# Patient Record
Sex: Female | Born: 1955 | Race: White | Hispanic: No | Marital: Single | Smoking: Former smoker
Health system: Southern US, Community
[De-identification: ages and names within clinical notes are randomized; demographics above are authoritative.]

## PROBLEM LIST (undated history)

## (undated) DIAGNOSIS — F419 Anxiety disorder, unspecified: Secondary | ICD-10-CM

## (undated) DIAGNOSIS — M1990 Unspecified osteoarthritis, unspecified site: Secondary | ICD-10-CM

## (undated) DIAGNOSIS — R519 Headache, unspecified: Secondary | ICD-10-CM

## (undated) DIAGNOSIS — E7800 Pure hypercholesterolemia, unspecified: Secondary | ICD-10-CM

## (undated) DIAGNOSIS — Z8739 Personal history of other diseases of the musculoskeletal system and connective tissue: Secondary | ICD-10-CM

## (undated) DIAGNOSIS — R232 Flushing: Secondary | ICD-10-CM

## (undated) DIAGNOSIS — G8929 Other chronic pain: Secondary | ICD-10-CM

## (undated) DIAGNOSIS — R51 Headache: Secondary | ICD-10-CM

## (undated) DIAGNOSIS — R079 Chest pain, unspecified: Secondary | ICD-10-CM

## (undated) DIAGNOSIS — C801 Malignant (primary) neoplasm, unspecified: Secondary | ICD-10-CM

## (undated) DIAGNOSIS — F32A Depression, unspecified: Secondary | ICD-10-CM

## (undated) DIAGNOSIS — I219 Acute myocardial infarction, unspecified: Secondary | ICD-10-CM

## (undated) DIAGNOSIS — K219 Gastro-esophageal reflux disease without esophagitis: Secondary | ICD-10-CM

## (undated) DIAGNOSIS — D649 Anemia, unspecified: Secondary | ICD-10-CM

## (undated) DIAGNOSIS — F329 Major depressive disorder, single episode, unspecified: Secondary | ICD-10-CM

## (undated) DIAGNOSIS — M545 Low back pain, unspecified: Secondary | ICD-10-CM

## (undated) DIAGNOSIS — Z720 Tobacco use: Secondary | ICD-10-CM

## (undated) HISTORY — DX: Depression, unspecified: F32.A

## (undated) HISTORY — DX: Flushing: R23.2

## (undated) HISTORY — DX: Major depressive disorder, single episode, unspecified: F32.9

## (undated) HISTORY — DX: Anxiety disorder, unspecified: F41.9

## (undated) HISTORY — DX: Gastro-esophageal reflux disease without esophagitis: K21.9

## (undated) HISTORY — PX: TONSILLECTOMY: SUR1361

## (undated) HISTORY — DX: Pure hypercholesterolemia, unspecified: E78.00

## (undated) HISTORY — PX: OVARIAN CYST REMOVAL: SHX89

---

## 1999-10-01 ENCOUNTER — Other Ambulatory Visit: Admission: RE | Admit: 1999-10-01 | Discharge: 1999-10-01 | Payer: Self-pay | Admitting: Gynecology

## 1999-10-29 ENCOUNTER — Encounter: Payer: Self-pay | Admitting: Surgery

## 1999-10-30 ENCOUNTER — Encounter: Admission: RE | Admit: 1999-10-30 | Discharge: 1999-10-30 | Payer: Self-pay | Admitting: Surgery

## 1999-10-30 ENCOUNTER — Ambulatory Visit (HOSPITAL_COMMUNITY): Admission: RE | Admit: 1999-10-30 | Discharge: 1999-10-30 | Payer: Self-pay | Admitting: Surgery

## 1999-10-30 ENCOUNTER — Encounter: Payer: Self-pay | Admitting: Surgery

## 1999-10-30 ENCOUNTER — Other Ambulatory Visit: Admission: RE | Admit: 1999-10-30 | Discharge: 1999-10-30 | Payer: Self-pay | Admitting: Surgery

## 1999-10-30 ENCOUNTER — Encounter (INDEPENDENT_AMBULATORY_CARE_PROVIDER_SITE_OTHER): Payer: Self-pay | Admitting: Specialist

## 1999-12-30 ENCOUNTER — Ambulatory Visit (HOSPITAL_COMMUNITY): Admission: RE | Admit: 1999-12-30 | Discharge: 1999-12-30 | Payer: Self-pay | Admitting: Gastroenterology

## 2000-01-19 ENCOUNTER — Ambulatory Visit (HOSPITAL_COMMUNITY): Admission: RE | Admit: 2000-01-19 | Discharge: 2000-01-19 | Payer: Self-pay | Admitting: Gynecology

## 2000-01-19 ENCOUNTER — Encounter (INDEPENDENT_AMBULATORY_CARE_PROVIDER_SITE_OTHER): Payer: Self-pay | Admitting: Specialist

## 2000-11-02 ENCOUNTER — Encounter: Payer: Self-pay | Admitting: Surgery

## 2000-11-02 ENCOUNTER — Encounter: Admission: RE | Admit: 2000-11-02 | Discharge: 2000-11-02 | Payer: Self-pay | Admitting: Surgery

## 2002-08-02 ENCOUNTER — Other Ambulatory Visit: Admission: RE | Admit: 2002-08-02 | Discharge: 2002-08-02 | Payer: Self-pay | Admitting: Gynecology

## 2003-08-02 ENCOUNTER — Encounter: Admission: RE | Admit: 2003-08-02 | Discharge: 2003-08-02 | Payer: Self-pay | Admitting: Gynecology

## 2003-08-06 ENCOUNTER — Other Ambulatory Visit: Admission: RE | Admit: 2003-08-06 | Discharge: 2003-08-06 | Payer: Self-pay | Admitting: Gynecology

## 2004-01-29 ENCOUNTER — Other Ambulatory Visit: Admission: RE | Admit: 2004-01-29 | Discharge: 2004-01-29 | Payer: Self-pay | Admitting: Gynecology

## 2004-08-07 ENCOUNTER — Other Ambulatory Visit: Admission: RE | Admit: 2004-08-07 | Discharge: 2004-08-07 | Payer: Self-pay | Admitting: Gynecology

## 2004-08-11 ENCOUNTER — Encounter: Admission: RE | Admit: 2004-08-11 | Discharge: 2004-08-11 | Payer: Self-pay | Admitting: Gynecology

## 2005-08-09 ENCOUNTER — Other Ambulatory Visit: Admission: RE | Admit: 2005-08-09 | Discharge: 2005-08-09 | Payer: Self-pay | Admitting: Gynecology

## 2006-08-12 ENCOUNTER — Other Ambulatory Visit: Admission: RE | Admit: 2006-08-12 | Discharge: 2006-08-12 | Payer: Self-pay | Admitting: Gynecology

## 2006-08-24 ENCOUNTER — Encounter: Admission: RE | Admit: 2006-08-24 | Discharge: 2006-08-24 | Payer: Self-pay | Admitting: Gynecology

## 2007-08-23 ENCOUNTER — Other Ambulatory Visit: Admission: RE | Admit: 2007-08-23 | Discharge: 2007-08-23 | Payer: Self-pay | Admitting: Gynecology

## 2007-08-25 ENCOUNTER — Encounter: Admission: RE | Admit: 2007-08-25 | Discharge: 2007-08-25 | Payer: Self-pay | Admitting: Gynecology

## 2008-05-31 DIAGNOSIS — I219 Acute myocardial infarction, unspecified: Secondary | ICD-10-CM

## 2008-05-31 HISTORY — DX: Acute myocardial infarction, unspecified: I21.9

## 2008-08-26 ENCOUNTER — Encounter: Admission: RE | Admit: 2008-08-26 | Discharge: 2008-08-26 | Payer: Self-pay | Admitting: Gynecology

## 2008-09-04 ENCOUNTER — Other Ambulatory Visit: Admission: RE | Admit: 2008-09-04 | Discharge: 2008-09-04 | Payer: Self-pay | Admitting: Gynecology

## 2008-09-04 ENCOUNTER — Encounter: Payer: Self-pay | Admitting: Gynecology

## 2008-09-04 ENCOUNTER — Ambulatory Visit: Payer: Self-pay | Admitting: Gynecology

## 2008-09-10 ENCOUNTER — Ambulatory Visit: Payer: Self-pay | Admitting: Gynecology

## 2008-10-14 ENCOUNTER — Observation Stay (HOSPITAL_COMMUNITY): Admission: EM | Admit: 2008-10-14 | Discharge: 2008-10-15 | Payer: Self-pay | Admitting: Emergency Medicine

## 2008-10-15 HISTORY — PX: CARDIAC CATHETERIZATION: SHX172

## 2008-12-27 ENCOUNTER — Emergency Department (HOSPITAL_COMMUNITY): Admission: EM | Admit: 2008-12-27 | Discharge: 2008-12-27 | Payer: Self-pay | Admitting: Emergency Medicine

## 2009-09-05 ENCOUNTER — Other Ambulatory Visit: Admission: RE | Admit: 2009-09-05 | Discharge: 2009-09-05 | Payer: Self-pay | Admitting: Gynecology

## 2009-09-05 ENCOUNTER — Ambulatory Visit: Payer: Self-pay | Admitting: Gynecology

## 2009-09-05 ENCOUNTER — Encounter: Admission: RE | Admit: 2009-09-05 | Discharge: 2009-09-05 | Payer: Self-pay | Admitting: Emergency Medicine

## 2009-12-16 ENCOUNTER — Encounter: Admission: RE | Admit: 2009-12-16 | Discharge: 2009-12-16 | Payer: Self-pay | Admitting: Emergency Medicine

## 2010-05-09 ENCOUNTER — Encounter
Admission: RE | Admit: 2010-05-09 | Discharge: 2010-05-09 | Payer: Self-pay | Source: Home / Self Care | Attending: Emergency Medicine | Admitting: Emergency Medicine

## 2010-06-21 ENCOUNTER — Encounter: Payer: Self-pay | Admitting: Emergency Medicine

## 2010-08-13 ENCOUNTER — Other Ambulatory Visit: Payer: Self-pay | Admitting: Emergency Medicine

## 2010-08-13 DIAGNOSIS — Z1231 Encounter for screening mammogram for malignant neoplasm of breast: Secondary | ICD-10-CM

## 2010-09-06 LAB — URINALYSIS, ROUTINE W REFLEX MICROSCOPIC
Leukocytes, UA: NEGATIVE
Protein, ur: NEGATIVE mg/dL
Urobilinogen, UA: 0.2 mg/dL (ref 0.0–1.0)

## 2010-09-06 LAB — CBC
HCT: 39.3 % (ref 36.0–46.0)
MCHC: 35.1 g/dL (ref 30.0–36.0)
MCV: 99.5 fL (ref 78.0–100.0)
Platelets: 235 10*3/uL (ref 150–400)
RBC: 3.95 MIL/uL (ref 3.87–5.11)
RDW: 12.4 % (ref 11.5–15.5)

## 2010-09-06 LAB — URINE MICROSCOPIC-ADD ON

## 2010-09-06 LAB — DIFFERENTIAL
Basophils Relative: 1 % (ref 0–1)
Eosinophils Absolute: 0.1 10*3/uL (ref 0.0–0.7)
Monocytes Relative: 5 % (ref 3–12)
Neutro Abs: 5.9 10*3/uL (ref 1.7–7.7)
Neutrophils Relative %: 74 % (ref 43–77)

## 2010-09-06 LAB — LIPASE, BLOOD: Lipase: 40 U/L (ref 11–59)

## 2010-09-06 LAB — PROTIME-INR: INR: 1 (ref 0.00–1.49)

## 2010-09-06 LAB — TROPONIN I: Troponin I: 0.01 ng/mL (ref 0.00–0.06)

## 2010-09-08 ENCOUNTER — Ambulatory Visit: Payer: Self-pay

## 2010-09-08 ENCOUNTER — Encounter: Payer: Self-pay | Admitting: Gynecology

## 2010-09-08 LAB — POCT CARDIAC MARKERS
Myoglobin, poc: 33.4 ng/mL (ref 12–200)
Troponin i, poc: <0.05 ng/mL (ref 0.00–0.09)

## 2010-09-08 LAB — BASIC METABOLIC PANEL
Calcium: 9 mg/dL (ref 8.4–10.5)
Chloride: 104 mEq/L (ref 96–112)
Creatinine, Ser: 0.66 mg/dL (ref 0.4–1.2)
GFR calc Af Amer: >60 mL/min (ref >60)
GFR calc non Af Amer: >60 mL/min (ref >60)
Potassium: 3.5 mEq/L (ref 3.5–5.1)

## 2010-09-08 LAB — COMPREHENSIVE METABOLIC PANEL
ALT: 20 U/L (ref 0–35)
Albumin: 3.2 g/dL — ABNORMAL LOW (ref 3.5–5.2)
BUN: 5 mg/dL — ABNORMAL LOW (ref 6–23)
Chloride: 107 mEq/L (ref 96–112)
Creatinine, Ser: 0.66 mg/dL (ref 0.4–1.2)
GFR calc non Af Amer: >60 mL/min (ref >60)
Potassium: 3.8 mEq/L (ref 3.5–5.1)
Total Bilirubin: 0.8 mg/dL (ref 0.3–1.2)
Total Protein: 5.9 g/dL — ABNORMAL LOW (ref 6.0–8.3)

## 2010-09-08 LAB — LIPID PANEL
Cholesterol: 224 mg/dL — ABNORMAL HIGH (ref 0–200)
HDL: 45 mg/dL (ref >39)
LDL Cholesterol: 142 mg/dL — ABNORMAL HIGH (ref 0–99)
Total CHOL/HDL Ratio: 5 RATIO

## 2010-09-08 LAB — DIFFERENTIAL
Basophils Absolute: 0 10*3/uL (ref 0.0–0.1)
Basophils Relative: 1 % (ref 0–1)
Eosinophils Absolute: 0.1 10*3/uL (ref 0.0–0.7)
Eosinophils Relative: 2 % (ref 0–5)
Lymphocytes Relative: 23 % (ref 12–46)
Monocytes Relative: 8 % (ref 3–12)
Neutrophils Relative %: 67 % (ref 43–77)

## 2010-09-08 LAB — CBC
HCT: 36.4 % (ref 36.0–46.0)
Hemoglobin: 12.8 g/dL (ref 12.0–15.0)
MCV: 100.3 fL — ABNORMAL HIGH (ref 78.0–100.0)

## 2010-09-08 LAB — MAGNESIUM: Magnesium: 2.1 mg/dL (ref 1.5–2.5)

## 2010-09-08 LAB — CK TOTAL AND CKMB (NOT AT ARMC): Total CK: 60 U/L (ref 7–177)

## 2010-09-08 LAB — CARDIAC PANEL(CRET KIN+CKTOT+MB+TROPI)
CK, MB: 0.8 ng/mL (ref 0.3–4.0)
Relative Index: INVALID (ref 0.0–2.5)
Troponin I: 0.01 ng/mL (ref 0.00–0.06)

## 2010-09-08 LAB — APTT: aPTT: 30 seconds (ref 24–37)

## 2010-09-08 LAB — TSH: TSH: 2.025 u[IU]/mL (ref 0.350–4.500)

## 2010-09-15 ENCOUNTER — Ambulatory Visit
Admission: RE | Admit: 2010-09-15 | Discharge: 2010-09-15 | Disposition: A | Discharge status: Home / Self Care | Payer: BLUE CROSS/BLUE SHIELD | Source: Ambulatory Visit | Attending: Emergency Medicine | Admitting: Emergency Medicine

## 2010-09-15 DIAGNOSIS — Z1231 Encounter for screening mammogram for malignant neoplasm of breast: Secondary | ICD-10-CM

## 2010-09-21 ENCOUNTER — Other Ambulatory Visit: Payer: Self-pay | Admitting: Gynecology

## 2010-09-21 ENCOUNTER — Encounter (INDEPENDENT_AMBULATORY_CARE_PROVIDER_SITE_OTHER): Payer: BLUE CROSS/BLUE SHIELD | Admitting: Gynecology

## 2010-09-21 ENCOUNTER — Other Ambulatory Visit (HOSPITAL_COMMUNITY)
Admission: RE | Admit: 2010-09-21 | Discharge: 2010-09-21 | Disposition: A | Discharge status: Home / Self Care | Payer: BLUE CROSS/BLUE SHIELD | Source: Ambulatory Visit | Attending: Gynecology | Admitting: Gynecology

## 2010-09-21 DIAGNOSIS — Z01419 Encounter for gynecological examination (general) (routine) without abnormal findings: Secondary | ICD-10-CM

## 2010-09-21 DIAGNOSIS — Z124 Encounter for screening for malignant neoplasm of cervix: Secondary | ICD-10-CM | POA: Insufficient documentation

## 2010-09-21 DIAGNOSIS — R823 Hemoglobinuria: Secondary | ICD-10-CM

## 2010-10-13 NOTE — Cardiovascular Report (Signed)
NAME:  Beth Rice, Beth Rice              ACCOUNT NO.:  [id number]   MEDICAL RECORD NO.:  [id number]          PATIENT TYPE:  INP   LOCATION:  2506                         FACILITY:  [facility]   PHYSICIAN:  Richard A. Alanda Amass, M.D.DATE OF BIRTH:  Jul 16, 1955   DATE OF PROCEDURE:  10/15/2008  DATE OF DISCHARGE:  10/15/2008                            CARDIAC CATHETERIZATION   PROCEDURE:  Retrograde central aortic catheterization, selective  coronary angiography by Judkins technique, pre and post IC nitroglycerin  administration, LV angiogram in the RAO and LAO projection, abdominal  aortic angiogram, hand injection.   PROCEDURE IN DETAIL:  The patient was brought to the second floor of [facility] in a postabsorptive state after premedication with 5 mg Valium p.o.  She was given 2 mg of Versed for sedation in the laboratory.  She is  allergic to XYLOCAINE with a past history of rash, so chloroprocaine  Nesacaine was used for local anesthesia and no untoward reactions.  The CRFA was entered with anterior puncture using 18 thin-wall needle  with modified Seldinger technique and is angled J-tip Teflon-coated  guidewire, a 5-French short side-arm sheaths were inserted without  difficulty.  Diagnostic coronary angiography was done with 5-French, 4-  cm taper SciMed preformed coronary catheters.  IC nitroglycerin 200 mcg  was given into the left coronary artery with repeat injections obtained.  LV angiogram was done in the RAO and LAO projection at 25 mL and 14  mL/sec and 20 mL and 12 mL respectively.  Pullback pressure to the CA  was performed and showed no gradient across the aortic valve.  The  catheter was brought down above the level of the renal arteries and hand  injection was done demonstrating widely patent single normal renal  arteries with no FMD or stenosis present and immediately normal  infrarenal abdominal aorta.  Catheters were removed.  Side-arm sheath  was flushed and the patient was  brought to the holding area for sheath  removal and pressure hemostasis.   Informed consent was obtained prior to the procedure and also informed  consent was obtained from the patient verbally for audio and videotaping  of this procedure which she was fully agreeable to.  The patient  tolerated the procedure well.   PRESSURES:  LV:  150/0; LVEDP 14 mmHg.  CA: 115/60 mmHg.   There was no gradient across the aortic valve on catheter pullback.   Fluoroscopy did not reveal any coronary, aortic, or valvular  calcification.   LV angiogram demonstrated normally contracting left ventricle with no  wall motion abnormality.  EF greater or equal to 60%.  No mitral  regurgitation or MVP was seen.   The main left coronary artery was normal and large.   Left anterior descending artery course to the apex of the heart where it  bifurcated.  It was widely patent and smooth throughout the proximal  half up to a large second diagonal branch.  The second diagonal branch  trifurcated and was a moderate size vessel and normal.  After the second  diagonal, there was an eccentric, smooth approximately 50% or less  narrowing, mildly segmental beyond DX2 with several septal perforators  coming from this area.  Multiple views and post nitroglycerin did not  show any high-grade disease and there was normal flow throughout this  area.   There was a second lesion at the junction of the mid and distal third of  approximately 40% eccentric narrowing that was also smooth with good  flow to the distal LAD.   The circumflex was nondominant.  There was a moderate size OM1 that was  normal.  A small OM2 normal and the distal circumflex was small,  and  the LAD branch was normal.   Right coronary was a large dominant vessel, was widely patent and  smooth.  There was approximately 20% smooth concentric narrowing in the  mid portion beyond the RV branch.  The distal PDA was large and normal.  The PLA bifurcated  and was normal.   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  She has no  children, has 4 dogs and has been a chronic cigarette smoker.  She has  had recent stress at work along with stress with her mother dying  recently.  She has a history of hyperlipidemia along with cigarette  abuse and had an exercise Myoview in our office March 22, 2007, that  was negative for ischemia.  Holter monitor done because of PVC showed  sinus node variability, some wandering atrial pacemaker, and some runs  of possible inappropriate sinus tachycardia with no other arrhythmia.  She is admitted to the hospital on Oct 14, 2008, with sharp chest  discomfort, intermittent mid chest, radiating to the left shoulder.  There were no acute EKG changes, myocardial infarction was ruled out by  serial enzymes and EKGs and Dr. Tresa Endo felt that diagnostic angiography  was indicated in the setting.  The patient was agreeable to this.  Fortunately, catheterization shows noncritical coronary disease with  smooth 50% narrowing of the LAD beyond the DX-2 and 40% narrowing of the  distal third of the LAD 20% mid right coronary narrowing.  Normal LV  function.  No significant coronary calcification.   We recommended that the patient be continued on medical therapies,  smoking cessation attempts, instituted addition of statins to her  therapy and low-dose beta-blocker and aspirin as an antiplatelet agent.  She will be followed up with Dr. Tresa Endo as an outpatient.  She was also  reassured about the status of her coronary anatomy.   CATHETERIZATION DIAGNOSES:  1. Chest pain, etiology not determined.  2. Noncritical mid and distal third left anterior descending disease,      smooth as outlined above.  3. Normal left ventricular function.  4. Cigarette abuse.  5. Anxiety and recent depression.  6. Allergy to NOVOCAIN, PENICILLIN, and  SULFA.  7. Hyperlipidemia.  8. Possible gastroesophageal reflux disease.      Richard A. Alanda Amass, M.D.  Electronically Signed     RAW/MEDQ  D:  10/15/2008  T:  10/16/2008  Job:  [id number]   cc:   Nicki Guadalajara, M.D.  [facility]

## 2010-10-13 NOTE — H&P (Signed)
NAME:  Beth Rice, Beth Rice NO.:  [id number]   MEDICAL RECORD NO.:  [id number]          PATIENT TYPE:  EMS   LOCATION:  MAJO                         FACILITY:  [facility]   PHYSICIAN:  Nicki Guadalajara, M.D.     DATE OF BIRTH:  [birth year]   DATE OF ADMISSION:  12/27/2008  DATE OF DISCHARGE:                              HISTORY & PHYSICAL   Beth Rice is a pleasant 55 year old female who is referred to [place] by  Dr. [facility] for chest pain.  We had seen her back in  May of this year for chest pain.  At that time, her symptoms were sharp  epigastric pain.  She did have risk factors for coronary disease and  underwent diagnostic catheterization by Dr. Alanda Amass which revealed a  20% RCA and a 50% mid LAD.  She improved and was discharged and 3 weeks  ago started having chest pain again.  She says this chest pain somewhat  different.  It usually comes on at work and is described as chest  tightness.  She says occasionally it goes to her jaw.  She says  sometimes she feels short of breath with it.  She says it may last for  hours.  She has not noticed it outside of work.  She said last weekend  she was able to cut the grass without problems.  She has some  generalized fatigue.  She says occasionally antacids help her symptoms.   PAST MEDICAL HISTORY:  Remarkable for treated dyslipidemia.  She has had  no major surgeries.   CURRENT MEDICATIONS:  1. Zoloft 50 mg a day.  2. Xanax p.r.n.  3. Aspirin daily.  4. Crestor 10 mg a day.  5. Protonix 40 mg a day.   ALLERGIES:  1. ABILIFY.  2. PENICILLIN.  3. SULFA.   SOCIAL HISTORY:  She is single, she smokes less than half pack a day  back in May but says she does not smoke now.  She works at [employer] and says she has a stressful job.  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   FAMILY HISTORY:  Unremarkable for coronary disease.  Her father died of  an aneurysm in his 30s.  Mother died at 82 of Alzheimer's.   REVIEW OF SYSTEMS:  She says she has had generalized fatigue as noted  above.  She has had some belching which she says is new for her.  She  says her job is very stressful recently because of some office politics.   PHYSICAL EXAMINATION:  VITAL SIGNS:  Blood pressure is 132/72, pulse 84,  temperature 98.  GENERAL:  She is a well-developed, well-nourished female in no acute  distress.  HEENT:  Normocephalic.  Extraocular movements are intact.  Sclerae  nonicteric.  She wears glasses.  NECK:  Without JVD or bruit.  Thyroid is not enlarged.  CHEST:  Clear to auscultation and percussion.  CARDIAC:  Reveals  regular rate and rhythm without murmur, rub or gallop.  Normal S1 and S2.  ABDOMEN:  Nontender.  No hepatosplenomegaly.  EXTREMITIES:  Without edema.  Posterior tibial pulses are 2+/4  bilaterally.  NEURO:  Exam is grossly intact.  She is awake, alert and oriented,  cooperative.  Moves all extremities without obvious deficit.  SKIN:  Cool and dry.   EKG shows sinus rhythm without acute changes.  Labs are pending.   IMPRESSION:  1. Chest pain, atypical for angina.  2. Coronary disease with 50% LAD in May 2010.  3. Treated dyslipidemia.  4. Past history of smoking.  5. Stress.   PLAN:  The patient was seen by Dr. Clarene Duke and myself in the emergency  room.  Dr. Clarene Duke feels that her symptoms are most likely related to  work stress.  We will check her enzymes and, if negative, we will  discharge her home.  He did speak with Dr. Cleta Alberts over the phone and we  will go ahead and add Klonopin 1 mg b.i.d. to her current medications.  Dr. Cleta Alberts will see her in followup next week.      Beth Rice, P.A.    ______________________________  Nicki Guadalajara, M.D.    Lenard Lance  D:  12/27/2008  T:  12/27/2008  Job:  [id number]   cc:   Brett Canales A. Cleta Alberts, M.D.

## 2010-10-16 NOTE — Procedures (Signed)
[place]  Patient:    Beth Rice, Beth Rice                     MRN: [id number] Proc. Date: 12/30/99 Adm. Date:  [id number] Attending:  Charna Elizabeth CC:         Timothy P. Fontaine, M.D.   Procedure Report  DATE OF BIRTH:  20-May-1956  REFERRING PHYSICIAN:  Nadyne Coombes. Fontaine, M.D.  PROCEDURE PERFORMED:  Colonoscopy.  ENDOSCOPIST:  Anselmo Rod, M.D.  INSTRUMENT USED:  Olympus video colonoscope.  INDICATIONS:  A 55 year old white female with a history of rectal bleeding. Rule out colonic polyps, masses, hemorrhoids, etc.  Patient has also had some abdominal pain.  PREPROCEDURE PREPARATION:  Informed consent was procured from the patient. The patient was fasted for 8 hours prior to the procedure and prepped with a bottle of magnesium citrate and a gallon of NuLytely the night prior to the procedure.  PREPROCEDURE PHYSICAL:  Patient has stable vital signs.  NECK: Supple.  CHEST:  Clear to auscultation. S1, S2 regular.  ABDOMEN:  Soft with normal abdominal bowel sounds.  DESCRIPTION OF PROCEDURE:  The patient was placed in left lateral decubitus position and sedated with 80 mg of Demerol and 8 mg of Versed intravenously. Once the patient was adequately sedated and maintained on low-flow oxygen and continuous cardiac monitoring, the Olympus video colonoscope was advanced from the rectum to the cecum without difficulty.  There was some solid stool in the right colon.  No masses, polyps, erosions, ulcerations or diverticula were seen. There were small internal hemorrhoids seen on retroflexion and a small external hemorrhoid seen on anal inspection.  The patient tolerated the procedure well without complication.  IMPRESSION: 1. Normal colonoscopy up to the cecum, except for a small amount of residual    stool in the right colon. 2. Small, nonbleeding, internal hemorrhoids and a small external hemorrhoid.  RECOMMENDATIONS: 1. The  patient has been advised to increase the fluid and fiber in her diet. 2. Outpatient follow-up was advised in the next two weeks. DD:  12/30/99 TD:  12/31/99 Job: [id number]

## 2010-10-16 NOTE — Op Note (Signed)
[facility]  Patient:    Beth Rice, Beth Rice                     MRN: [id number] Proc. Date: 01/19/00 Adm. Date:  [id number] Attending:  Merrily Pew                           Operative Report  PREOPERATIVE DIAGNOSES:       1. Abdominal pain.                               2. Endometrial polyps.  POSTOPERATIVE DIAGNOSES:      1. Endometrial polyps.                               2. Endometriosis.  OPERATION:                    1. Hysteroscopy resection endometrial polyps.                               2. Laparoscopic biopsy excision and ablation                                  endometriosis.  SURGEON:                      Timothy P. Fontaine, M.D.  ASSISTANT:  ANESTHESIA:                   General anesthesia.  ESTIMATED BLOOD LOSS:         Minimal.  SORBITOL DISCREPANCY:         Minimal.  COMPLICATIONS:                None.  SPECIMEN:                     1. Ovarian biopsy.                               2. Peritoneal biopsy.                               3. Endometrial polyp.                               4. Uterine curettage.  FINDINGS:                     Hysteroscopy with polypoid sessile-type growth anterior uterine surface excised separately with multiple undulating endometrial protrusions consistent with hyperplastic changes versus small polyps.  At the end of the procedure after sharp curettage, no abnormalities left with an empty cavity.  Right and left tubal ostia visualized. The fundus, anterior and posterior uterine surfaces, lower uterine segment and the cervical all normal.  Laparoscopy with anterior cul-de-sac normal, posterior cul-de-sac normal.  Uterus normal size.  Right and left fallopian tubes normal length, caliber, fimbriated ends.  Right ovary grossly normal.  Left ovary with pinpoint dark lesion consistent with endometriosis.  This  was biopsied and subsequently fulgurated.  Left pelvic sidewall with fibrotic  area below ureter consistent with endometriosis, white lesion which was excised.  No other evidence of adhesions or endometriosis.  Liver smooth.  Gallbladder grossly normal.  Appendix grossly normal.  No evidence of intestinal adhesions.  DESCRIPTION OF PROCEDURE:     The patient was taken to the operating room and underwent general endotracheal anesthesia.  She was placed in the low dorsal lithotomy position and received an abdominal perineal vaginal preparation with Betadine solution and as draped in the usual fashion.  The bladder was emptied with in and out Foley catheterization.  Examination under anesthesia performed and subsequently the cervix was grasped with a single-tooth tenaculum, gently dilated to admit the operative hysteroscope and hysteroscopy was performed with findings noted above.  Using the right angle resectoscopic loop, the most prominent polypoid growth from the anterior uterine surface was sharply excised at the level of the surrounding endometrium and this was sent as a separate specimen. There were no other definitive polyps noted, although significant undulating hyperplastic type pattern and a sharp curettage was subsequently performed without difficulty and with abundant return. Rehysteroscopy showed an empty cavity, good distention, no evidence of perforation and subsequently a Hulka tenaculum was placed and the surgeon and assistant regloved, and attention was turned to the laparoscopy.  A vertical infraumbilical incision was made.  The Verres needle was placed.  The position was verified with water and two liters of carbon dioxide gas infused without difficulty.  A 10 mm laparoscopic trocar was then placed without difficulty. Its position was verified visually.  A 5 mm suprapubic port was then placed under direct visualization after transillumination of the vessels without difficulty.  Examination of the pelvic organs and upper abdominal examination with  findings noted above.  The left pelvic sidewall with fibrotic peritoneal implant was then identified.  It was grasped with a self-retaining grasper and was elevated from the surrounding pelvic sidewall.  The ureter was identified and found to be away from this area and this abnormality was then sharply excised with the surrounding peritoneum without difficulty.  Several small peritoneal bleeders were bipolar cauterized without difficulty and again the attention to the ureter was given to avoid any injury.  The left ovary was then grasped and stabilized and a sharp biopsy over the pigmented area was achieved retaining some of the pigmented capsule and this was sent to pathology.  Any remaining pigmented area was then bipolar fulgurated in a brushing technique to ablate any remaining abnormality.  The ovary and pelvic sidewall were then irrigated.  Adequate hemostasis was visualized.  The left ureter again reinspected showing a normal peristaltic motion with normal caliber, again away from the peritoneal window that was created by the biopsy and subsequently the 5 mm port was removed, the gas allowed to escape.  All sites reinspected under low pressure situation showing adequate hemostasis and the infraumbilical port backed out under direct visualization showing adequate hemostasis and no evidence of hernia formation.  A 0 Vicryl interrupted subcutaneous fascial stitch was placed infraumbilically.  Both skin incisions were infiltrated using 0.25% Marcaine and subsequently closed using 3-0 Vicryl interrupted subcuticular stitch. The Hulka tenaculum was removed. Sterile dressings were placed on the incisions.  The patient was placed in the supine position, awakened without difficulty and taken to the recovery room in good condition having tolerated the procedure well. DD:  01/19/00 TD:  01/20/00 Job: [id number]

## 2010-10-16 NOTE — H&P (Signed)
[facility]  Patient:    Beth Rice, Beth Rice                     MRN: [id number] Adm. Date:  [id number] Disc. Date: [id number] Attending:  Charna Elizabeth                         History and Physical  CHIEF COMPLAINT:              Lower abdominal pain, abnormal sonohysterogram.  HISTORY OF PRESENT ILLNESS:   This 55 year old G 0 with a seven-month history of lower abdominal discomfort, sharp stabbing to aching midpelvis, almost on a daily constant basis.  The patient underwent a gastroenterology evaluation by Dr. Anselmo Rod, without significant findings.  Laboratory studies are unremarkable with a normal urinalysis and CBC.  No urinary symptoms or other constitutional symptoms.  She has regular menses.  No intermenstrual bleeding. She underwent an ultrasound, rule out disease, and was found to have a thickened endometrium with otherwise normal right and left ovaries.  She subsequently underwent a sonohysterogram which shows several defects within the endometrial cavity, consistent with endometrial polyps.  The patient is admitted at this time for a laser video laparoscopy, due to her pain and a hysteroscopy due to the polyps.  PAST MEDICAL HISTORY:         Unremarkable.  PAST SURGICAL HISTORY:        Includes a laparoscopy x 2 in the past, the last in 1991, with findings of benign functional ovarian cyst.  A tonsillectomy in 1956.  ALLERGIES:                    PENICILLIN.  CURRENT MEDICATIONS:          Over-the-counter ibuprofen.  REVIEW OF SYSTEMS:            Noncontributory.  FAMILY HISTORY:               Noncontributory.  SOCIAL HISTORY:               Significant for cigarettes and occasional alcohol use.  PHYSICAL EXAMINATION:  HEENT:                        Normal.  LUNGS:                        Clear.  CARDIAC:                      A regular rate without rubs, murmurs, or gallops.  ABDOMEN:                      Benign.  PELVIC:                        External, BUS, vagina normal.  Cervix normal. Uterus grossly normal size, nontender.  Adnexa without masses or tenderness.  RECTOVAGINAL:                 Examination is normal.  Stool is guaiac negative.  ASSESSMENT:                   A 55 year old gravida 0, with a seven-month  history of almost daily pelvic pain, sharp                               and stabbing to aching.  No other associated                               symptoms, status post gastrointestinal                               evaluation to include colonoscopy which was                               negative, for a laser video laparoscopy                               and hysteroscopy, due to endometrial defects                               consistent with polyps.  The risks, benefits, indications, and alternatives for the procedures were discussed.  I reviewed the expected intraoperative and postoperative courses, instrumentation to include the use of the resectoscope, laparoscope, sharp and blunt dissection, electrocautery, and use of laser.  The risks were discussed, to include uterine perforation and damage to internal organs, either through the laparoscope or from the hysteroscope, damaging bowel, bladder, ureters, vessels, and nerves, necessitating major exploratory reparative surgeries, and future reparative surgeries including ostomy formation.  The risks of vascular injury leading to hemorrhage, necessitating transfusion, and the risks of transfusion were reviewed, to include transfusion reaction, hepatitis, and AIDS.  The risks of infection both uterine and peritoneal, as well as incision, requiring prolonged antibiotics, as well as opening and draining of incisions was discussed, understood, and accepted.  No guarantees as far as pain relief were made.  The patient understands that her pain may persist, worsen, or change following the procedure, and she  understands and accepts this.  The patients questions were answered to her satisfaction.  She is ready to proceed with surgery. DD:  01/13/00 TD:  01/13/00 Job: [id number]

## 2011-08-23 ENCOUNTER — Other Ambulatory Visit: Payer: Self-pay | Admitting: *Deleted

## 2011-08-28 ENCOUNTER — Telehealth: Payer: Self-pay

## 2011-08-28 NOTE — Telephone Encounter (Signed)
Pt claims Beth Rice in [place] sent [place] a request to refill her Klonopin on Tuesday but we have not responded. She is starting to have the shakes as she has been without her meds since Tuesday.

## 2011-08-28 NOTE — Telephone Encounter (Signed)
Was advised with last refill that she needed an OV for more. She has not followed up. No refills without OV.  Beth Rice

## 2011-08-29 ENCOUNTER — Other Ambulatory Visit: Payer: Self-pay

## 2011-08-29 MED ORDER — CLONAZEPAM 1 MG PO TABS: 1.0000 mg | ORAL_TABLET | Freq: Every evening | ORAL | Status: DC | PRN

## 2011-08-29 NOTE — Telephone Encounter (Signed)
Spoke with pt she will follow up on wed or thurs of this week with Dr. Cleta Alberts. Please call in a weeks worth to her pharamacy

## 2011-08-29 NOTE — Telephone Encounter (Signed)
PATIENT CALLED BACK TODAY REGARDING THE SAME MESSAGE. SHE IS REQUESTING A REFILL ON KLONOPIN 1MG . SHE IS HAVING WITHDRAWAL SYMPTOMS SUCH AS SHAKY & SWEATING. SHE CANNOT SLEEP. SHE WOULD LIKE THIS REFILL CALLED INTO HER PHARMACY AS SOON AS POSSIBLE. BEST PHONE [phone]     PHARMACY IS [facility] IN [place] [phone]   [facility]

## 2011-08-29 NOTE — Telephone Encounter (Signed)
ok to call in 1 week of medication.  Beth Rice

## 2011-08-29 NOTE — Telephone Encounter (Signed)
What is her follow up plan? SHe has been advised she needs an OV, and told with her last refill that we will not refill it again without an OV.  Luigi Stuckey

## 2011-08-30 NOTE — Telephone Encounter (Signed)
Done

## 2011-09-01 ENCOUNTER — Ambulatory Visit (INDEPENDENT_AMBULATORY_CARE_PROVIDER_SITE_OTHER): Payer: BLUE CROSS/BLUE SHIELD | Admitting: Emergency Medicine

## 2011-09-01 VITALS — BP 104/71 | HR 90 | Temp 99.0°F | Resp 16 | Ht 62.0 in | Wt 117.2 lb

## 2011-09-01 DIAGNOSIS — E789 Disorder of lipoprotein metabolism, unspecified: Secondary | ICD-10-CM

## 2011-09-01 DIAGNOSIS — E785 Hyperlipidemia, unspecified: Secondary | ICD-10-CM

## 2011-09-01 DIAGNOSIS — F329 Major depressive disorder, single episode, unspecified: Secondary | ICD-10-CM

## 2011-09-01 DIAGNOSIS — F341 Dysthymic disorder: Secondary | ICD-10-CM

## 2011-09-01 DIAGNOSIS — G4700 Insomnia, unspecified: Secondary | ICD-10-CM

## 2011-09-01 LAB — COMPREHENSIVE METABOLIC PANEL
ALT: 16 U/L (ref 0–35)
Albumin: 4.5 g/dL (ref 3.5–5.2)
Alkaline Phosphatase: 71 U/L (ref 39–117)
Glucose, Bld: 100 mg/dL — ABNORMAL HIGH (ref 70–99)
Potassium: 4 mEq/L (ref 3.5–5.3)
Sodium: 141 mEq/L (ref 135–145)
Total Bilirubin: 0.4 mg/dL (ref 0.3–1.2)
Total Protein: 7.2 g/dL (ref 6.0–8.3)

## 2011-09-01 LAB — POCT CBC
Granulocyte percent: 55.5 %G (ref 37–80)
HCT, POC: 40.2 % (ref 37.7–47.9)
Hemoglobin: 13 g/dL (ref 12.2–16.2)
Lymph, poc: 2.1 (ref 0.6–3.4)
MCHC: 32.3 g/dL (ref 31.8–35.4)
MPV: 7.1 fL (ref 0–99.8)
POC Granulocyte: 3.1 (ref 2–6.9)
POC MID %: 6.6 %M (ref 0–12)
RDW, POC: 12.9 %

## 2011-09-01 LAB — LIPID PANEL
LDL Cholesterol: 126 mg/dL — ABNORMAL HIGH (ref 0–99)
VLDL: 33 mg/dL (ref 0–40)

## 2011-09-01 MED ORDER — CLONAZEPAM 1 MG PO TABS: 1.0000 mg | ORAL_TABLET | Freq: Every evening | ORAL | Status: DC | PRN

## 2011-09-01 MED ORDER — FLUTICASONE PROPIONATE 50 MCG/ACT NA SUSP: 2.0000 | Freq: Every day | NASAL | Status: DC

## 2011-09-01 MED ORDER — SERTRALINE HCL 50 MG PO TABS: 50.0000 mg | ORAL_TABLET | Freq: Every day | ORAL | Status: DC

## 2011-09-01 MED ORDER — ROSUVASTATIN CALCIUM 5 MG PO TABS: 5.0000 mg | ORAL_TABLET | Freq: Every day | ORAL | Status: DC

## 2011-09-01 NOTE — Progress Notes (Signed)
  Subjective:    Patient ID: Beth Rice, female    DOB: 09-Apr-1956, 56 y.o.   MRN: [id number]  HPI patient states she had moved out of range for her. She ran out of her Klonopin this weekend and following that she did have withdrawal symptoms of shakiness palpitations myalgias. These are much improved now. She is not sure what she wants to do about her living situation. She is a home here she is living in her mother's home. And [place]    Review of Systems noncontributory except as relates to this illness     Objective:   Physical Exam HEENT exam is unremarkable neck supple chest clear heart regular rate no murmurs abdomen soft        Assessment & Plan:  Patient under treatment for allergies and high cholesterol as well as anxiety disorder with depression. She did have withdrawal symptoms when she stopped her Klonopin have refilled all her medications for 6 months.

## 2012-02-08 ENCOUNTER — Telehealth: Payer: Self-pay

## 2012-02-08 NOTE — Telephone Encounter (Signed)
Please mail a copy of her labs. Call patient cholesterol remains high. Continue Crestor at 5 mg a day. Repeat in 3 months. If Cholesterol is still elevated at that time increased to 10 mg a day. I have spoken to patient about her labs again, given her the numbers. She states she has been taking the crestor. She is due for repeat labs, is it okay to place lab only visit for her to repeat the lab values, or does she need eval also?

## 2012-02-08 NOTE — Telephone Encounter (Signed)
PT HAD A PE DONE IN April AND WOULD LIKE TO GO OVER HER RESULTS WITH SOMEONE. HASN'T HEARD FROM ANYONE PLEASE CALL [phone]

## 2012-02-08 NOTE — Telephone Encounter (Signed)
She needs an ov

## 2012-02-08 NOTE — Telephone Encounter (Signed)
Thanks, I have advised patient. She is going to come in October for this.

## 2012-02-23 ENCOUNTER — Ambulatory Visit (INDEPENDENT_AMBULATORY_CARE_PROVIDER_SITE_OTHER): Payer: BLUE CROSS/BLUE SHIELD | Admitting: Emergency Medicine

## 2012-02-23 ENCOUNTER — Ambulatory Visit: Payer: BLUE CROSS/BLUE SHIELD

## 2012-02-23 VITALS — BP 108/72 | HR 70 | Temp 98.4°F | Resp 18 | Ht 63.25 in | Wt 122.0 lb

## 2012-02-23 DIAGNOSIS — R51 Headache: Secondary | ICD-10-CM

## 2012-02-23 DIAGNOSIS — J019 Acute sinusitis, unspecified: Secondary | ICD-10-CM

## 2012-02-23 DIAGNOSIS — J329 Chronic sinusitis, unspecified: Secondary | ICD-10-CM

## 2012-02-23 MED ORDER — AZITHROMYCIN 250 MG PO TABS: ORAL_TABLET | ORAL | Status: DC

## 2012-02-23 MED ORDER — PREDNISONE 10 MG PO TABS: ORAL_TABLET | ORAL | Status: DC

## 2012-02-23 NOTE — Progress Notes (Signed)
  Subjective:    Patient ID: Beth Rice, female    DOB: 04-10-1956, 56 y.o.   MRN: [id number]  HPI pressure in left ear left sided facial pressure started Saturday patient states she has problems every year in the fall. She starts with nasal congestion followed by cough. She has had pain on the left side of her face but no purulent nasal drainage.    Review of Systems     Objective:   Physical Exam HEENT exam. Neck is supple. TMs are clear. Nose is congested throat is normal is clear to both auscultation and percussion. Cardiac exam is unremarkable to  [facility] reading (PRIMARY) by  Dr Cleta Alberts sinus films did not reveal any air fluid.        Assessment & Plan:  Check [name]' view to be sure she doesn't have an air-fluid level in left maxillary sinus. If it is clear we'll treat with combination of prednisone and antibiotics. We'll go ahead and treat with Z-Pak and prednisone taper

## 2012-03-19 ENCOUNTER — Other Ambulatory Visit: Payer: Self-pay | Admitting: *Deleted

## 2012-03-19 ENCOUNTER — Other Ambulatory Visit: Payer: Self-pay | Admitting: Emergency Medicine

## 2012-05-12 ENCOUNTER — Other Ambulatory Visit: Payer: Self-pay | Admitting: Physician Assistant

## 2012-06-19 ENCOUNTER — Other Ambulatory Visit: Payer: Self-pay | Admitting: Emergency Medicine

## 2012-06-20 ENCOUNTER — Ambulatory Visit (INDEPENDENT_AMBULATORY_CARE_PROVIDER_SITE_OTHER): Payer: BLUE CROSS/BLUE SHIELD | Admitting: Family Medicine

## 2012-06-20 VITALS — BP 117/77 | HR 78 | Temp 97.1°F | Resp 16 | Ht 62.0 in | Wt 123.0 lb

## 2012-06-20 DIAGNOSIS — G4700 Insomnia, unspecified: Secondary | ICD-10-CM

## 2012-06-20 DIAGNOSIS — R220 Localized swelling, mass and lump, head: Secondary | ICD-10-CM

## 2012-06-20 DIAGNOSIS — K047 Periapical abscess without sinus: Secondary | ICD-10-CM

## 2012-06-20 DIAGNOSIS — R221 Localized swelling, mass and lump, neck: Secondary | ICD-10-CM

## 2012-06-20 DIAGNOSIS — J0100 Acute maxillary sinusitis, unspecified: Secondary | ICD-10-CM

## 2012-06-20 MED ORDER — CLONAZEPAM 1 MG PO TABS: 1.0000 mg | ORAL_TABLET | Freq: Every evening | ORAL | Status: DC | PRN

## 2012-06-20 MED ORDER — CEFDINIR 300 MG PO CAPS: 300.0000 mg | ORAL_CAPSULE | Freq: Two times a day (BID) | ORAL | Status: DC

## 2012-06-20 NOTE — Progress Notes (Signed)
Patient ID: Beth Rice MRN: [id number], DOB: 04/02/1956, 57 y.o. Date of Encounter: 06/20/2012, 9:51 AM  Primary Physician: Lucilla Edin, MD  Chief Complaint:  Chief Complaint  Patient presents with  . Sinusitis  . Facial Swelling    left side of face    HPI: 57 y.o. year old female presents with 14 day history of nasal congestion, post nasal drip, sore throat, sinus pressure, and cough. Afebrile. No chills. Nasal congestion thick and green/yellow. Sinus pressure is the worst symptom. Cough is productive secondary to post nasal drip and not associated with time of day. Ears feel full, leading to sensation of muffled hearing. Has tried OTC cold preps without success. No GI complaints.   Left cheek is swelling overnight  No recent antibiotics, recent travels, or sick contacts   No leg trauma, sedentary periods, h/o cancer, or tobacco use.  Past Medical History  Diagnosis Date  . Allergy   . Anxiety      Home Meds: Prior to Admission medications   Medication Sig Start Date End Date Taking? Authorizing Provider  aspirin 81 MG tablet Take 81 mg by mouth daily.   Yes Historical Provider, MD  b complex vitamins capsule Take 1 capsule by mouth daily.   Yes Historical Provider, MD  clonazePAM (KLONOPIN) 1 MG tablet TAKE 1 TABLET BY MOUTH EVERY NIGHT AT BEDTIME AS NEEDED ANXIETY 06/19/12  Yes Collene Gobble, MD  fish oil-omega-3 fatty acids 1000 MG capsule Take 2 g by mouth daily.   Yes Historical Provider, MD  fluticasone (FLONASE) 50 MCG/ACT nasal spray Place 2 sprays into the nose daily. 09/01/11  Yes Collene Gobble, MD  loratadine (CLARITIN) 10 MG tablet Take 10 mg by mouth daily.   Yes Historical Provider, MD  Lysine 500 MG CAPS Take by mouth.   Yes Historical Provider, MD  Multiple Vitamins-Minerals (MULTIVITAMIN WITH MINERALS) tablet Take 1 tablet by mouth daily.   Yes Historical Provider, MD  rosuvastatin (CRESTOR) 5 MG tablet Take 1 tablet (5 mg total) by mouth daily.  09/01/11  Yes Collene Gobble, MD  sertraline (ZOLOFT) 50 MG tablet Take 1 tablet (50 mg total) by mouth daily. 09/01/11  Yes Collene Gobble, MD  azithromycin (ZITHROMAX) 250 MG tablet Take 2 tabs PO x 1 dose, then 1 tab PO QD x 4 days 02/23/12   Collene Gobble, MD  predniSONE (DELTASONE) 10 MG tablet Take 6 a day for one day 5 a day for one day for a day for one day 3 a day for one day and the day for one day one a day for one day 02/23/12   Collene Gobble, MD    Allergies:  Allergies  Allergen Reactions  . Penicillins Hives  . Sulfa Antibiotics Hives    History   Social History  . Marital Status: Single    Spouse Name: N/A    Number of Children: N/A  . Years of Education: N/A   Occupational History  . Not on file.   Social History Main Topics  . Smoking status: Current Every Day Smoker -- 1.0 packs/day    Types: Cigarettes  . Smokeless tobacco: Not on file  . Alcohol Use: Not on file  . Drug Use: Not on file  . Sexually Active: Not on file   Other Topics Concern  . Not on file   Social History Narrative  . No narrative on file     Review of Systems: Constitutional:  negative for chills, fever, night sweats or weight changes Cardiovascular: negative for chest pain or palpitations Respiratory: negative for hemoptysis, wheezing, or shortness of breath Abdominal: negative for abdominal pain, nausea, vomiting or diarrhea Dermatological: negative for rash Neurologic: negative for headache   Physical Exam: Blood pressure 117/77, pulse 78, temperature 97.1 F (36.2 C), temperature source Oral, resp. rate 16, height 5\' 2"  (1.575 m), weight 123 lb (55.792 kg), SpO2 97.00%., Body mass index is 22.50 kg/(m^2). General: Well developed, well nourished, in no acute distress. Head: Normocephalic, atraumatic, eyes without discharge, sclera non-icteric, nares are congested. Bilateral auditory canals clear, TM's are without perforation, pearly grey with reflective cone of light bilaterally.  Serous effusion bilaterally behind TM's. Maxillary sinus TTP. Oral cavity moist.   Posterior pharynx with post nasal drip and mild erythema. No peritonsillar abscess or tonsillar exudate. Neck: Supple. No thyromegaly. Full ROM. No lymphadenopathy. Lungs: Clear bilaterally to auscultation without wheezes, rales, or rhonchi. Breathing is unlabored.  Heart: RRR with S1 S2. No murmurs, rubs, or gallops appreciated. Msk:  Strength and tone normal for age. Extremities: No clubbing or cyanosis. No edema. Neuro: Alert and oriented X 3. Moves all extremities spontaneously. CNII-XII grossly in tact. Psych:  Responds to questions appropriately with a normal affect.     ASSESSMENT AND PLAN:  57 y.o. year old female with sinusitis with dental abscess - 1. Facial swelling  cefdinir (OMNICEF) 300 MG capsule  2. Sinusitis, acute maxillary  cefdinir (OMNICEF) 300 MG capsule  3. Insomnia  clonazePAM (KLONOPIN) 1 MG tablet  4. Dental abscess  cefdinir (OMNICEF) 300 MG capsule   Insomnia reviewed  -Tylenol/Motrin prn -Rest/fluids -RTC precautions -RTC 3-5 days if no improvement  Signed, Elvina Sidle, MD 06/20/2012 9:51 AM

## 2012-06-20 NOTE — Patient Instructions (Addendum)

## 2012-06-23 ENCOUNTER — Telehealth: Payer: Self-pay

## 2012-06-23 NOTE — Telephone Encounter (Signed)
Patient states that since taking cefdinir (OMNICEF) 300 MG capsule She has bloody stools.   CBN:  [phone]

## 2012-06-23 NOTE — Telephone Encounter (Signed)
This is not normal she needs to return to clinic. I called her to advise. She will come in tomorrow am. She will not take any more antibiotic.

## 2012-06-24 ENCOUNTER — Ambulatory Visit (INDEPENDENT_AMBULATORY_CARE_PROVIDER_SITE_OTHER): Payer: BLUE CROSS/BLUE SHIELD | Admitting: Emergency Medicine

## 2012-06-24 VITALS — BP 111/72 | HR 89 | Temp 98.7°F | Resp 18 | Ht 62.0 in | Wt 123.2 lb

## 2012-06-24 DIAGNOSIS — J018 Other acute sinusitis: Secondary | ICD-10-CM

## 2012-06-24 DIAGNOSIS — K921 Melena: Secondary | ICD-10-CM

## 2012-06-24 LAB — POCT CBC
HCT, POC: 44.9 % (ref 37.7–47.9)
Lymph, poc: 1.5 (ref 0.6–3.4)
MCH, POC: 32.3 pg — AB (ref 27–31.2)
MCHC: 31.6 g/dL — AB (ref 31.8–35.4)
MCV: 102.2 fL — AB (ref 80–97)
MID (cbc): 0.3 (ref 0–0.9)
POC LYMPH PERCENT: 29.8 %L (ref 10–50)
Platelet Count, POC: 344 10*3/uL (ref 142–424)
RDW, POC: 12.6 %
WBC: 5.1 10*3/uL (ref 4.6–10.2)

## 2012-06-24 MED ORDER — LEVOFLOXACIN 500 MG PO TABS: 500.0000 mg | ORAL_TABLET | Freq: Every day | ORAL | Status: AC

## 2012-06-24 MED ORDER — METRONIDAZOLE 500 MG PO TABS: 500.0000 mg | ORAL_TABLET | Freq: Three times a day (TID) | ORAL | Status: DC

## 2012-06-24 NOTE — Progress Notes (Signed)
[facility] [address], [place] [address] [phone]  Date:  06/24/2012   Name:  Beth Rice   DOB:  03-30-1956   MRN:  [id number]  PCP:  Lucilla Edin, MD    Chief Complaint: Follow-up   History of Present Illness:  Beth Rice is a 57 y.o. very pleasant female patient who presents with the following:  Patient seen 1/21 with sinusitis and put on Omnicef.  Says that on Thursday she developed bloody stool 3 times daily.  Stopped the antibiotic yesterday.  No abdominal pain, fever, chills, nausea or vomiting.  Normal appetite.  Still has sinus complaints of pressure, fullness in ears, purulent nasal discharge. No history of inflammatory bowel disease or other complaint.  There is no problem list on file for this patient.   Past Medical History  Diagnosis Date  . Allergy   . Anxiety     Past Surgical History  Procedure Date  . Appendectomy     History  Substance Use Topics  . Smoking status: Former Smoker -- 1.0 packs/day    Types: Cigarettes  . Smokeless tobacco: Not on file  . Alcohol Use: Yes     Comment: social    No family history on file.  Allergies  Allergen Reactions  . Penicillins Hives  . Sulfa Antibiotics Hives    Medication list has been reviewed and updated.  Current Outpatient Prescriptions on File Prior to Visit  Medication Sig Dispense Refill  . aspirin 81 MG tablet Take 81 mg by mouth daily.      Marland Kitchen azithromycin (ZITHROMAX) 250 MG tablet Take 2 tabs PO x 1 dose, then 1 tab PO QD x 4 days  6 tablet  0  . b complex vitamins capsule Take 1 capsule by mouth daily.      . clonazePAM (KLONOPIN) 1 MG tablet Take 1 tablet (1 mg total) by mouth at bedtime as needed for anxiety.  90 tablet  1  . fish oil-omega-3 fatty acids 1000 MG capsule Take 2 g by mouth daily.      . fluticasone (FLONASE) 50 MCG/ACT nasal spray Place 2 sprays into the nose daily.  16 g  11  . loratadine (CLARITIN) 10 MG tablet Take 10 mg by mouth  daily.      Marland Kitchen Lysine 500 MG CAPS Take by mouth.      . Multiple Vitamins-Minerals (MULTIVITAMIN WITH MINERALS) tablet Take 1 tablet by mouth daily.      . rosuvastatin (CRESTOR) 5 MG tablet Take 1 tablet (5 mg total) by mouth daily.  30 tablet  11  . sertraline (ZOLOFT) 50 MG tablet Take 1 tablet (50 mg total) by mouth daily.  30 tablet  11  . cefdinir (OMNICEF) 300 MG capsule Take 1 capsule (300 mg total) by mouth 2 (two) times daily.  20 capsule  0  . predniSONE (DELTASONE) 10 MG tablet Take 6 a day for one day 5 a day for one day for a day for one day 3 a day for one day and the day for one day one a day for one day  21 tablet  0    Review of Systems:  As per HPI, otherwise negative.    Physical Examination: Filed Vitals:   06/24/12 1048  BP: 111/72  Pulse: 89  Temp: 98.7 F (37.1 C)  Resp: 18   Filed Vitals:   06/24/12 1048  Height: 5\' 2"  (1.575 m)  Weight:  123 lb 3.2 oz (55.883 kg)   Body mass index is 22.53 kg/(m^2). Ideal Body Weight: Weight in (lb) to have BMI = 25: 136.4   GEN: WDWN, NAD, Non-toxic, A & O x 3 HEENT: Atraumatic, Normocephalic. Neck supple. No masses, No LAD. Ears and Nose: No external deformity. CV: RRR, No M/G/R. No JVD. No thrill. No extra heart sounds. PULM: CTA B, no wheezes, crackles, rhonchi. No retractions. No resp. distress. No accessory muscle use. ABD: S, NT, ND, +BS. No rebound. No HSM. EXTR: No c/c/e NEURO Normal gait.  PSYCH: Normally interactive. Conversant. Not depressed or anxious appearing.  Calm demeanor.    Assessment and Plan: Sinusitis Possible, but doubtful, C.dif.  Will start flagyl pending lab result Stop omnicef and start levaquin. Follow up with Dr Hulan Saas, Tessa Lerner, MD  Patient very resistant to any testing as she doesn't think it is necessary.  I reinforced the concern that C.dif is a potentially lethal infection and we must exclude it as the first step.  She does have a normal CBC and is not ill  appearing.  Will bring stool in when available.    Results for orders placed in visit on 06/24/12  POCT CBC      Component Value Range   WBC 5.1  4.6 - 10.2 K/uL   Lymph, poc 1.5  0.6 - 3.4   POC LYMPH PERCENT 29.8  10 - 50 %L   MID (cbc) 0.3  0 - 0.9   POC MID % 6.1  0 - 12 %M   POC Granulocyte 3.3  2 - 6.9   Granulocyte percent 64.1  37 - 80 %G   RBC 4.39  4.04 - 5.48 M/uL   Hemoglobin 14.2  12.2 - 16.2 g/dL   HCT, POC 16.1  09.6 - 47.9 %   MCV 102.2 (*) 80 - 97 fL   MCH, POC 32.3 (*) 27 - 31.2 pg   MCHC 31.6 (*) 31.8 - 35.4 g/dL   RDW, POC 04.5     Platelet Count, POC 344  142 - 424 K/uL   MPV 7.0  0 - 99.8 fL

## 2012-06-24 NOTE — Patient Instructions (Addendum)

## 2012-07-03 ENCOUNTER — Telehealth: Payer: Self-pay

## 2012-07-05 NOTE — Telephone Encounter (Signed)
Opened in error

## 2012-07-28 ENCOUNTER — Encounter: Payer: Self-pay | Admitting: Gynecology

## 2012-07-31 ENCOUNTER — Ambulatory Visit (INDEPENDENT_AMBULATORY_CARE_PROVIDER_SITE_OTHER): Payer: BC Managed Care – PPO | Admitting: Gynecology

## 2012-07-31 ENCOUNTER — Encounter: Payer: Self-pay | Admitting: Gynecology

## 2012-07-31 VITALS — BP 120/74 | Ht 63.0 in | Wt 126.0 lb

## 2012-07-31 DIAGNOSIS — Z01419 Encounter for gynecological examination (general) (routine) without abnormal findings: Secondary | ICD-10-CM

## 2012-07-31 NOTE — Patient Instructions (Addendum)
Call to Schedule your mammogram  Facilities in [place]: 1)  [facility], [address] Phone: [phone] 2)  [facility], [address] Phone: [phone] 3)  Dr. Bertrand at [facility]  [address] Phone: [phone]     Mammogram A mammogram is an X-ray test to find changes in a woman's breast. You should get a mammogram if:  You are 58 years of age or older  You have risk factors.   Your doctor recommends that you have one.  BEFORE THE TEST  Do not schedule the test the week before your period, especially if your breasts are sore during this time.  On the day of your mammogram:  Wash your breasts and armpits well. After washing, do not put on any deodorant or talcum powder on until after your test.   Eat and drink as you usually do.   Take your medicines as usual.   If you are diabetic and take insulin, make sure you:   Eat before coming for your test.   Take your insulin as usual.   If you cannot keep your appointment, call before the appointment to cancel. Schedule another appointment.  TEST  You will need to undress from the waist up. You will put on a hospital gown.   Your breast will be put on the mammogram machine, and it will press firmly on your breast with a piece of plastic called a compression paddle. This will make your breast flatter so that the machine can X-ray all parts of your breast.   Both breasts will be X-rayed. Each breast will be X-rayed from above and from the side. An X-ray might need to be taken again if the picture is not good enough.   The mammogram will last about 15 to 30 minutes.  AFTER THE TEST Finding out the results of your test Ask when your test results will be ready. Make sure you get your test results.  Document Released: 08/13/2008 Document Revised: 05/06/2011 Document Reviewed: 08/13/2008 [employer] Patient  Information 2012 [employer].   

## 2012-07-31 NOTE — Progress Notes (Signed)
VERNESHA TALBOT 1955/06/06 [id number]        57 y.o.  G0P0 for annual exam.  Doing well no complaints.  Past medical history,surgical history, medications, allergies, family history and social history were all reviewed and documented in the EPIC chart. ROS:  Was performed and pertinent positives and negatives are included in the history.  Exam: Kim assistant Filed Vitals:   07/31/12 0926  BP: 120/74  Height: 5\' 3"  (1.6 m)  Weight: 126 lb (57.153 kg)   General appearance  Normal Skin grossly normal Head/Neck normal with no cervical or supraclavicular adenopathy thyroid normal Lungs  clear Cardiac RR, without RMG Abdominal  soft, nontender, without masses, organomegaly or hernia Breasts  examined lying and sitting without masses, retractions, discharge or axillary adenopathy. Pelvic  Ext/BUS/vagina  normal with mild atrophic changes  Cervix  normal   Uterus  anteverted, normal size, shape and contour, midline and mobile nontender   Adnexa  Without masses or tenderness    Anus and perineum  normal   Rectovaginal  normal sphincter tone without palpated masses or tenderness.    Assessment/Plan:  57 y.o. G0P0 female for annual exam.   1. Postmenopausal doing well with no bleeding or significant symptoms. We'll continue to monitor. 2. Pap smear 2012. No Pap smear done today. No history of significant abnormalities. Plan repeat next year at 3 year interval. 3. Mammography 2012. Patient know she's overdue and agrees to schedule. SBE monthly reviewed. 4. DEXA 2010 normal. Plan repeat at 5 year interval. Increase calcium vitamin D reviewed. 5. Colonoscopy 2010. Followup with their recommended terrible. 6. Health maintenance. No blood work done as it is all done through her primary physician's office who she sees on a regular basis. Followup one year, sooner as needed.    Dara Lords MD, 10:52 AM 07/31/2012

## 2012-08-01 LAB — URINALYSIS W MICROSCOPIC + REFLEX CULTURE
Glucose, UA: NEGATIVE mg/dL
Leukocytes, UA: NEGATIVE
Protein, ur: NEGATIVE mg/dL
Urobilinogen, UA: 0.2 mg/dL (ref 0.0–1.0)

## 2012-08-02 LAB — URINE CULTURE
Colony Count: NO GROWTH
Organism ID, Bacteria: NO GROWTH

## 2012-08-16 ENCOUNTER — Encounter: Payer: Self-pay | Admitting: Gynecology

## 2012-08-28 ENCOUNTER — Other Ambulatory Visit: Payer: Self-pay

## 2012-08-28 DIAGNOSIS — Z1231 Encounter for screening mammogram for malignant neoplasm of breast: Secondary | ICD-10-CM

## 2012-09-03 ENCOUNTER — Other Ambulatory Visit: Payer: Self-pay | Admitting: Emergency Medicine

## 2012-09-06 ENCOUNTER — Telehealth: Payer: Self-pay

## 2012-09-06 MED ORDER — SERTRALINE HCL 50 MG PO TABS: ORAL_TABLET | ORAL | Status: DC

## 2012-09-06 NOTE — Telephone Encounter (Signed)
Was not denied. Was sent in, she states pharmacy advised was denied, resent for her.

## 2012-09-06 NOTE — Telephone Encounter (Addendum)
PT STATES SHE TAKES SERTRALINE AND IT WAS DENIED. WAS JUST HERE IN 2/14 AND DOESN'T [facility] PLEASE CALL [phone]

## 2012-09-07 ENCOUNTER — Telehealth: Payer: Self-pay

## 2012-09-07 NOTE — Telephone Encounter (Signed)
Pt called wanting to know status of prescription Zoloft. Pharmacy told pt Dr Cleta Alberts would not refill however Amy told pt it was okay. Please let pt know [phone]

## 2012-09-08 MED ORDER — SERTRALINE HCL 50 MG PO TABS: ORAL_TABLET | ORAL | Status: DC

## 2012-09-08 NOTE — Telephone Encounter (Signed)
Was sent in on 4/9/ 14, what is the problem? I called her advised it was sent in x2 asked her if she wants me to send to another pharmacy she advised me to send to [facility] [address], this is done.

## 2012-10-02 ENCOUNTER — Encounter: Payer: Self-pay | Admitting: Emergency Medicine

## 2012-10-02 ENCOUNTER — Telehealth: Payer: Self-pay

## 2012-10-02 ENCOUNTER — Ambulatory Visit (INDEPENDENT_AMBULATORY_CARE_PROVIDER_SITE_OTHER): Payer: BLUE CROSS/BLUE SHIELD | Admitting: Emergency Medicine

## 2012-10-02 VITALS — BP 118/74 | HR 102 | Temp 98.9°F | Resp 18 | Ht 64.5 in | Wt 124.0 lb

## 2012-10-02 DIAGNOSIS — Z0000 Encounter for general adult medical examination without abnormal findings: Secondary | ICD-10-CM

## 2012-10-02 DIAGNOSIS — F329 Major depressive disorder, single episode, unspecified: Secondary | ICD-10-CM

## 2012-10-02 DIAGNOSIS — G4700 Insomnia, unspecified: Secondary | ICD-10-CM

## 2012-10-02 DIAGNOSIS — E785 Hyperlipidemia, unspecified: Secondary | ICD-10-CM

## 2012-10-02 DIAGNOSIS — I10 Essential (primary) hypertension: Secondary | ICD-10-CM | POA: Insufficient documentation

## 2012-10-02 LAB — CBC
HCT: 39.7 % (ref 36.0–46.0)
MCH: 33.3 pg (ref 26.0–34.0)
MCV: 94.3 fL (ref 78.0–100.0)
Platelets: 296 10*3/uL (ref 150–400)
RBC: 4.21 MIL/uL (ref 3.87–5.11)

## 2012-10-02 LAB — LIPID PANEL
Cholesterol: 205 mg/dL — ABNORMAL HIGH (ref 0–200)
HDL: 49 mg/dL (ref >39)
LDL Cholesterol: 108 mg/dL — ABNORMAL HIGH (ref 0–99)
Triglycerides: 241 mg/dL — ABNORMAL HIGH (ref <150)

## 2012-10-02 LAB — COMPREHENSIVE METABOLIC PANEL
ALT: 16 U/L (ref 0–35)
BUN: 8 mg/dL (ref 6–23)
CO2: 24 mEq/L (ref 19–32)
Calcium: 9.3 mg/dL (ref 8.4–10.5)
Creat: 0.77 mg/dL (ref 0.50–1.10)
Total Bilirubin: 0.5 mg/dL (ref 0.3–1.2)

## 2012-10-02 MED ORDER — CLONAZEPAM 1 MG PO TABS: 1.0000 mg | ORAL_TABLET | Freq: Every evening | ORAL | Status: DC | PRN

## 2012-10-02 MED ORDER — SERTRALINE HCL 50 MG PO TABS: ORAL_TABLET | ORAL | Status: DC

## 2012-10-02 MED ORDER — FLUTICASONE PROPIONATE 50 MCG/ACT NA SUSP: NASAL | Status: DC

## 2012-10-02 MED ORDER — ROSUVASTATIN CALCIUM 5 MG PO TABS: 5.0000 mg | ORAL_TABLET | Freq: Every day | ORAL | Status: DC

## 2012-10-02 NOTE — Progress Notes (Signed)
@[email or web address] @  Patient ID: Beth Rice MRN: [id number], DOB: [birth year], 57 y.o. Date of Encounter: 10/02/2012, 1:22 PM  Primary Physician: Lucilla Edin, MD  Chief Complaint: Physical (CPE)  HPI: 57 y.o. y/o female with history of noted below here for CPE.  Doing well. No issues/complaints.  LMP:  Pap: MMG: Review of Systems:  Consitutional: No fever, chills, fatigue, night sweats, lymphadenopathy, or weight changes. Eyes: No visual changes, eye redness, or discharge. ENT/Mouth: Ears: No otalgia, tinnitus, hearing loss, discharge. Nose: No congestion, rhinorrhea, sinus pain, or epistaxis. Throat: No sore throat, post nasal drip, or teeth pain. Cardiovascular: No CP, palpitations, diaphoresis, DOE, edema, orthopnea, PND. Respiratory:    Gastrointestinal: No anorexia, dysphagia, reflux, pain, nausea, vomiting, hematemesis, diarrhea, constipation, BRBPR, or melena. Breast: No discharge, pain, swelling, or mass. Genitourinary: No dysuria, frequency, urgency, hematuria, incontinence, nocturia, amenorrhea, vaginal discharge, pruritis, burning, abnormal bleeding, or pain. Musculoskeletal: No decreased ROM, myalgias, stiffness, joint swelling, or weakness. Skin: No rash, erythema, lesion changes, pain, warmth, jaundice, or pruritis. Neurological: No headache, dizziness, syncope, seizures, tremors, memory loss, coordination problems, or paresthesias. Psychological: No anxiety, depression, hallucinations, SI/HI. Endocrine: No fatigue, polydipsia, polyphagia, polyuria, or known diabetes. All other systems were reviewed and are otherwise negative.  Past Medical History  Diagnosis Date  . Allergy   . Anxiety   . Elevated cholesterol      Past Surgical History  Procedure Laterality Date  . Ovarian cyst removal    . Tonsillectomy    . Cardiac catheterization      Home Meds:  Prior to Admission medications   Medication Sig Start Date End Date Taking? Authorizing Provider  aspirin  81 MG tablet Take 81 mg by mouth daily.   Yes Historical Provider, MD  b complex vitamins capsule Take 1 capsule by mouth daily.   Yes Historical Provider, MD  Calcium Carbonate-Vitamin D (CALCIUM + D PO) Take by mouth.   Yes Historical Provider, MD  clonazePAM (KLONOPIN) 1 MG tablet Take 1 tablet (1 mg total) by mouth at bedtime as needed for anxiety. 10/02/12  Yes Collene Gobble, MD  fish oil-omega-3 fatty acids 1000 MG capsule Take 2 g by mouth daily.   Yes Historical Provider, MD  fluticasone (FLONASE) 50 MCG/ACT nasal spray USE TWO SPRAYS IN EACH NOSTRIL EVERY DAY 09/03/12  Yes Marzella Schlein McClung, PA-C  Lysine 500 MG CAPS Take by mouth.   Yes Historical Provider, MD  Multiple Vitamins-Minerals (MULTIVITAMIN WITH MINERALS) tablet Take 1 tablet by mouth daily.   Yes Historical Provider, MD  rosuvastatin (CRESTOR) 5 MG tablet Take 1 tablet (5 mg total) by mouth daily. 09/01/11  Yes Collene Gobble, MD  sertraline (ZOLOFT) 50 MG tablet TAKE ONE TABLET BY MOUTH DAILY 09/08/12  Yes Marzella Schlein McClung, PA-C  loratadine (CLARITIN) 10 MG tablet Take 10 mg by mouth daily.    Historical Provider, MD    Allergies:  Allergies  Allergen Reactions  . Penicillins Hives  . Sulfa Antibiotics Hives    History   Social History  . Marital Status: Single    Spouse Name: N/A    Number of Children: N/A  . Years of Education: N/A   Occupational History  . Not on file.   Social History Main Topics  . Smoking status: Former Smoker -- 1.00 packs/day    Types: Cigarettes  . Smokeless tobacco: Not on file  . Alcohol Use: Yes     Comment: social  . Drug Use: No  .  Sexually Active: No   Other Topics Concern  . Not on file   Social History Narrative  . No narrative on file    Family History  Problem Relation Age of Onset  . Hypertension Sister   . Heart disease Mother   . Alzheimer's disease Mother   . Cerebral aneurysm Father     Physical Exam  Blood pressure 118/74, pulse 102, temperature 98.9 F  (37.2 C), resp. rate 18, height 5' 4.5" (1.638 m), weight 124 lb (56.246 kg)., Body mass index is 20.96 kg/(m^2). General: Well developed, well nourished, in no acute distress. HEENT: Normocephalic, atraumatic. Conjunctiva pink, sclera non-icteric. Pupils 2 mm constricting to 1 mm, round, regular, and equally reactive to light and accomodation. EOMI. Internal auditory canal clear. TMs with good cone of light and without pathology. Nasal mucosa pink. Nares are without discharge. No sinus tenderness. Oral mucosa pink. Dentition . Pharynx without exudate.   Neck: Supple. Trachea midline. No thyromegaly. Full ROM. No lymphadenopathy. Lungs: Clear to auscultation bilaterally without wheezes, rales, or rhonchi. Breathing is of normal effort and unlabored. Cardiovascular: RRR with S1 S2. No murmurs, rubs, or gallops appreciated. Distal pulses 2+ symmetrically. No carotid or abdominal bruits.  Breast: No masses felt breast exam is normal  Abdomen: Soft, non-tender, non-distended with normoactive bowel sounds. No hepatosplenomegaly or masses. No rebound/guarding. No CVA tenderness. Without hernias.  Genitourinary: Not performed    Musculoskeletal: Full range of motion and 5/5 strength throughout. Without swelling, atrophy, tenderness, crepitus, or warmth. Extremities without clubbing, cyanosis, or edema. Calves supple. Skin: Warm and moist without erythema, ecchymosis, wounds, or rash. Neuro: A+Ox3. CN II-XII grossly intact. Moves all extremities spontaneously. Full sensation throughout. Normal gait. DTR 2+ throughout upper and lower extremities. Finger to nose intact. Psych:  Responds to questions appropriately with a normal affect.        Assessment/Plan:  57 y.o. y/o female here for CPE. Meds are refilled. Prescription given for shingles vaccine.  -  Signed, Earl Lites, MD 10/02/2012 1:22 PM

## 2012-10-02 NOTE — Telephone Encounter (Signed)
Telephone call to patient for pharmacy choice.  Crestor 5mg  [facility] request.

## 2012-10-03 ENCOUNTER — Ambulatory Visit
Admission: RE | Admit: 2012-10-03 | Discharge: 2012-10-03 | Disposition: A | Discharge status: Home / Self Care | Payer: BC Managed Care – PPO | Source: Ambulatory Visit

## 2012-10-03 DIAGNOSIS — Z1231 Encounter for screening mammogram for malignant neoplasm of breast: Secondary | ICD-10-CM

## 2012-12-15 ENCOUNTER — Telehealth: Payer: Self-pay | Admitting: Radiology

## 2012-12-15 DIAGNOSIS — G4700 Insomnia, unspecified: Secondary | ICD-10-CM

## 2012-12-15 MED ORDER — CLONAZEPAM 1 MG PO TABS: 1.0000 mg | ORAL_TABLET | Freq: Every evening | ORAL | Status: DC | PRN

## 2012-12-15 NOTE — Telephone Encounter (Signed)
[facility]. It was not filled there on 10/02/12 but patient indicates she brought in the Rx and wants the renewal/ I wanted to see if she has gotten this elsewhere before I authorize the refill that is associated with the Rx, can you check database and let me know if okay to send in the refill.

## 2012-12-15 NOTE — Telephone Encounter (Signed)
error 

## 2012-12-15 NOTE — Telephone Encounter (Signed)
Database report does not indicate that the medication was filled on 10/02/12

## 2012-12-15 NOTE — Telephone Encounter (Signed)
Please ask the physician assistant to pull a patient in the data base to be sure that patient is not receiving prescriptions from other pharmacies. If she is clear on the database okay to refill the medication.

## 2012-12-15 NOTE — Telephone Encounter (Signed)
Printed

## 2013-01-17 ENCOUNTER — Other Ambulatory Visit: Payer: Self-pay | Admitting: Emergency Medicine

## 2013-01-18 NOTE — Telephone Encounter (Signed)
Dr. Merla Riches,  This is one of Dr. Ellis Parents patient's and she gets a 90-day supply of this.  Please review and authorize if you are in agreement.  Thanks, Viva Gallaher

## 2013-05-02 ENCOUNTER — Ambulatory Visit: Payer: BLUE CROSS/BLUE SHIELD

## 2013-05-02 ENCOUNTER — Ambulatory Visit (INDEPENDENT_AMBULATORY_CARE_PROVIDER_SITE_OTHER): Payer: BLUE CROSS/BLUE SHIELD | Admitting: Emergency Medicine

## 2013-05-02 VITALS — BP 110/76 | HR 81 | Temp 98.6°F | Resp 18 | Wt 126.0 lb

## 2013-05-02 DIAGNOSIS — M549 Dorsalgia, unspecified: Secondary | ICD-10-CM

## 2013-05-02 MED ORDER — PREDNISONE 10 MG PO TABS: ORAL_TABLET | ORAL | Status: DC

## 2013-05-02 MED ORDER — HYDROCODONE-ACETAMINOPHEN 5-325 MG PO TABS: ORAL_TABLET | ORAL | Status: DC

## 2013-05-02 NOTE — Patient Instructions (Signed)
Sciatica °Sciatica is pain, weakness, numbness, or tingling along the path of the sciatic nerve. The nerve starts in the lower back and runs down the back of each leg. The nerve controls the muscles in the lower leg and in the back of the knee, while also providing sensation to the back of the thigh, lower leg, and the sole of your foot. Sciatica is a symptom of another medical condition. For instance, nerve damage or certain conditions, such as a herniated disk or bone spur on the spine, pinch or put pressure on the sciatic nerve. This causes the pain, weakness, or other sensations normally associated with sciatica. Generally, sciatica only affects one side of the body. °CAUSES  °· Herniated or slipped disc. °· Degenerative disk disease. °· A pain disorder involving the narrow muscle in the buttocks (piriformis syndrome). °· Pelvic injury or fracture. °· Pregnancy. °· Tumor (rare). °SYMPTOMS  °Symptoms can vary from mild to very severe. The symptoms usually travel from the low back to the buttocks and down the back of the leg. Symptoms can include: °· Mild tingling or dull aches in the lower back, leg, or hip. °· Numbness in the back of the calf or sole of the foot. °· Burning sensations in the lower back, leg, or hip. °· Sharp pains in the lower back, leg, or hip. °· Leg weakness. °· Severe back pain inhibiting movement. °These symptoms may get worse with coughing, sneezing, laughing, or prolonged sitting or standing. Also, being overweight may worsen symptoms. °DIAGNOSIS  °Your caregiver will perform a physical exam to look for common symptoms of sciatica. He or she may ask you to do certain movements or activities that would trigger sciatic nerve pain. Other tests may be performed to find the cause of the sciatica. These may include: °· Blood tests. °· X-rays. °· Imaging tests, such as an MRI or CT scan. °TREATMENT  °Treatment is directed at the cause of the sciatic pain. Sometimes, treatment is not necessary  and the pain and discomfort goes away on its own. If treatment is needed, your caregiver may suggest: °· Over-the-counter medicines to relieve pain. °· Prescription medicines, such as anti-inflammatory medicine, muscle relaxants, or narcotics. °· Applying heat or ice to the painful area. °· Steroid injections to lessen pain, irritation, and inflammation around the nerve. °· Reducing activity during periods of pain. °· Exercising and stretching to strengthen your abdomen and improve flexibility of your spine. Your caregiver may suggest losing weight if the extra weight makes the back pain worse. °· Physical therapy. °· Surgery to eliminate what is pressing or pinching the nerve, such as a bone spur or part of a herniated disk. °HOME CARE INSTRUCTIONS  °· Only take over-the-counter or prescription medicines for pain or discomfort as directed by your caregiver. °· Apply ice to the affected area for 20 minutes, 3 4 times a day for the first 48 72 hours. Then try heat in the same way. °· Exercise, stretch, or perform your usual activities if these do not aggravate your pain. °· Attend physical therapy sessions as directed by your caregiver. °· Keep all follow-up appointments as directed by your caregiver. °· Do not wear high heels or shoes that do not provide proper support. °· Check your mattress to see if it is too soft. A firm mattress may lessen your pain and discomfort. °SEEK IMMEDIATE MEDICAL CARE IF:  °· You lose control of your bowel or bladder (incontinence). °· You have increasing weakness in the lower back,   pelvis, buttocks, or legs. °· You have redness or swelling of your back. °· You have a burning sensation when you urinate. °· You have pain that gets worse when you lie down or awakens you at night. °· Your pain is worse than you have experienced in the past. °· Your pain is lasting longer than 4 weeks. °· You are suddenly losing weight without reason. °MAKE SURE YOU: °· Understand these  instructions. °· Will watch your condition. °· Will get help right away if you are not doing well or get worse. °Document Released: 05/11/2001 Document Revised: 11/16/2011 Document Reviewed: 09/26/2011 °[employer]® Patient Information ©2014 [employer]. ° °

## 2013-05-02 NOTE — Progress Notes (Signed)
Subjective:    Patient ID: Beth Rice, female    DOB: 06-11-1955, 57 y.o.   MRN: [id number] This chart was scribed for Collene Gobble, MD by Valera Castle, ED Scribe. This patient was seen in room 12 and the patient's care was started at 9:16 AM.  Chief Complaint  Patient presents with   Back Pain    ? sciatica    HPI Beth Rice is a 57 y.o. female who presents to the [facility] complaining of sudden, moderate, constant back pain, with associated spasms, onset 3 days ago when she bent down and put on her socks. She states that both her buttocks started having spasms 2 days ago, and that yesterday the spasms starting radiating down both her legs. She reports that today her back pain spasm is radiating down her right leg, but no longer in her left. She reports that when she felt the pain radiate down her legs she knew it was due to her sciatic nerve. She reports having a hard time ambulating. She reports taking Ibuprofen, with no relief.She denies a h/o similar symptoms. She denies h/o back injuries and back surgeries. She denies any urinary symptoms, and any other associated symptoms. She reports that at work she will occassionally have to lift a few boxes.  PCP - Lucilla Edin, MD  Patient Active Problem List   Diagnosis Date Noted   Unspecified essential hypertension 10/02/2012   Other and unspecified hyperlipidemia 10/02/2012   Depression 10/02/2012   Past Medical History  Diagnosis Date   Allergy    Anxiety    Elevated cholesterol    Past Surgical History  Procedure Laterality Date   Ovarian cyst removal     Tonsillectomy     Cardiac catheterization     Allergies  Allergen Reactions   Penicillins Hives   Sulfa Antibiotics Hives   Prior to Admission medications   Medication Sig Start Date End Date Taking? Authorizing Provider  aspirin 81 MG tablet Take 81 mg by mouth daily.   Yes Historical Provider, MD  b complex vitamins capsule Take 1 capsule by mouth  daily.   Yes Historical Provider, MD  Calcium Carbonate-Vitamin D (CALCIUM + D PO) Take by mouth.   Yes Historical Provider, MD  clonazePAM (KLONOPIN) 1 MG tablet TAKE 1 TABLET BY MOUTH AT BEDTIME AS NEEDED FOR ANXIETY 01/17/13  Yes Tonye Pearson, MD  fish oil-omega-3 fatty acids 1000 MG capsule Take 2 g by mouth daily.   Yes Historical Provider, MD  fluticasone (FLONASE) 50 MCG/ACT nasal spray USE TWO SPRAYS IN EACH NOSTRIL EVERY DAY 10/02/12  Yes Collene Gobble, MD  loratadine (CLARITIN) 10 MG tablet Take 10 mg by mouth daily.   Yes Historical Provider, MD  Lysine 500 MG CAPS Take by mouth.   Yes Historical Provider, MD  Multiple Vitamins-Minerals (MULTIVITAMIN WITH MINERALS) tablet Take 1 tablet by mouth daily.   Yes Historical Provider, MD  rosuvastatin (CRESTOR) 5 MG tablet Take 1 tablet (5 mg total) by mouth daily. 10/02/12  Yes Collene Gobble, MD  sertraline (ZOLOFT) 50 MG tablet TAKE ONE TABLET BY MOUTH DAILY 10/02/12  Yes Collene Gobble, MD    Review of Systems  Constitutional: Negative for fever.  Musculoskeletal: Positive for back pain (sciatic nerve) and gait problem. Negative for joint swelling, neck pain and neck stiffness.  Psychiatric/Behavioral: Negative for dysphoric mood.    Triage Vitals: BP 110/76   Pulse 81   Temp(Src) 98.6 F (  37 C) (Oral)   Resp 18   Wt 126 lb (57.153 kg)   SpO2 99%     Objective:   Physical Exam Nursing note and vitals reviewed. Constitutional: Pt is oriented to person, place, and time. Pt appears well-developed and well-nourished. No distress.  HENT:  Head: Normocephalic and atraumatic.  Eyes: EOM are normal. Pupils are equal, round, and reactive to light.  Neck: Neck supple. No tracheal deviation present.  Cardiovascular: Normal rate, regular rhythm and normal heart sounds.  Exam reveals no gallop and no friction rub. No murmur heard. Pulmonary/Chest: Effort normal and breath sounds normal. No respiratory distress. Pt has no wheezes. Pt has no  rales.  Abdominal: Soft. Bowel sounds are normal. There is no tenderness. There is no rebound and no guarding.  Musculoskeletal: Normal range of motion.  Neurological: Pt is alert and oriented to person, place, and time.  Skin: Skin is warm and dry.  Psychiatric: Pt has a normal mood and affect. Pt's behavior is normal.  [facility] reading (PRIMARY) by  Dr. Cleta Alberts there is degenerative disc disease with spurring at L2 and L3       Assessment & Plan:   We'll give hydrocodone for pain at night. She will be on a tapered dose of prednisone. Recheck Sunday if continued problem    I personally performed the services described in this documentation, which was scribed in my presence. The recorded information has been reviewed and is accurate.

## 2013-05-05 ENCOUNTER — Telehealth: Payer: Self-pay

## 2013-05-05 NOTE — Telephone Encounter (Signed)
Patient calling to see if Dr. Cleta Alberts could possibly give her muscle relaxer instead of the pain medication hydrocodone (she says she did not fill or pick up that medication)   [facility] [address]   [phone]

## 2013-05-06 NOTE — Telephone Encounter (Signed)
Okay to call in Robaxin 750 to take every 6-8 hours as a muscle relaxant. She can have #40 capsules. She can have one refill.

## 2013-05-07 MED ORDER — METHOCARBAMOL 750 MG PO TABS: 750.0000 mg | ORAL_TABLET | Freq: Four times a day (QID) | ORAL | Status: DC

## 2013-05-30 ENCOUNTER — Other Ambulatory Visit: Payer: Self-pay | Admitting: Internal Medicine

## 2013-06-22 ENCOUNTER — Ambulatory Visit (INDEPENDENT_AMBULATORY_CARE_PROVIDER_SITE_OTHER): Payer: BLUE CROSS/BLUE SHIELD | Admitting: Emergency Medicine

## 2013-06-22 ENCOUNTER — Telehealth: Payer: Self-pay

## 2013-06-22 ENCOUNTER — Ambulatory Visit
Admission: RE | Admit: 2013-06-22 | Discharge: 2013-06-22 | Disposition: A | Discharge status: Home / Self Care | Payer: BC Managed Care – PPO | Source: Ambulatory Visit | Attending: Emergency Medicine | Admitting: Emergency Medicine

## 2013-06-22 VITALS — BP 116/66 | HR 87 | Temp 99.6°F | Resp 18 | Ht 64.5 in | Wt 127.0 lb

## 2013-06-22 DIAGNOSIS — M549 Dorsalgia, unspecified: Secondary | ICD-10-CM

## 2013-06-22 DIAGNOSIS — IMO0002 Reserved for concepts with insufficient information to code with codable children: Secondary | ICD-10-CM

## 2013-06-22 DIAGNOSIS — M545 Low back pain, unspecified: Secondary | ICD-10-CM

## 2013-06-22 DIAGNOSIS — M5417 Radiculopathy, lumbosacral region: Secondary | ICD-10-CM

## 2013-06-22 MED ORDER — PREDNISONE 10 MG PO TABS: ORAL_TABLET | ORAL | Status: DC

## 2013-06-22 MED ORDER — HYDROCODONE-ACETAMINOPHEN 5-325 MG PO TABS: ORAL_TABLET | ORAL | Status: DC

## 2013-06-22 MED ORDER — ALPRAZOLAM 0.5 MG PO TABS: ORAL_TABLET | ORAL | Status: DC

## 2013-06-22 NOTE — Telephone Encounter (Signed)
Called pt pharmacy- they have stock in xanax. Advised pt she needs to bring that prescription to the pharmacy to have it filled since she is now in possession of it.

## 2013-06-22 NOTE — Telephone Encounter (Signed)
Patient states that she was seen today and prescribed Xanax however she states that every pharmacy seems to be out of it. Can we [facility] to [facility] [address]?  [phone]

## 2013-06-22 NOTE — Progress Notes (Addendum)
  Subjective:   Patient ID: Beth Rice, female    DOB: 1956/03/02, 59 y.o.   MRN: [id number]  This chart was scribed for Nena Jordan, MD by Elby Beck, Scribe. This patient was seen in room 3 and the patient's care was started at 8:33 AM.  Chief Complaint  Patient presents with  . Back Pain    10 plus pain ongoing issue     HPI  HPI Comments: Beth Rice is a 58 y.o. Female, who works as a Patent attorney, who presents to [facility] complaining of "10/10" back pain that began last Monday, 11 days ago, which has progressively worsened since onset. She describes her pain as sciatic nerve pain that originates in her lower back and radiates to her bilateral legs. She states that the radiation of pain is more severe in her right leg. She reports associated paresthesias in her back and bilateral legs. She reports that she can't sleep well at night due this pain. She denies any history of back injury. She states that to her knowledge she has not recently overexerted herself or slept wrong to have onset this back pain. She states that she has been seen here one previous time for similar pain. She had an X-ray showing mild degenerative disc disease at L2-L3. She was given Prednisone which she reports offered some relief of her back pain for about 2-3 weeks. She denies any bowel or bladder incontinence. She states that she has been given Robaxin, which made her pass blood in stool, and Hydrocodone which she states "made her crazy in the head" and gave her a burning sensation in her legs. She reports that she has not had a MRI of her back.   Review of Systems  Gastrointestinal:       Denies bowel incontinence.  Genitourinary:       Denies bladder incontinence.  Musculoskeletal: Positive for back pain.      BP 116/66  Pulse 87  Temp(Src) 99.6 F (37.6 C) (Oral)  Resp 18  Ht 5' 4.5" (1.638 m)  Wt 127 lb (57.607 kg)  BMI 21.47 kg/m2  SpO2  97% Objective:   Physical Exam CONSTITUTIONAL: Well developed/well nourished HEAD: Normocephalic/atraumatic EYES: EOMI/PERRL ENMT: Mucous membranes moist NECK: supple no meningeal signs SPINE:entire spine nontender CV: S1/S2 noted, no murmurs/rubs/gallops noted LUNGS: Lungs are clear to auscultation bilaterally, no apparent distress ABDOMEN: soft, nontender, no rebound or guarding GU:no cva tenderness NEURO: Pt is awake/alert, moves all extremitiesx4 EXTREMITIES: pulses normal, full ROM SKIN: warm, color normal PSYCH: no abnormalities of mood noted BACK there is tenderness over the right SI joint. Straight leg raising on the right is positive at 75. Her reflexes are 2+ and symmetrical ankles and knees. Sensation on the lateral proximal left calf     Assessment & Plan:  We'll treat with prednisone  andXanax. There is a questionable allergic type symptoms to hydrocodone however she recently took a friend's Vicodin with good results . MRI ordered   I personally performed the services described in this documentation, which was scribed in my presence. The recorded information has been reviewed and is accurate.

## 2013-06-22 NOTE — Patient Instructions (Addendum)
Go to [facility] today at 1145 am [address] [phone].

## 2013-06-25 ENCOUNTER — Ambulatory Visit (INDEPENDENT_AMBULATORY_CARE_PROVIDER_SITE_OTHER): Payer: BLUE CROSS/BLUE SHIELD | Admitting: Emergency Medicine

## 2013-06-25 VITALS — BP 128/78 | HR 100 | Temp 98.0°F | Resp 16 | Ht 64.5 in | Wt 127.0 lb

## 2013-06-25 DIAGNOSIS — M549 Dorsalgia, unspecified: Secondary | ICD-10-CM

## 2013-06-25 NOTE — Progress Notes (Signed)
   Subjective:    Patient ID: Beth Rice, female    DOB: November 27, 1955, 58 y.o.   MRN: [id number]  HPI here to follow up severe low back pain. Overall she is much improved . She has an 8/10 level of pain if she does not take her medication but with medication she is a 4/10. She is able to ambulate without difficulty. The paresthesias in her lower legs are improved . MRI demonstrated degenerative disc disease at L2-L3 with arthritic change at L4-L5 but no levels showed nerve impingement    Review of Systems     Objective:   Physical Exam patient looks significantly better. Her range of motion is improved. Straight leg raising is negative to 90 in both legs deep tendon reflexes in the knees and ankles are 2+        Assessment & Plan:  Significantly better today. She was given a back manual today and referral to be seen at [place] by Dr. Nelva Bush.

## 2013-06-25 NOTE — Patient Instructions (Signed)
Please stay out of work this week. Please do the stretches we  suggested. An appointment is being made for you to followup with Dr. Nelva Bush

## 2013-07-13 ENCOUNTER — Other Ambulatory Visit: Payer: Self-pay | Admitting: Emergency Medicine

## 2013-07-13 DIAGNOSIS — M549 Dorsalgia, unspecified: Secondary | ICD-10-CM

## 2013-07-13 NOTE — Telephone Encounter (Signed)
Patient called stating she needs a refill on Vicodin

## 2013-07-16 MED ORDER — HYDROCODONE-ACETAMINOPHEN 5-325 MG PO TABS: ORAL_TABLET | ORAL | Status: DC

## 2013-07-16 NOTE — Addendum Note (Signed)
Addended by: Elwyn Reach A on: 07/16/2013 09:05 AM   Modules accepted: Orders

## 2013-07-16 NOTE — Telephone Encounter (Signed)
Called pt to report alprazolam has been sent in. Pt also requests RF of vicodan. I have pended it also for review.

## 2013-07-17 NOTE — Telephone Encounter (Signed)
Notified pt hydrocodone Rx ready.

## 2013-08-02 ENCOUNTER — Encounter: Payer: BLUE CROSS/BLUE SHIELD | Admitting: Gynecology

## 2013-08-30 ENCOUNTER — Encounter: Payer: BLUE CROSS/BLUE SHIELD | Admitting: Gynecology

## 2013-09-17 ENCOUNTER — Other Ambulatory Visit: Payer: Self-pay

## 2013-09-17 DIAGNOSIS — Z1231 Encounter for screening mammogram for malignant neoplasm of breast: Secondary | ICD-10-CM

## 2013-09-19 ENCOUNTER — Other Ambulatory Visit: Payer: Self-pay | Admitting: Emergency Medicine

## 2013-09-19 NOTE — Telephone Encounter (Signed)
Pt has an appt with Dr. Everlene Farrier 10-23-13

## 2013-10-04 ENCOUNTER — Ambulatory Visit
Admission: RE | Admit: 2013-10-04 | Discharge: 2013-10-04 | Disposition: A | Discharge status: Home / Self Care | Payer: BC Managed Care – PPO | Source: Ambulatory Visit

## 2013-10-04 DIAGNOSIS — Z1231 Encounter for screening mammogram for malignant neoplasm of breast: Secondary | ICD-10-CM

## 2013-10-09 ENCOUNTER — Encounter: Payer: BLUE CROSS/BLUE SHIELD | Admitting: Emergency Medicine

## 2013-10-10 ENCOUNTER — Other Ambulatory Visit: Payer: Self-pay | Admitting: Emergency Medicine

## 2013-10-23 ENCOUNTER — Other Ambulatory Visit: Payer: Self-pay | Admitting: Emergency Medicine

## 2013-10-23 ENCOUNTER — Ambulatory Visit (INDEPENDENT_AMBULATORY_CARE_PROVIDER_SITE_OTHER): Payer: BLUE CROSS/BLUE SHIELD | Admitting: Emergency Medicine

## 2013-10-23 ENCOUNTER — Encounter: Payer: Self-pay | Admitting: Emergency Medicine

## 2013-10-23 VITALS — BP 124/80 | HR 100 | Temp 99.1°F | Resp 16 | Ht 62.5 in | Wt 127.0 lb

## 2013-10-23 DIAGNOSIS — Z0000 Encounter for general adult medical examination without abnormal findings: Secondary | ICD-10-CM

## 2013-10-23 DIAGNOSIS — E785 Hyperlipidemia, unspecified: Secondary | ICD-10-CM

## 2013-10-23 DIAGNOSIS — I10 Essential (primary) hypertension: Secondary | ICD-10-CM

## 2013-10-23 LAB — CBC WITH DIFFERENTIAL/PLATELET
BASOS PCT: 1 % (ref 0–1)
Basophils Absolute: 0.1 10*3/uL (ref 0.0–0.1)
EOS ABS: 0 10*3/uL (ref 0.0–0.7)
EOS PCT: 0 % (ref 0–5)
HEMATOCRIT: 37.3 % (ref 36.0–46.0)
HEMOGLOBIN: 13.1 g/dL (ref 12.0–15.0)
LYMPHS ABS: 1.8 10*3/uL (ref 0.7–4.0)
Lymphocytes Relative: 25 % (ref 12–46)
MCH: 33.2 pg (ref 26.0–34.0)
MCHC: 35.1 g/dL (ref 30.0–36.0)
MCV: 94.4 fL (ref 78.0–100.0)
MONO ABS: 0.6 10*3/uL (ref 0.1–1.0)
MONOS PCT: 8 % (ref 3–12)
Neutro Abs: 4.6 10*3/uL (ref 1.7–7.7)
Neutrophils Relative %: 66 % (ref 43–77)
Platelets: 240 10*3/uL (ref 150–400)
RBC: 3.95 MIL/uL (ref 3.87–5.11)
RDW: 12.3 % (ref 11.5–15.5)
WBC: 7 10*3/uL (ref 4.0–10.5)

## 2013-10-23 LAB — LIPID PANEL
CHOL/HDL RATIO: 4.2 ratio
CHOLESTEROL: 229 mg/dL — AB (ref 0–200)
HDL: 55 mg/dL (ref >39)
LDL Cholesterol: 147 mg/dL — ABNORMAL HIGH (ref 0–99)
Triglycerides: 133 mg/dL (ref <150)
VLDL: 27 mg/dL (ref 0–40)

## 2013-10-23 LAB — COMPLETE METABOLIC PANEL WITH GFR
ALT: 16 U/L (ref 0–35)
AST: 25 U/L (ref 0–37)
Albumin: 4.5 g/dL (ref 3.5–5.2)
Alkaline Phosphatase: 68 U/L (ref 39–117)
BILIRUBIN TOTAL: 0.7 mg/dL (ref 0.2–1.2)
BUN: 8 mg/dL (ref 6–23)
CO2: 27 meq/L (ref 19–32)
Calcium: 9.3 mg/dL (ref 8.4–10.5)
Chloride: 102 mEq/L (ref 96–112)
Creat: 0.73 mg/dL (ref 0.50–1.10)
GLUCOSE: 108 mg/dL — AB (ref 70–99)
Potassium: 3.8 mEq/L (ref 3.5–5.3)
Sodium: 138 mEq/L (ref 135–145)
Total Protein: 6.8 g/dL (ref 6.0–8.3)

## 2013-10-23 LAB — POCT URINALYSIS DIPSTICK
BILIRUBIN UA: NEGATIVE
GLUCOSE UA: NEGATIVE
KETONES UA: NEGATIVE
Leukocytes, UA: NEGATIVE
NITRITE UA: NEGATIVE
PH UA: 6
Protein, UA: NEGATIVE
Spec Grav, UA: <=1.005
Urobilinogen, UA: 0.2

## 2013-10-23 LAB — TSH: TSH: 1.616 u[IU]/mL (ref 0.350–4.500)

## 2013-10-23 MED ORDER — CLINDAMYCIN HCL 150 MG PO CAPS: 150.0000 mg | ORAL_CAPSULE | Freq: Four times a day (QID) | ORAL | Status: DC

## 2013-10-23 MED ORDER — EZETIMIBE 10 MG PO TABS: 10.0000 mg | ORAL_TABLET | Freq: Every day | ORAL | Status: DC

## 2013-10-23 NOTE — Patient Instructions (Signed)

## 2013-10-23 NOTE — Progress Notes (Signed)
This chart was scribed for Darlyne Russian, MD by Eston Mould, ED Scribe. This patient was seen in room Room/bed 21 and the patient's care was started at 10:05 AM. Subjective:    Patient ID: Beth Rice, female    DOB: [birth year], 58 y.o.   MRN: [id number] Chief Complaint  Patient presents with  . Annual Exam   HPI Beth Rice is a 58 y.o. female who presents to the [facility] for annual exam. Pt states she had 2 shots to her back given by Dr. Nelva Bush and states the pain is tolerable; rates her pain 5/10. States her R leg while driving brings pain. Is scheduled for 3rd shot September 2015. Pt states she is not taking Crestor. States she has not been able to tolerate statins since her back pain began. Pt is taking 50-ml of Zoloft and takes Xanax PRN. States her anxiety has improved and reports going out and staying busy; has been involved in the neighborhood, went to the beach over the weekend and has spent time with friends lately. States she has added Clindamycin 150-mg x 4/day for a possible dental infection to L lower jaw; states she had a dental procedure and was not pleased with the dentist whom preformed the service. Pt states she has not been able to eat as her normal. Denies any new sx.  Colonoscopy:due 2016 by Dr. Collene Mares.. Tdap: last given in 2013 .   Eye exam:  Has received new glasses, progressive lenses. Hx of surgeries: ovarian cyst removed years ago; OB/GYN:Dr. Fontain. Paps are done every 3 years.  Breast exam:  Was seen at breast center; results will be sent to Dr. Everlene Farrier.  Patient Active Problem List   Diagnosis Date Noted  . Unspecified essential hypertension 10/02/2012  . Other and unspecified hyperlipidemia 10/02/2012  . Depression 10/02/2012   Current outpatient prescriptions:ALPRAZolam (XANAX) 0.5 MG tablet, TAKE 1 TABLET EVERY 6 HOURS AS NEEDED FOR MUSCLE SPASMS, Disp: 30 tablet, Rfl: 0;  aspirin 81 MG tablet, Take 81 mg by mouth daily., Disp: , Rfl: ;  b  complex vitamins capsule, Take 1 capsule by mouth daily., Disp: , Rfl: ;  Calcium Carbonate-Vitamin D (CALCIUM + D PO), Take by mouth., Disp: , Rfl:  clonazePAM (KLONOPIN) 1 MG tablet, TAKE 1 TABLET BY MOUTH AT BEDTIME AS NEEDED FOR ANXIETY, Disp: 90 tablet, Rfl: 0;  fish oil-omega-3 fatty acids 1000 MG capsule, Take 2 g by mouth daily., Disp: , Rfl: ;  fluticasone (FLONASE) 50 MCG/ACT nasal spray, Place 2 sprays into both nostrils daily. PATIENT NEEDS OFFICE VISIT FOR ADDITIONAL REFILLS, Disp: 16 g, Rfl: 0;  loratadine (CLARITIN) 10 MG tablet, Take 10 mg by mouth daily., Disp: , Rfl:  Lysine 500 MG CAPS, Take by mouth., Disp: , Rfl: ;  methocarbamol (ROBAXIN-750) 750 MG tablet, Take 1 tablet (750 mg total) by mouth 4 (four) times daily., Disp: 40 tablet, Rfl: 1;  Multiple Vitamins-Minerals (MULTIVITAMIN WITH MINERALS) tablet, Take 1 tablet by mouth daily., Disp: , Rfl: ;  sertraline (ZOLOFT) 50 MG tablet, Take 1 tablet (50 mg total) by mouth daily. PATIENT NEEDS OFFICE VISIT FOR ADDITIONAL REFILLS, Disp: 30 tablet, Rfl: 0 rosuvastatin (CRESTOR) 5 MG tablet, Take 1 tablet (5 mg total) by mouth daily., Disp: 30 tablet, Rfl: 11  Review of Systems  HENT: Positive for dental problem. Negative for drooling.   Musculoskeletal: Positive for back pain.  All other systems reviewed and are negative.  Objective:   Physical Exam CONSTITUTIONAL: Well developed/well  nourished HEAD: Normocephalic/atraumatic EYES: EOMI/PERRL.   ENMT: Mucous membranes moist. Mild irritation around gum to L molars. NECK: supple no meningeal signs SPINE:entire spine nontender CV: S1/S2 noted, no murmurs/rubs/gallops noted LUNGS: Lungs are clear to auscultation bilaterally, no apparent distress ABDOMEN: soft, nontender, no rebound or guarding GU:no cva tenderness NEURO: Pt is awake/alert, moves all extremitiesx4 EXTREMITIES: pulses normal, full ROM SKIN: 1 x 1.5 cm cyst to midback over T12. PSYCH: no abnormalities of mood  noted  Triage Vitals:BP 124/80  Pulse 100  Temp(Src) 99.1 F (37.3 C)  Resp 16  Ht 5' 2.5" (1.588 m)  Wt 127 lb (57.607 kg)  BMI 22.84 kg/m2  SpO2 96% Assessment & Plan:  Place the patient on clindamycin because of some recent dental issues. She is going to followup with her new dentist. Burnis Medin try Zetia 10 mg. Daily to  see if it helps with her hyperlipidemia. She is seeing Dr. Nevada Crane in the past as a dermatologist he will evaluate her for her cyst on the mid back. I personally performed the services described in this documentation, which was scribed in my presence. The recorded information has been reviewed and is accurate.

## 2013-10-23 NOTE — Progress Notes (Deleted)
   Subjective:    Patient ID: Beth Rice, female    DOB: April 21, 1956, 58 y.o.   MRN: [id number]  HPI    Review of Systems  Constitutional: Positive for appetite change.  HENT: Positive for dental problem, sinus pressure and sneezing.   Musculoskeletal: Positive for back pain.  Allergic/Immunologic: Positive for environmental allergies.       Objective:   Physical Exam        Assessment & Plan:

## 2013-10-24 ENCOUNTER — Encounter: Payer: Self-pay | Admitting: *Deleted

## 2013-10-25 DIAGNOSIS — M545 Low back pain, unspecified: Secondary | ICD-10-CM | POA: Insufficient documentation

## 2013-10-25 LAB — HEMOGLOBIN A1C
Hgb A1c MFr Bld: 5.8 % — ABNORMAL HIGH (ref <5.7)
MEAN PLASMA GLUCOSE: 120 mg/dL — AB (ref <117)

## 2013-11-07 ENCOUNTER — Other Ambulatory Visit: Payer: Self-pay | Admitting: Emergency Medicine

## 2013-11-07 NOTE — Telephone Encounter (Signed)
Dr Everlene Farrier, you just saw this pt for check up, but not for these meds. OK to give RFs?

## 2013-11-09 ENCOUNTER — Other Ambulatory Visit: Payer: Self-pay | Admitting: Emergency Medicine

## 2013-11-20 ENCOUNTER — Ambulatory Visit (INDEPENDENT_AMBULATORY_CARE_PROVIDER_SITE_OTHER): Payer: BLUE CROSS/BLUE SHIELD | Admitting: Family Medicine

## 2013-11-20 VITALS — BP 116/68 | HR 88 | Temp 98.4°F | Resp 18 | Ht 62.0 in | Wt 125.2 lb

## 2013-11-20 DIAGNOSIS — J0100 Acute maxillary sinusitis, unspecified: Secondary | ICD-10-CM

## 2013-11-20 MED ORDER — AZITHROMYCIN 250 MG PO TABS: ORAL_TABLET | ORAL | Status: DC

## 2013-11-20 NOTE — Patient Instructions (Signed)
Sinusitis Sinusitis is redness, soreness, and swelling (inflammation) of the paranasal sinuses. Paranasal sinuses are air pockets within the bones of your face (beneath the eyes, the middle of the forehead, or above the eyes). In healthy paranasal sinuses, mucus is able to drain out, and air is able to circulate through them by way of your nose. However, when your paranasal sinuses are inflamed, mucus and air can become trapped. This can allow bacteria and other germs to grow and cause infection. Sinusitis can develop quickly and last only a short time (acute) or continue over a long period (chronic). Sinusitis that lasts for more than 12 weeks is considered chronic.  CAUSES  Causes of sinusitis include:  Allergies.  Structural abnormalities, such as displacement of the cartilage that separates your nostrils (deviated septum), which can decrease the air flow through your nose and sinuses and affect sinus drainage.  Functional abnormalities, such as when the small hairs (cilia) that line your sinuses and help remove mucus do not work properly or are not present. SYMPTOMS  Symptoms of acute and chronic sinusitis are the same. The primary symptoms are pain and pressure around the affected sinuses. Other symptoms include:  Upper toothache.  Earache.  Headache.  Bad breath.  Decreased sense of smell and taste.  A cough, which worsens when you are lying flat.  Fatigue.  Fever.  Thick drainage from your nose, which often is green and may contain pus (purulent).  Swelling and warmth over the affected sinuses. DIAGNOSIS  Your caregiver will perform a physical exam. During the exam, your caregiver may:  Look in your nose for signs of abnormal growths in your nostrils (nasal polyps).  Tap over the affected sinus to check for signs of infection.  View the inside of your sinuses (endoscopy) with a special imaging device with a light attached (endoscope), which is inserted into your  sinuses. If your caregiver suspects that you have chronic sinusitis, one or more of the following tests may be recommended:  Allergy tests.  Nasal culture--A sample of mucus is taken from your nose and sent to a lab and screened for bacteria.  Nasal cytology--A sample of mucus is taken from your nose and examined by your caregiver to determine if your sinusitis is related to an allergy. TREATMENT  Most cases of acute sinusitis are related to a viral infection and will resolve on their own within 10 days. Sometimes medicines are prescribed to help relieve symptoms (pain medicine, decongestants, nasal steroid sprays, or saline sprays).  However, for sinusitis related to a bacterial infection, your caregiver will prescribe antibiotic medicines. These are medicines that will help kill the bacteria causing the infection.  Rarely, sinusitis is caused by a fungal infection. In theses cases, your caregiver will prescribe antifungal medicine. For some cases of chronic sinusitis, surgery is needed. Generally, these are cases in which sinusitis recurs more than 3 times per year, despite other treatments. HOME CARE INSTRUCTIONS   Drink plenty of water. Water helps thin the mucus so your sinuses can drain more easily.  Use a humidifier.  Inhale steam 3 to 4 times a day (for example, sit in the bathroom with the shower running).  Apply a warm, moist washcloth to your face 3 to 4 times a day, or as directed by your caregiver.  Use saline nasal sprays to help moisten and clean your sinuses.  Take over-the-counter or prescription medicines for pain, discomfort, or fever only as directed by your caregiver. SEEK IMMEDIATE MEDICAL CARE IF:    You have increasing pain or severe headaches.  You have nausea, vomiting, or drowsiness.  You have swelling around your face.  You have vision problems.  You have a stiff neck.  You have difficulty breathing. MAKE SURE YOU:   Understand these  instructions.  Will watch your condition.  Will get help right away if you are not doing well or get worse. Document Released: 05/17/2005 Document Revised: 08/09/2011 Document Reviewed: 06/01/2011 Exit[employer] Patient Information 2015 [employer]. This information is not intended to replace advice given to you by your health care provider. Make sure you discuss any questions you have with your health care provider.  

## 2013-11-20 NOTE — Progress Notes (Signed)
Patient ID: Beth Rice MRN: [id number], DOB: 17-Nov-1955, 58 y.o. Date of Encounter: 11/20/2013, 9:21 AM  Primary Physician: [name], MD  Chief Complaint:  Chief Complaint  Patient presents with  . Sinus Problem    2x weeks  . Otalgia    l ear x 2 weeks    HPI: 58 y.o. year old female presents with 14 day history of nasal congestion, post nasal drip, sore throat, sinus pressure, and cough. Afebrile. No chills. Nasal congestion thick and green/yellow. Sinus pressure is the worst symptom.  Ears feel full, leading to sensation of muffled hearing. Has tried OTC cold preps without success. No GI complaints.  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 Patient has been running fevers at night.  Not taking flonase regularly  No recent antibiotics, recent travels, vomiting, or sick contacts   No leg trauma, sedentary periods, h/o cancer, or tobacco use.  Past Medical History  Diagnosis Date  . Allergy   . Anxiety   . Elevated cholesterol   . Depression      Home Meds: Prior to Admission medications   Medication Sig Start Date End Date Taking? Authorizing Provider  ALPRAZolam (XANAX) 0.5 MG tablet TAKE 1 TABLET EVERY 6 HOURS AS NEEDED FOR MUSCLE SPASMS   Yes Darlyne Russian, MD  aspirin 81 MG tablet Take 81 mg by mouth daily.   Yes Historical Provider, MD  b complex vitamins capsule Take 1 capsule by mouth daily.   Yes Historical Provider, MD  Calcium Carbonate-Vitamin D (CALCIUM + D PO) Take by mouth.   Yes Historical Provider, MD  clonazePAM (KLONOPIN) 1 MG tablet TAKE 1 TABLET BY MOUTH AT BEDTIME AS NEEDED FOR ANXIETY   Yes Darlyne Russian, MD  ezetimibe (ZETIA) 10 MG tablet Take 1 tablet (10 mg total) by mouth daily. 10/23/13  Yes Darlyne Russian, MD  fish oil-omega-3 fatty acids 1000 MG capsule Take 2 g by mouth daily.   Yes Historical Provider, MD  fluticasone (FLONASE) 50 MCG/ACT nasal spray Place 2 sprays into both  nostrils daily. 11/07/13  Yes Darlyne Russian, MD  loratadine (CLARITIN) 10 MG tablet Take 10 mg by mouth daily.   Yes Historical Provider, MD  Lysine 500 MG CAPS Take by mouth.   Yes Historical Provider, MD  Multiple Vitamins-Minerals (MULTIVITAMIN WITH MINERALS) tablet Take 1 tablet by mouth daily.   Yes Historical Provider, MD  sertraline (ZOLOFT) 50 MG tablet Take 1 tablet (50 mg total) by mouth daily. 11/07/13  Yes Darlyne Russian, MD  azithromycin (ZITHROMAX Z-PAK) 250 MG tablet Take as directed on pack 11/20/13   Robyn Haber, MD    Allergies:  Allergies  Allergen Reactions  . Penicillins Hives  . Statins     Patient has significant muscle cramps with statins. She cannot tolerate them.  . Sulfa Antibiotics Hives    History   Social History  . Marital Status: Single    Spouse Name: N/A    Number of Children: N/A  . Years of Education: N/A   Occupational History  . Not on file.   Social History Main Topics  . Smoking status: Former Smoker -- 1.00 packs/day    Types: Cigarettes  . Smokeless tobacco: Not on file  . Alcohol Use: Yes     Comment: social  . Drug Use: No  . Sexual Activity: No   Other Topics Concern  . Not on file  Social History Narrative  . No narrative on file     Review of Systems: Constitutional: negative for chills, fever, night sweats or weight changes Cardiovascular: negative for chest pain or palpitations Respiratory: negative for hemoptysis, wheezing, or shortness of breath Abdominal: negative for abdominal pain, nausea, vomiting or diarrhea Dermatological: negative for rash Neurologic: negative for headache   Physical Exam: Blood pressure 116/68, pulse 88, temperature 98.4 F (36.9 C), temperature source Oral, resp. rate 18, height 5\' 2"  (1.575 m), weight 125 lb 3.2 oz (56.79 kg), SpO2 98.00%., Body mass index is 22.89 kg/(m^2). General: Well developed, well nourished, in no acute distress. Head: Normocephalic, atraumatic, eyes without  discharge, sclera non-icteric, nares are congested. Bilateral auditory canals clear, TM's are without perforation, pearly grey with reflective cone of light bilaterally. Serous effusion bilaterally behind TM's. Maxillary sinus TTP. Oral cavity moist, dentition normal. Posterior pharynx with post nasal drip and mild erythema. No peritonsillar abscess or tonsillar exudate. Neck: Supple. No thyromegaly. Full ROM. No lymphadenopathy. Lungs: Clear bilaterally to auscultation without wheezes, rales, or rhonchi. Breathing is unlabored.  Heart: RRR with S1 S2. No murmurs, rubs, or gallops appreciated. Msk:  Strength and tone normal for age. Extremities: No clubbing or cyanosis. No edema. Neuro: Alert and oriented X 3. Moves all extremities spontaneously. CNII-XII grossly in tact. Psych:  Responds to questions appropriately with a normal affect.     ASSESSMENT AND PLAN:  58 y.o. year old female with sinusitis -Acute maxillary sinusitis, recurrence not specified - Plan: azithromycin (ZITHROMAX Z-PAK) 250 MG tablet    -Tylenol/Motrin prn -Rest/fluids -RTC precautions -RTC 3-5 days if no improvement  Signed, Robyn Haber, MD 11/20/2013 9:21 AM

## 2013-12-14 ENCOUNTER — Other Ambulatory Visit: Payer: Self-pay | Admitting: Emergency Medicine

## 2013-12-16 NOTE — Telephone Encounter (Signed)
Faxed

## 2014-03-13 ENCOUNTER — Other Ambulatory Visit: Payer: Self-pay | Admitting: Emergency Medicine

## 2014-03-13 NOTE — Telephone Encounter (Signed)
Rx faxed

## 2014-03-17 ENCOUNTER — Telehealth: Payer: Self-pay

## 2014-03-17 ENCOUNTER — Other Ambulatory Visit: Payer: Self-pay | Admitting: Emergency Medicine

## 2014-03-18 NOTE — Telephone Encounter (Signed)
Script has been faxed to pharmacy

## 2014-03-25 ENCOUNTER — Telehealth: Payer: Self-pay

## 2014-03-25 NOTE — Telephone Encounter (Signed)
Rx was faxed on 10/14- [place].

## 2014-03-25 NOTE — Telephone Encounter (Signed)
Pt states she put in a request for her medicine, received the XANAX but not the [facility] . Please call [phone]

## 2014-03-25 NOTE — Telephone Encounter (Signed)
This was called into pharmacy- pt advised.

## 2014-05-06 ENCOUNTER — Other Ambulatory Visit: Payer: Self-pay | Admitting: Emergency Medicine

## 2014-05-07 ENCOUNTER — Other Ambulatory Visit: Payer: Self-pay | Admitting: Emergency Medicine

## 2014-05-08 ENCOUNTER — Other Ambulatory Visit: Payer: Self-pay | Admitting: Emergency Medicine

## 2014-05-20 ENCOUNTER — Other Ambulatory Visit: Payer: Self-pay | Admitting: Emergency Medicine

## 2014-06-03 ENCOUNTER — Emergency Department (HOSPITAL_COMMUNITY): Payer: BC Managed Care – PPO

## 2014-06-03 ENCOUNTER — Encounter (HOSPITAL_COMMUNITY): Payer: Self-pay | Admitting: Emergency Medicine

## 2014-06-03 ENCOUNTER — Observation Stay (HOSPITAL_COMMUNITY)
Admission: EM | Admit: 2014-06-03 | Discharge: 2014-06-05 | Disposition: A | Discharge status: Home / Self Care | Payer: BC Managed Care – PPO | Attending: Oncology | Admitting: Oncology

## 2014-06-03 DIAGNOSIS — R042 Hemoptysis: Secondary | ICD-10-CM

## 2014-06-03 DIAGNOSIS — Z7951 Long term (current) use of inhaled steroids: Secondary | ICD-10-CM | POA: Diagnosis not present

## 2014-06-03 DIAGNOSIS — E785 Hyperlipidemia, unspecified: Secondary | ICD-10-CM

## 2014-06-03 DIAGNOSIS — K219 Gastro-esophageal reflux disease without esophagitis: Secondary | ICD-10-CM | POA: Diagnosis not present

## 2014-06-03 DIAGNOSIS — E780 Pure hypercholesterolemia: Secondary | ICD-10-CM | POA: Diagnosis not present

## 2014-06-03 DIAGNOSIS — R197 Diarrhea, unspecified: Secondary | ICD-10-CM | POA: Insufficient documentation

## 2014-06-03 DIAGNOSIS — Z9889 Other specified postprocedural states: Secondary | ICD-10-CM | POA: Diagnosis not present

## 2014-06-03 DIAGNOSIS — F329 Major depressive disorder, single episode, unspecified: Secondary | ICD-10-CM | POA: Diagnosis not present

## 2014-06-03 DIAGNOSIS — R110 Nausea: Secondary | ICD-10-CM | POA: Insufficient documentation

## 2014-06-03 DIAGNOSIS — R0602 Shortness of breath: Secondary | ICD-10-CM | POA: Diagnosis not present

## 2014-06-03 DIAGNOSIS — Z7982 Long term (current) use of aspirin: Secondary | ICD-10-CM | POA: Insufficient documentation

## 2014-06-03 DIAGNOSIS — Z880 Allergy status to penicillin: Secondary | ICD-10-CM | POA: Diagnosis not present

## 2014-06-03 DIAGNOSIS — F419 Anxiety disorder, unspecified: Secondary | ICD-10-CM

## 2014-06-03 DIAGNOSIS — R739 Hyperglycemia, unspecified: Secondary | ICD-10-CM

## 2014-06-03 DIAGNOSIS — R079 Chest pain, unspecified: Secondary | ICD-10-CM | POA: Diagnosis not present

## 2014-06-03 DIAGNOSIS — I252 Old myocardial infarction: Secondary | ICD-10-CM | POA: Diagnosis not present

## 2014-06-03 DIAGNOSIS — R0789 Other chest pain: Secondary | ICD-10-CM | POA: Diagnosis not present

## 2014-06-03 DIAGNOSIS — R112 Nausea with vomiting, unspecified: Secondary | ICD-10-CM

## 2014-06-03 DIAGNOSIS — Z87891 Personal history of nicotine dependence: Secondary | ICD-10-CM | POA: Diagnosis not present

## 2014-06-03 DIAGNOSIS — R072 Precordial pain: Secondary | ICD-10-CM

## 2014-06-03 DIAGNOSIS — Z79899 Other long term (current) drug therapy: Secondary | ICD-10-CM | POA: Insufficient documentation

## 2014-06-03 HISTORY — DX: Tobacco use: Z72.0

## 2014-06-03 HISTORY — DX: Other chronic pain: G89.29

## 2014-06-03 HISTORY — DX: Acute myocardial infarction, unspecified: I21.9

## 2014-06-03 HISTORY — DX: Low back pain: M54.5

## 2014-06-03 HISTORY — DX: Low back pain, unspecified: M54.50

## 2014-06-03 HISTORY — DX: Headache, unspecified: R51.9

## 2014-06-03 HISTORY — DX: Unspecified osteoarthritis, unspecified site: M19.90

## 2014-06-03 HISTORY — DX: Headache: R51

## 2014-06-03 HISTORY — DX: Personal history of other diseases of the musculoskeletal system and connective tissue: Z87.39

## 2014-06-03 HISTORY — DX: Chest pain, unspecified: R07.9

## 2014-06-03 LAB — CBC
HCT: 39.4 % (ref 36.0–46.0)
HEMOGLOBIN: 13.7 g/dL (ref 12.0–15.0)
MCH: 33 pg (ref 26.0–34.0)
MCHC: 34.8 g/dL (ref 30.0–36.0)
MCV: 94.9 fL (ref 78.0–100.0)
Platelets: 211 10*3/uL (ref 150–400)
RBC: 4.15 MIL/uL (ref 3.87–5.11)
RDW: 12.3 % (ref 11.5–15.5)
WBC: 5.4 10*3/uL (ref 4.0–10.5)

## 2014-06-03 LAB — BASIC METABOLIC PANEL
Anion gap: 7 (ref 5–15)
BUN: 7 mg/dL (ref 6–23)
CO2: 28 mmol/L (ref 19–32)
CREATININE: 0.64 mg/dL (ref 0.50–1.10)
Calcium: 9.2 mg/dL (ref 8.4–10.5)
Chloride: 103 mEq/L (ref 96–112)
GFR calc Af Amer: >90 mL/min (ref >90)
GLUCOSE: 176 mg/dL — AB (ref 70–99)
Potassium: 3.6 mmol/L (ref 3.5–5.1)
SODIUM: 138 mmol/L (ref 135–145)

## 2014-06-03 LAB — I-STAT TROPONIN, ED: Troponin i, poc: 0 ng/mL (ref 0.00–0.08)

## 2014-06-03 LAB — BRAIN NATRIURETIC PEPTIDE: B Natriuretic Peptide: 18.4 pg/mL (ref 0.0–100.0)

## 2014-06-03 LAB — TROPONIN I: Troponin I: <0.03 ng/mL (ref <0.031)

## 2014-06-03 LAB — GLUCOSE, CAPILLARY: GLUCOSE-CAPILLARY: 96 mg/dL (ref 70–99)

## 2014-06-03 MED ORDER — ASPIRIN 81 MG PO TABS: 81.0000 mg | ORAL_TABLET | Freq: Every day | ORAL | Status: DC

## 2014-06-03 MED ORDER — MORPHINE SULFATE 4 MG/ML IJ SOLN
4.0000 mg | Freq: Once | INTRAMUSCULAR | Status: AC
  Administered 2014-06-03: 4 mg via INTRAVENOUS
  Filled 2014-06-03: qty 1

## 2014-06-03 MED ORDER — SODIUM CHLORIDE 0.9 % IV SOLN
INTRAVENOUS | Status: AC
  Administered 2014-06-03 – 2014-06-04 (×3): via INTRAVENOUS

## 2014-06-03 MED ORDER — ACETAMINOPHEN 325 MG PO TABS
650.0000 mg | ORAL_TABLET | ORAL | Status: DC | PRN
  Administered 2014-06-03 – 2014-06-04 (×3): 650 mg via ORAL
  Filled 2014-06-03 (×3): qty 2

## 2014-06-03 MED ORDER — ONDANSETRON HCL 4 MG/2ML IJ SOLN
4.0000 mg | Freq: Four times a day (QID) | INTRAMUSCULAR | Status: DC | PRN
  Administered 2014-06-04: 4 mg via INTRAVENOUS
  Filled 2014-06-03: qty 2

## 2014-06-03 MED ORDER — MORPHINE SULFATE 2 MG/ML IJ SOLN: 2.0000 mg | INTRAMUSCULAR | Status: DC | PRN

## 2014-06-03 MED ORDER — NITROGLYCERIN 2 % TD OINT
1.0000 [in_us] | TOPICAL_OINTMENT | Freq: Once | TRANSDERMAL | Status: AC
  Administered 2014-06-03: 1 [in_us] via TOPICAL
  Filled 2014-06-03: qty 1

## 2014-06-03 MED ORDER — CLONAZEPAM 1 MG PO TABS
1.0000 mg | ORAL_TABLET | Freq: Every evening | ORAL | Status: DC | PRN
  Administered 2014-06-03 – 2014-06-04 (×2): 1 mg via ORAL
  Filled 2014-06-03 (×2): qty 1

## 2014-06-03 MED ORDER — ENOXAPARIN SODIUM 40 MG/0.4ML ~~LOC~~ SOLN
40.0000 mg | SUBCUTANEOUS | Status: DC
  Administered 2014-06-03: 40 mg via SUBCUTANEOUS
  Filled 2014-06-03: qty 0.4

## 2014-06-03 MED ORDER — ASPIRIN 81 MG PO CHEW
324.0000 mg | CHEWABLE_TABLET | Freq: Once | ORAL | Status: AC
  Administered 2014-06-03: 324 mg via ORAL
  Filled 2014-06-03: qty 4

## 2014-06-03 MED ORDER — GI COCKTAIL ~~LOC~~
30.0000 mL | Freq: Once | ORAL | Status: AC
  Administered 2014-06-03: 30 mL via ORAL
  Filled 2014-06-03: qty 30

## 2014-06-03 MED ORDER — ALPRAZOLAM 0.5 MG PO TABS
0.5000 mg | ORAL_TABLET | Freq: Four times a day (QID) | ORAL | Status: DC | PRN
  Administered 2014-06-03 – 2014-06-04 (×2): 0.5 mg via ORAL
  Filled 2014-06-03 (×2): qty 1

## 2014-06-03 MED ORDER — SERTRALINE HCL 50 MG PO TABS
50.0000 mg | ORAL_TABLET | Freq: Every day | ORAL | Status: DC
  Administered 2014-06-04 – 2014-06-05 (×2): 50 mg via ORAL
  Filled 2014-06-03 (×2): qty 1

## 2014-06-03 MED ORDER — SODIUM CHLORIDE 0.9 % IV BOLUS (SEPSIS)
1000.0000 mL | Freq: Once | INTRAVENOUS | Status: AC
Start: 2014-06-03 — End: 2014-06-03
  Administered 2014-06-03: 1000 mL via INTRAVENOUS

## 2014-06-03 MED ORDER — NITROGLYCERIN 0.4 MG SL SUBL
0.4000 mg | SUBLINGUAL_TABLET | SUBLINGUAL | Status: DC | PRN
Start: 2014-06-03 — End: 2014-06-05
  Administered 2014-06-03: 0.4 mg via SUBLINGUAL
  Filled 2014-06-03: qty 1

## 2014-06-03 MED ORDER — ASPIRIN 81 MG PO CHEW
81.0000 mg | CHEWABLE_TABLET | Freq: Every day | ORAL | Status: DC
  Administered 2014-06-04 – 2014-06-05 (×2): 81 mg via ORAL
  Filled 2014-06-03 (×2): qty 1

## 2014-06-03 MED ORDER — GI COCKTAIL ~~LOC~~
30.0000 mL | Freq: Four times a day (QID) | ORAL | Status: DC | PRN
  Administered 2014-06-03: 30 mL via ORAL
  Filled 2014-06-03: qty 30

## 2014-06-03 NOTE — ED Notes (Signed)
Patient transported to X-ray 

## 2014-06-03 NOTE — Consult Note (Signed)
Patient ID: Beth Rice MRN: [id number], DOB/AGE: 1955-12-10   Admit date: 06/03/2014  Primary Physician: [name], MD Primary Cardiologist: Corky Downs, MD   Pt. Profile:  59 y/o female with a h/o chest pain an non-obstructive CAD who presented to the ED today with a 4 day h/o nausea, vomiting, diarrhea, and constant chest pain.  Problem List  Past Medical History  Diagnosis Date  . Allergy   . Anxiety   . Elevated cholesterol   . Depression   . Chest pain     a. 09/2008 Cath: ER 60%, LM nl, LAD 70m, 72m/d, LCX nondom, nl, RCA dom, 59m, PDA/PLA nl.  . Tobacco abuse   . Chronic back pain   . H/O degenerative disc disease     Past Surgical History  Procedure Laterality Date  . Ovarian cyst removal    . Tonsillectomy    . Cardiac catheterization      Allergies  Allergies  Allergen Reactions  . Penicillins Hives  . Statins     Patient has significant muscle cramps with statins. She cannot tolerate them.  . Sulfa Antibiotics Hives   HPI  59 year old female with the above problem list. She does have a history of chest pain dating back to 2010, at which time she underwent diagnostic cardiac catheterization revealing nonobstructive coronary artery disease. She was managed medically. She says that over the years, she has had intermittent chest discomfort occurring several times per month, usually with stress or anxiety, and resolving spontaneously. She does not typically have exertional symptoms, however is quick to note that her activities are limited secondary to chronic low back pain. She apparently has a very stressful job and this sometimes exacerbates her symptoms.  She was in her usual state of health until last Friday, January 1, when she began to experience nausea and vomiting. After multiple vomiting episodes, she began to experience chest discomfort that was worse with each vomiting episode but remained even after vomiting episodes. Chest pain has been constant  since Friday. Discomfort is somewhat worse with deep breathing but does not change with palpation. She has not exerted herself. She has also continued to have nausea with multiple vomiting episodes each day, along with diarrhea since Friday. In this setting, she has not been able to keep anything down and her appetite has waned. This morning, her chest discomfort moved into her throat and neck and as a result she presented to the ED for evaluation. Here ECG is nonacute. Despite prolonged symptoms, initial troponin is normal.  She continues to have mild c/p.  Home Medications  Prior to Admission medications   Medication Sig Start Date End Date Taking? Authorizing Provider  ALPRAZolam Duanne Moron) 0.5 MG tablet TAKE 1 TABLET BY MOUTH EVERY 6 HOURS AS NEEDED FOR MUSCLE SPASMS 03/18/14   Darlyne Russian, MD  aspirin 81 MG tablet Take 81 mg by mouth daily.    Historical Provider, MD  b complex vitamins capsule Take 1 capsule by mouth daily.    Historical Provider, MD  Calcium Carbonate-Vitamin D (CALCIUM + D PO) Take by mouth.    Historical Provider, MD  clonazePAM (KLONOPIN) 1 MG tablet TAKE 1 TABLET BY MOUTH AT BEDTIME AS NEEDED FOR ANXIETY 03/13/14   Darlyne Russian, MD  ezetimibe (ZETIA) 10 MG tablet Take 1 tablet (10 mg total) by mouth daily. 10/23/13   Darlyne Russian, MD  fish oil-omega-3 fatty acids 1000 MG capsule Take 2 g by mouth daily.  Historical Provider, MD  fluticasone (FLONASE) 50 MCG/ACT nasal spray Place 2 sprays into both nostrils daily. PATIENT NEEDS OFFICE VISIT FOR ADDITIONAL REFILLS 05/07/14   Darlyne Russian, MD  loratadine (CLARITIN) 10 MG tablet Take 10 mg by mouth daily.    Historical Provider, MD  Lysine 500 MG CAPS Take by mouth.    Historical Provider, MD  Multiple Vitamins-Minerals (MULTIVITAMIN WITH MINERALS) tablet Take 1 tablet by mouth daily.    Historical Provider, MD  sertraline (ZOLOFT) 50 MG tablet Take 1 tablet (50 mg total) by mouth daily. PATIENT NEEDS OFFICE VISIT FOR  ADDITIONAL REFILLS 05/07/14   Darlyne Russian, MD   Family History  Family History  Problem Relation Age of Onset  . Hypertension Sister   . Heart disease Mother   . Alzheimer's disease Mother   . Cerebral aneurysm Father   . Mental illness Father     Social History  History   Social History  . Marital Status: Single    Spouse Name: N/A    Number of Children: N/A  . Years of Education: N/A   Occupational History  . Not on file.   Social History Main Topics  . Smoking status: Current Every Day Smoker -- 1.00 packs/day for 25 years    Types: Cigarettes  . Smokeless tobacco: Not on file  . Alcohol Use: No  . Drug Use: No  . Sexual Activity: No   Other Topics Concern  . Not on file   Social History Narrative   Lives in [place] with her 4 dogs.  She does not routinely exercise as activity is limited by chronic back pain.     Review of Systems General:  +++ chills, no fever, night sweats or weight changes.  Cardiovascular:  +++ chest pain, +++ dyspnea on exertion, no edema, orthopnea, palpitations, paroxysmal nocturnal dyspnea. Dermatological: No rash, lesions/masses Respiratory: No cough, +++ dyspnea Urologic: No hematuria, dysuria Abdominal:   +++ nausea, vomiting, diarrhea, no bright red blood per rectum, melena, or hematemesis Neurologic:  No visual changes, wkns, changes in mental status. All other systems reviewed and are otherwise negative except as noted above.  Physical Exam  Blood pressure 102/69, pulse 94, temperature 99.1 F (37.3 C), temperature source Oral, resp. rate 14, height 5\' 2"  (1.575 m), weight 120 lb (54.432 kg), SpO2 94 %.  General: Pleasant, NAD Psych: Normal affect. Neuro: Alert and oriented X 3. Moves all extremities spontaneously. HEENT: Normal  Neck: Supple without bruits or JVD. Lungs:  Resp regular and unlabored, CTA. Heart: RRR no s3, s4, or murmurs. Abdomen: Soft, non-tender, non-distended, BS + x 4.  Extremities: No clubbing,  cyanosis or edema. DP/PT/Radials 2+ and equal bilaterally.  Labs  Troponin [facility] Test)  Recent Labs  06/03/14 1213  TROPIPOC 0.00    Lab Results  Component Value Date   WBC 5.4 06/03/2014   HGB 13.7 06/03/2014   HCT 39.4 06/03/2014   MCV 94.9 06/03/2014   PLT 211 06/03/2014     Recent Labs Lab 06/03/14 1203  NA 138  K 3.6  CL 103  CO2 28  BUN 7  CREATININE 0.64  CALCIUM 9.2  GLUCOSE 176*   Lab Results  Component Value Date   CHOL 229* 10/23/2013   HDL 55 10/23/2013   LDLCALC 147* 10/23/2013   TRIG 133 10/23/2013    Radiology/Studies  Dg Chest 2 View  06/03/2014   CLINICAL DATA:  LEFT-sided chest pain for 3 days. Chest pain radiating  into the LEFT neck.  EXAM: CHEST  2 VIEW  COMPARISON:  None.  FINDINGS: The heart size and mediastinal contours are within normal limits. Both lungs are clear. The visualized skeletal structures are unremarkable.  IMPRESSION: No active cardiopulmonary disease.   Electronically Signed   By: Dereck Ligas M.D.   On: 06/03/2014 12:32   ECG  RSR, 99, rightward axis, no acute st/t changes.  ASSESSMENT AND PLAN  1.  Midsternal Chest Pain:  Pt presents with a 4 day h/o nausea, vomiting, and diarrhea.  In that setting, she has had constant chest pain since Friday, that is worse during episodes of vomiting.  Today, c/p moved to neck and jaw prompting her to present to the ED for evaluation.  Here, ECG is non-acute and despite prolonged Ss, troponin is nl.  Suspect Ss are primarily GI in nature.  Recommend IM/GI evaluation.  Provided that troponin remains nl, would consider outpt ETT in the future.  2. Nausea, vomiting, diarrhea:  ? GB dzs (nontender), acute gastroenteritis.  Poor PO intake since Friday with ongoing n, v, d.  Rec IM/GI eval.  Add PPI given c/p - ? GERD.  3.  Hyperglycemia:  Gluc 176.  No prior h/o DM.  Check A1c.  4.  Tob Abuse:  Still smoking up to 1 ppd.  Complete cessation advised.  Signed, Murray Hodgkins,  NP 06/03/2014, 1:52 PM np  Patient examined chart reviewed.  Chest pain from nausea and vohmiting with mild dehydration No signs of acute cardiac event normal exam and ECG  Troponin normal Admit to medical service for hydration and w/u GI symptoms Outpatient f/u with Dr Claiborne Billings and can consider ETT  Jenkins Rouge

## 2014-06-03 NOTE — ED Provider Notes (Signed)
CSN: [id number]     Arrival date & time 06/03/14  1120 History   First MD Initiated Contact with Patient 06/03/14 1124     Chief Complaint  Patient presents with  . Chest Pain  . Weakness  . Neck Pain     (Consider location/radiation/quality/duration/timing/severity/associated sxs/prior Treatment) Patient is a 59 y.o. female presenting with chest pain. The history is provided by the patient.  Chest Pain Pain location:  L chest Pain quality: burning and pressure   Pain radiates to:  Does not radiate Pain radiates to the back: no   Pain severity:  Mild Onset quality:  Gradual Duration:  3 days Timing:  Constant Progression:  Unchanged Chronicity:  New Context: at rest   Context: not breathing, not eating, no stress and no trauma   Relieved by:  Nothing Worsened by:  Nothing tried Associated symptoms: nausea and shortness of breath   Associated symptoms: no cough, no fever and not vomiting     Past Medical History  Diagnosis Date  . Allergy   . Anxiety   . Elevated cholesterol   . Depression   . History of heart attack    Past Surgical History  Procedure Laterality Date  . Ovarian cyst removal    . Tonsillectomy    . Cardiac catheterization     Family History  Problem Relation Age of Onset  . Hypertension Sister   . Heart disease Mother   . Alzheimer's disease Mother   . Cerebral aneurysm Father   . Mental illness Father    History  Substance Use Topics  . Smoking status: Former Smoker -- 1.00 packs/day    Types: Cigarettes  . Smokeless tobacco: Not on file  . Alcohol Use: Yes     Comment: social   OB History    Gravida Para Term Preterm AB TAB SAB Ectopic Multiple Living   0              Review of Systems  Constitutional: Negative for fever and chills.  Respiratory: Positive for shortness of breath. Negative for cough.   Cardiovascular: Positive for chest pain.  Gastrointestinal: Positive for nausea and diarrhea. Negative for vomiting.  All other  systems reviewed and are negative.     Allergies  Penicillins; Statins; and Sulfa antibiotics  Home Medications   Prior to Admission medications   Medication Sig Start Date End Date Taking? Authorizing Provider  ALPRAZolam Duanne Moron) 0.5 MG tablet TAKE 1 TABLET BY MOUTH EVERY 6 HOURS AS NEEDED FOR MUSCLE SPASMS 03/18/14   Darlyne Russian, MD  aspirin 81 MG tablet Take 81 mg by mouth daily.    Historical Provider, MD  azithromycin (ZITHROMAX Z-PAK) 250 MG tablet Take as directed on pack 11/20/13   Robyn Haber, MD  b complex vitamins capsule Take 1 capsule by mouth daily.    Historical Provider, MD  Calcium Carbonate-Vitamin D (CALCIUM + D PO) Take by mouth.    Historical Provider, MD  clonazePAM (KLONOPIN) 1 MG tablet TAKE 1 TABLET BY MOUTH AT BEDTIME AS NEEDED FOR ANXIETY 03/13/14   Darlyne Russian, MD  ezetimibe (ZETIA) 10 MG tablet Take 1 tablet (10 mg total) by mouth daily. 10/23/13   Darlyne Russian, MD  fish oil-omega-3 fatty acids 1000 MG capsule Take 2 g by mouth daily.    Historical Provider, MD  fluticasone (FLONASE) 50 MCG/ACT nasal spray Place 2 sprays into both nostrils daily. PATIENT NEEDS OFFICE VISIT FOR ADDITIONAL REFILLS 05/07/14   [name]  A Daub, MD  loratadine (CLARITIN) 10 MG tablet Take 10 mg by mouth daily.    Historical Provider, MD  Lysine 500 MG CAPS Take by mouth.    Historical Provider, MD  Multiple Vitamins-Minerals (MULTIVITAMIN WITH MINERALS) tablet Take 1 tablet by mouth daily.    Historical Provider, MD  sertraline (ZOLOFT) 50 MG tablet Take 1 tablet (50 mg total) by mouth daily. PATIENT NEEDS OFFICE VISIT FOR ADDITIONAL REFILLS 05/07/14   Darlyne Russian, MD   BP 116/68 mmHg  Pulse 101  Temp(Src) 99.1 F (37.3 C) (Oral)  Resp 22  Ht 5\' 2"  (1.575 m)  Wt 120 lb (54.432 kg)  BMI 21.94 kg/m2  SpO2 97% Physical Exam  Constitutional: She is oriented to person, place, and time. She appears well-developed and well-nourished. No distress.  HENT:  Head: Normocephalic  and atraumatic.  Mouth/Throat: Oropharynx is clear and moist.  Eyes: EOM are normal. Pupils are equal, round, and reactive to light.  Neck: Normal range of motion. Neck supple.  Cardiovascular: Normal rate and regular rhythm.  Exam reveals no friction rub.   No murmur heard. Pulmonary/Chest: Effort normal and breath sounds normal. No respiratory distress. She has no wheezes. She has no rales.  Abdominal: Soft. She exhibits no distension. There is no tenderness. There is no rebound.  Musculoskeletal: Normal range of motion. She exhibits no edema.  Neurological: She is alert and oriented to person, place, and time.  Skin: No rash noted. She is not diaphoretic.  Nursing note and vitals reviewed.   ED Course  Procedures (including critical care time) Labs Review Labs Reviewed  BASIC METABOLIC PANEL - Abnormal; Notable for the following:    Glucose, Bld 176 (*)    All other components within normal limits  CBC  BRAIN NATRIURETIC PEPTIDE  I-STAT TROPOININ, ED    Imaging Review Dg Chest 2 View  06/03/2014   CLINICAL DATA:  LEFT-sided chest pain for 3 days. Chest pain radiating into the LEFT neck.  EXAM: CHEST  2 VIEW  COMPARISON:  None.  FINDINGS: The heart size and mediastinal contours are within normal limits. Both lungs are clear. The visualized skeletal structures are unremarkable.  IMPRESSION: No active cardiopulmonary disease.   Electronically Signed   By: Dereck Ligas M.D.   On: 06/03/2014 12:32     EKG Interpretation None      Date: 06/03/2014  Rate: 99  Rhythm: normal sinus rhythm  QRS Axis: normal  Intervals: normal  ST/T Wave abnormalities: nonspecific T wave changes and anteriorly  Conduction Disutrbances:none  Narrative Interpretation:   Old EKG Reviewed: none available   MDM   Final diagnoses:  Chest pain   59F presents with 3 days of constant L sided chest pain. Worsening, radiating now into her jaw. No alleviating/exacerbating factors. No hx of CAD or  stents. States prior cath about 6-7 years ago, I cannot find records of it. EKG with some anterior T wave flattening, unable to compare to prior ones in the system. Patient with benign exam. Concern for ACS, given aspirin, nitroglycerin. Cards evaluated patient, they felt likely GI and recommended internal medicine eval, GI referral.  Internal Medicine admitting.   Evelina Bucy, MD 06/03/14 1435

## 2014-06-03 NOTE — ED Notes (Signed)
Pt arrives EMS from work where patient has had left sided chest pain off and off for the last 3 days. Today patient had weakness with radiation of pain to neck and jaw area. EKG notes RBB. 20g LAC.

## 2014-06-03 NOTE — ED Notes (Signed)
EDP at bedside  

## 2014-06-03 NOTE — ED Notes (Signed)
Internal med at bedside.

## 2014-06-03 NOTE — Progress Notes (Signed)
Pt reported CP 4/10 after pt pulled up in bed, with subsequent feelings of "sweaty and fainty".  Gi cocktail given, BP 99/59. O2 applied at 2L per Rote and EKG and CBG being obtained.  MD notified with no new orders.  Will continue to monitor.

## 2014-06-03 NOTE — H&P (Signed)
Date: 06/03/2014               Patient Name:  Beth Rice MRN: [id number]  DOB: Jul 29, 1955 Age / Sex: 59 y.o., female   PCP: Darlyne Russian, MD         Medical Service: Internal Medicine Teaching Service         Attending Physician: Dr. Annia Belt, MD    First Contact: Dr. Raelene Bott Pager: [phone]  Second Contact: Dr. Ronnald Ramp Pager: [phone]       After Hours (After 5p/  First Contact Pager: [phone]  weekends / holidays): Second Contact Pager: [phone]   Chief Complaint: chest pain  History of Present Illness:   Patient is a 59 year old female with a history of depression/anxiety and dyslipidemia who presents with several day history of chest pain. Patient states that her chest pain started 3 days ago. She states that the pain is located in the center of her chest above her sternum, and was 7 out of 10 in severity at its worst. Patient states that it is a constant quality that has an sensation of having to burp but not being able to. Patient states that the pain moves around her chest and has most significantly moved up to her neck and her jaw on the left. Patient denies any radiation to her left arm. Patient states that the pain is worsened by both exertion and eating. Patient states that she will have the chest pain when she walks some distance or walks upstairs. Patient states that her chest pain is associated with dyspnea and diaphoresis as well. Patient also states that the pain is worse when she sits up compared to laying down flat. Patient also reports some associated sour taste in her mouth. Patient states that she has taken Advil for her symptoms without any alleviation. Patient denies that the Advil made her pain worse. Patient denies any sick contacts at home but only lives at home with dogs.  Patient is also reporting associated vomiting over the last several days. Patient states that she hasn't been able to keep much food down and is just vomiting gastric juices. However,  this morning, patient noticed some amount of blood but denies any coffee-ground or blood clots emesis. Patient otherwise denying any pleuritic pain, dysphagia, odynophagia, productive cough, hemoptysis, or small rashes, fevers, dysuria, hematuria. Patient does report having a tick bite on the inner aspect of her right thigh but denies any associated rash with the lesion.   Meds: Current Facility-Administered Medications  Medication Dose Route Frequency Provider Last Rate Last Dose  . 0.9 %  sodium chloride infusion   Intravenous Continuous Corky Sox, MD      . acetaminophen (TYLENOL) tablet 650 mg  650 mg Oral Q4H PRN Corky Sox, MD      . ALPRAZolam Duanne Moron) tablet 0.5 mg  0.5 mg Oral Q6H PRN Corky Sox, MD      . Derrill Memo ON 06/04/2014] aspirin tablet 81 mg  81 mg Oral Daily Corky Sox, MD      . clonazePAM [facility]) tablet 1 mg  1 mg Oral QHS PRN Corky Sox, MD      . enoxaparin (LOVENOX) injection 40 mg  40 mg Subcutaneous Q24H Corky Sox, MD      . gi cocktail (Maalox,Lidocaine,Donnatal)  30 mL Oral QID PRN Corky Sox, MD      . nitroGLYCERIN (NITROSTAT) SL tablet 0.4 mg  0.4 mg Sublingual Q5  min PRN Evelina Bucy, MD   0.4 mg at 06/03/14 1157  . ondansetron (ZOFRAN) injection 4 mg  4 mg Intravenous Q6H PRN Corky Sox, MD      . Derrill Memo ON 06/04/2014] sertraline (ZOLOFT) tablet 50 mg  50 mg Oral Daily Corky Sox, MD        Past Medical History  Diagnosis Date  . Allergy   . Anxiety   . Elevated cholesterol   . Depression   . Chest pain     a. 09/2008 Cath: ER 60%, LM nl, LAD 68m, 27m/d, LCX nondom, nl, RCA dom, 7m, PDA/PLA nl.  . Tobacco abuse   . Chronic back pain   . H/O degenerative disc disease    Past Surgical History  Procedure Laterality Date  . Ovarian cyst removal    . Tonsillectomy    . Cardiac catheterization      Allergies: Allergies as of 06/03/2014 - Review Complete 06/03/2014  Allergen Reaction Noted  . Penicillins Hives 09/01/2011  . Statins   10/23/2013  . Sulfa antibiotics Hives 09/01/2011   Family History  Problem Relation Age of Onset  . Hypertension Sister   . Heart disease Mother   . Alzheimer's disease Mother   . Cerebral aneurysm Father   . Mental illness Father    History   Social History  . Marital Status: Single    Spouse Name: N/A    Number of Children: N/A  . Years of Education: N/A   Occupational History  . Not on file.   Social History Main Topics  . Smoking status: Current Every Day Smoker -- 1.00 packs/day for 25 years    Types: Cigarettes  . Smokeless tobacco: Not on file  . Alcohol Use: No  . Drug Use: No  . Sexual Activity: No   Other Topics Concern  . Not on file   Social History Narrative   Lives in [place] with her 4 dogs.  She does not routinely exercise as activity is limited by chronic back pain.    Review of Systems: All pertinent ROS as stated in HPI.   Physical Exam: Blood pressure 99/63, pulse 85, temperature 99.1 F (37.3 C), temperature source Oral, resp. rate 15, height 5\' 2"  (1.575 m), weight 54.432 kg (120 lb), SpO2 95 %. General: resting in bed HEENT: PERRL, EOMI, no scleral icterus Cardiac: RRR, no rubs, murmurs or gallops, no chest wall rashes, no tenderness to palpation Pulm: clear to auscultation bilaterally, moving normal volumes of air Abd: soft, mild tenderness to palpation in the left mid abdomen, nondistended, BS present Ext: warm and well perfused, no pedal edema Neuro: alert and oriented X3, cranial nerves II-XII grossly intact Skin: no rashes or lesions noted Psych: appropriate affect  Lab results: Basic Metabolic Panel:  Recent Labs  06/03/14 1203  NA 138  K 3.6  CL 103  CO2 28  GLUCOSE 176*  BUN 7  CREATININE 0.64  CALCIUM 9.2   Liver Function Tests: No results for input(s): AST, ALT, ALKPHOS, BILITOT, PROT, ALBUMIN in the last 72 hours. No results for input(s): LIPASE, AMYLASE in the last 72 hours. No results for input(s): AMMONIA in the  last 72 hours. CBC:  Recent Labs  06/03/14 1203  WBC 5.4  HGB 13.7  HCT 39.4  MCV 94.9  PLT 211   Cardiac Enzymes: No results for input(s): CKTOTAL, CKMB, CKMBINDEX, TROPONINI in the last 72 hours. BNP: No results for input(s): PROBNP in the last 72 hours.  D-Dimer: No results for input(s): DDIMER in the last 72 hours. CBG: No results for input(s): GLUCAP in the last 72 hours. Hemoglobin A1C: No results for input(s): HGBA1C in the last 72 hours. Fasting Lipid Panel: No results for input(s): CHOL, HDL, LDLCALC, TRIG, CHOLHDL, LDLDIRECT in the last 72 hours. Thyroid Function Tests: No results for input(s): TSH, T4TOTAL, FREET4, T3FREE, THYROIDAB in the last 72 hours. Anemia Panel: No results for input(s): VITAMINB12, FOLATE, FERRITIN, TIBC, IRON, RETICCTPCT in the last 72 hours. Coagulation: No results for input(s): LABPROT, INR in the last 72 hours. Urine Drug Screen: Drugs of Abuse  No results found for: LABOPIA, COCAINSCRNUR, LABBENZ, AMPHETMU, THCU, LABBARB  Alcohol Level: No results for input(s): ETH in the last 72 hours. Urinalysis: No results for input(s): COLORURINE, LABSPEC, PHURINE, GLUCOSEU, HGBUR, BILIRUBINUR, KETONESUR, PROTEINUR, UROBILINOGEN, NITRITE, LEUKOCYTESUR in the last 72 hours.  Invalid input(s): APPERANCEUR  Imaging results:  Dg Chest 2 View  06/03/2014   CLINICAL DATA:  LEFT-sided chest pain for 3 days. Chest pain radiating into the LEFT neck.  EXAM: CHEST  2 VIEW  COMPARISON:  None.  FINDINGS: The heart size and mediastinal contours are within normal limits. Both lungs are clear. The visualized skeletal structures are unremarkable.  IMPRESSION: No active cardiopulmonary disease.   Electronically Signed   By: Dereck Ligas M.D.   On: 06/03/2014 12:32    Other results: EKG: normal sinus rhythm, no significant ST-T wave abnormalities.  Assessment & Plan by Problem: Active Problems:   Chest pain  Patient is a 59 year old female with a history of  depression/anxiety and dyslipidemia who presents with several day history of chest pain of likely GI etiology, here for a chest pain rule out.   Chest pain: Left-sided pain radiating to the jaw concerning for ACS. Patient's risk factors include tobacco use and hyperlipidemia. She also presented with hyperglycemia with a glucose of 176 but no prior diagnosis of diabetes (prior A1c from May 2015 of 5.8). Troponin has been negative 1 and EKG with no concerning changes. No history of coronary artery disease with stents, patient reports that she has had a catheterization many years ago, no records in the system. Patient is hemodynamically stable. Chest x-ray unremarkable for any cardiopulmonary disease. Cardiology consulted and favoring GI process. Likely gastroenteritis or GERD. -Continue trending troponins -Nitroglycerin paste -Aspirin 325 mg -Consider treadmill test as an outpatient with cardiology should further workup be negative -GI cocktail  Nausea, vomiting, diarrhea: Could be secondary to gastroenteritis versus GERD. Patient also reporting some associated diarrhea. She denies any sick contacts at home but only lives at home with dogs. Patient reporting some hematemesis but likely secondary to Mallory-Weiss. Hemoglobin within normal limits. -1 L bolus of normal saline followed by 100 mL/h  Hyperglycemia: Admission glucose of 176. Prior hemoglobin A1c of 5.8 from May 2015. -Hemoglobin A1c pending  Depression/anxiety: Patient is on Xanax 0.5 mg every 6 hours when necessary, sertraline 50 mg daily and Klonopin 1 mg daily at home. -Continue home medications  Hyperlipidemia: Patient is on Zetia 10 mg daily and fish oil 1000 mg daily. Patient's last lipid panel from 7 months ago with a cholesterol of 229 and an LDL of 147, though patient has myalgias to statins. -Hold home medications given nausea vomiting.  Diet: Clears Prophylaxis: lovenox 40 mg Code: Full  Dispo: Disposition is deferred at  this time, awaiting improvement of current medical problems. Anticipated discharge in approximately 1 day(s).   The patient does have a current PCP (  Darlyne Russian, MD) and does need an [facility] hospital follow-up appointment after discharge.  The patient does not have transportation limitations that hinder transportation to clinic appointments.  Signed: Luan Moore, M.D., Ph.D. Internal Medicine Teaching Service, PGY-1 06/03/2014, 4:49 PM

## 2014-06-04 ENCOUNTER — Encounter (HOSPITAL_COMMUNITY): Payer: Self-pay | Admitting: General Practice

## 2014-06-04 DIAGNOSIS — E785 Hyperlipidemia, unspecified: Secondary | ICD-10-CM | POA: Diagnosis not present

## 2014-06-04 DIAGNOSIS — R079 Chest pain, unspecified: Secondary | ICD-10-CM | POA: Diagnosis not present

## 2014-06-04 DIAGNOSIS — R197 Diarrhea, unspecified: Secondary | ICD-10-CM | POA: Diagnosis not present

## 2014-06-04 DIAGNOSIS — K219 Gastro-esophageal reflux disease without esophagitis: Secondary | ICD-10-CM | POA: Diagnosis not present

## 2014-06-04 DIAGNOSIS — R110 Nausea: Secondary | ICD-10-CM | POA: Diagnosis not present

## 2014-06-04 DIAGNOSIS — F419 Anxiety disorder, unspecified: Secondary | ICD-10-CM | POA: Diagnosis not present

## 2014-06-04 DIAGNOSIS — R0602 Shortness of breath: Secondary | ICD-10-CM | POA: Diagnosis not present

## 2014-06-04 DIAGNOSIS — F329 Major depressive disorder, single episode, unspecified: Secondary | ICD-10-CM | POA: Diagnosis not present

## 2014-06-04 LAB — CBC WITH DIFFERENTIAL/PLATELET
BASOS ABS: 0 10*3/uL (ref 0.0–0.1)
BASOS PCT: 0 % (ref 0–1)
EOS PCT: 2 % (ref 0–5)
Eosinophils Absolute: 0.1 10*3/uL (ref 0.0–0.7)
HCT: 34.5 % — ABNORMAL LOW (ref 36.0–46.0)
Hemoglobin: 11.8 g/dL — ABNORMAL LOW (ref 12.0–15.0)
Lymphocytes Relative: 34 % (ref 12–46)
Lymphs Abs: 2 10*3/uL (ref 0.7–4.0)
MCH: 33.7 pg (ref 26.0–34.0)
MCHC: 34.2 g/dL (ref 30.0–36.0)
MCV: 98.6 fL (ref 78.0–100.0)
Monocytes Absolute: 0.5 10*3/uL (ref 0.1–1.0)
Monocytes Relative: 9 % (ref 3–12)
NEUTROS PCT: 55 % (ref 43–77)
Neutro Abs: 3.3 10*3/uL (ref 1.7–7.7)
PLATELETS: 183 10*3/uL (ref 150–400)
RBC: 3.5 MIL/uL — ABNORMAL LOW (ref 3.87–5.11)
RDW: 12.5 % (ref 11.5–15.5)
WBC: 6 10*3/uL (ref 4.0–10.5)

## 2014-06-04 LAB — GLUCOSE, CAPILLARY: Glucose-Capillary: 89 mg/dL (ref 70–99)

## 2014-06-04 LAB — TROPONIN I: Troponin I: <0.03 ng/mL (ref <0.031)

## 2014-06-04 LAB — HEMOGLOBIN A1C
Hgb A1c MFr Bld: 5.3 % (ref <5.7)
MEAN PLASMA GLUCOSE: 105 mg/dL (ref <117)

## 2014-06-04 MED ORDER — PNEUMOCOCCAL VAC POLYVALENT 25 MCG/0.5ML IJ INJ
0.5000 mL | INJECTION | INTRAMUSCULAR | Status: DC
  Filled 2014-06-04: qty 0.5

## 2014-06-04 MED ORDER — PANTOPRAZOLE SODIUM 20 MG PO TBEC
20.0000 mg | DELAYED_RELEASE_TABLET | Freq: Every day | ORAL | Status: DC
  Administered 2014-06-04 – 2014-06-05 (×2): 20 mg via ORAL
  Filled 2014-06-04 (×2): qty 1

## 2014-06-04 NOTE — Evaluation (Signed)
Physical Therapy Evaluation Patient Details Name: Beth Rice MRN: [id number] DOB: 1955-07-30 Today's Date: 06/04/2014   History of Present Illness  Patient is a 59 year old female with a history of depression/anxiety and dyslipidemia who presents with several day history of chest pain. .  Clinical Impression  Pt admitted with above. Pt indep and working PTA but now requires min/modA for safe transfers and ambulation. Pt lives alone and is unsafe to return home alone in this condition as pt is at increased falls risk and requires both physical assist and RW for ambulation at this time. Mobility limited by 7/10 chest/abdominal and LE pain and significant deconditioning. Recommend OT eval to assess ADL ability but suspect pt will require assist. If pt can not reach safe mod I level of function during hospital stay pt will need SNF.    Follow Up Recommendations SNF;Supervision/Assistance - 24 hour (if pt can't achieve safe mod I function by d/c)    Equipment Recommendations  Rolling walker with 5" wheels    Recommendations for Other Services OT consult     Precautions / Restrictions Precautions Precautions: Fall Restrictions Weight Bearing Restrictions: No      Mobility  Bed Mobility Overal bed mobility: Needs Assistance Bed Mobility: Sidelying to Sit     Supine to sit: Min assist     General bed mobility comments: hob elevated, use of bed rail, significant increase in time  Transfers Overall transfer level: Needs assistance Equipment used: Rolling walker (2 wheeled) Transfers: Sit to/from Stand Sit to Stand: Mod assist         General transfer comment: assist to achieve full upright posture, increased time with labored effort  Ambulation/Gait Ambulation/Gait assistance: Mod assist (2nd person for chair follow) Ambulation Distance (Feet): 10 Feet Assistive device: Rolling walker (2 wheeled) Gait Pattern/deviations: Step-to pattern;Decreased step length -  right;Decreased step length - left;Shuffle;Antalgic Gait velocity: slow   General Gait Details: pt with tremor like LE mvmt, significant UE Wbing, increased pain. pt requires RW for safe ambulation and demo's significant instability due to pain and tremors  Stairs            Wheelchair Mobility    Modified Rankin (Stroke Patients Only)       Balance Overall balance assessment: Needs assistance Sitting-balance support: Feet supported;No upper extremity supported Sitting balance-Leahy Scale: Good     Standing balance support: Bilateral upper extremity supported Standing balance-Leahy Scale: Poor Standing balance comment: requires UE assist to maintain upright position                             Pertinent Vitals/Pain Pain Assessment: 0-10 Pain Score: 5  Pain Location: chest and abdomen radiating down LEs, increased to 7/10 with ambulation Pain Descriptors / Indicators: Constant;Burning Pain Intervention(s): Monitored during session;Limited activity within patient's tolerance    Home Living Family/patient expects to be discharged to:: Private residence Living Arrangements: Alone Available Help at Discharge:  (none) Type of Home: House Home Access: Stairs to enter Entrance Stairs-Rails: None Entrance Stairs-Number of Steps: 3 Home Layout: Able to live on main level with bedroom/bathroom;Two level Home Equipment: None      Prior Function Level of Independence: Independent         Comments: pt works full time     Journalist, newspaper   Dominant Hand: Right    Extremity/Trunk Assessment   Upper Extremity Assessment: Generalized weakness  Lower Extremity Assessment: Generalized weakness (pt unable to tolerate MMT due to onset of back pain)      Cervical / Trunk Assessment: Normal  Communication   Communication: No difficulties  Cognition Arousal/Alertness: Awake/alert Behavior During Therapy: WFL for tasks  assessed/performed Overall Cognitive Status: Within Functional Limits for tasks assessed                      General Comments General comments (skin integrity, edema, etc.): pt extremely frustrated with current mobility capability and desires to know what is going on with her    Exercises        Assessment/Plan    PT Assessment Patient needs continued PT services  PT Diagnosis Difficulty walking;Generalized weakness;Acute pain   PT Problem List Decreased strength;Decreased range of motion;Decreased activity tolerance;Decreased mobility;Decreased balance  PT Treatment Interventions DME instruction;Gait training;Stair training;Functional mobility training;Therapeutic activities;Therapeutic exercise   PT Goals (Current goals can be found in the Care Plan section) Acute Rehab PT Goals Patient Stated Goal: home PT Goal Formulation: With patient Time For Goal Achievement: 06/11/14 Potential to Achieve Goals: Good    Frequency Min 3X/week   Barriers to discharge Decreased caregiver support pt lives alone and currently requires min/modA for safe function    Co-evaluation               End of Session Equipment Utilized During Treatment: Gait belt Activity Tolerance: Patient limited by pain;Patient limited by fatigue Patient left: in chair;with call bell/phone within reach;with family/visitor present Nurse Communication: Mobility status    Functional Assessment Tool Used: clinical judgement Functional Limitation: Mobility: Walking and moving around Mobility: Walking and Moving Around Current Status (Q9169): At least 40 percent but less than 60 percent impaired, limited or restricted Mobility: Walking and Moving Around Goal Status [phone]): At least 1 percent but less than 20 percent impaired, limited or restricted    Time: 8828-0034 PT Time Calculation (min) (ACUTE ONLY): 19 min   Charges:   PT Evaluation $Initial PT Evaluation Tier I: 1 Procedure PT  Treatments $Gait Training: 8-22 mins   PT G Codes:   PT G-Codes **NOT FOR INPATIENT CLASS** Functional Assessment Tool Used: clinical judgement Functional Limitation: Mobility: Walking and moving around Mobility: Walking and Moving Around Current Status (J1791): At least 40 percent but less than 60 percent impaired, limited or restricted Mobility: Walking and Moving Around Goal Status [phone]): At least 1 percent but less than 20 percent impaired, limited or restricted    Kingsley Callander 06/04/2014, 12:35 PM   Kittie Plater, PT, DPT Pager #: [phone] Office #: [phone]

## 2014-06-04 NOTE — Progress Notes (Signed)
Subjective:  Patient states that she was able to only minimally at breakfast. Patient also reporting that she is diffusely weak and out of it. Otherwise, patient states that her chest pain is mostly now a burning in her epigastrium. Patient states that the GI cocktail has helped in alleviating her symptoms.  Objective: Vital signs in last 24 hours: Filed Vitals:   06/03/14 1500 06/03/14 1600 06/03/14 1938 06/04/14 0544  BP: 99/63 98/65 99/59  110/65  Pulse: 85 76  75  Temp:   98.6 F (37 C) 98.4 F (36.9 C)  TempSrc:   Oral Oral  Resp: 15 20 18 18   Height:      Weight:    56.926 kg (125 lb 8 oz)  SpO2: 95%  97% 92%   Weight change:   Intake/Output Summary (Last 24 hours) at 06/04/14 0642 Last data filed at 06/04/14 0554  Gross per 24 hour  Intake 1278.33 ml  Output    800 ml  Net 478.33 ml   General: resting in bed HEENT: PERRL, EOMI, no scleral icterus Cardiac: RRR, no rubs, murmurs or gallops, no chest wall rashes, no tenderness to palpation Pulm: clear to auscultation bilaterally, moving normal volumes of air Abd: soft, mild tenderness to palpation in the epigastrium, nondistended, BS present Ext: warm and well perfused, no pedal edema Neuro: alert and oriented X3, cranial nerves II-XII grossly intact Skin: no rashes or lesions noted Psych: appropriate affect  Lab Results: Basic Metabolic Panel:  Recent Labs Lab 06/03/14 1203  NA 138  K 3.6  CL 103  CO2 28  GLUCOSE 176*  BUN 7  CREATININE 0.64  CALCIUM 9.2   Liver Function Tests: No results for input(s): AST, ALT, ALKPHOS, BILITOT, PROT, ALBUMIN in the last 168 hours. No results for input(s): LIPASE, AMYLASE in the last 168 hours. No results for input(s): AMMONIA in the last 168 hours. CBC:  Recent Labs Lab 06/03/14 1203 06/04/14 0500  WBC 5.4 6.0  NEUTROABS  --  3.3  HGB 13.7 11.8*  HCT 39.4 34.5*  MCV 94.9 98.6  PLT 211 183   Cardiac Enzymes:  Recent Labs Lab 06/03/14 1735  06/03/14 2301 06/04/14 0500  TROPONINI <0.03 <0.03 <0.03   BNP: No results for input(s): PROBNP in the last 168 hours. D-Dimer: No results for input(s): DDIMER in the last 168 hours. CBG:  Recent Labs Lab 06/03/14 1955  GLUCAP 96   Hemoglobin A1C: No results for input(s): HGBA1C in the last 168 hours. Fasting Lipid Panel: No results for input(s): CHOL, HDL, LDLCALC, TRIG, CHOLHDL, LDLDIRECT in the last 168 hours. Thyroid Function Tests: No results for input(s): TSH, T4TOTAL, FREET4, T3FREE, THYROIDAB in the last 168 hours. Coagulation: No results for input(s): LABPROT, INR in the last 168 hours. Anemia Panel: No results for input(s): VITAMINB12, FOLATE, FERRITIN, TIBC, IRON, RETICCTPCT in the last 168 hours. Urine Drug Screen: Drugs of Abuse  No results found for: LABOPIA, COCAINSCRNUR, LABBENZ, AMPHETMU, THCU, LABBARB  Alcohol Level: No results for input(s): ETH in the last 168 hours. Urinalysis: No results for input(s): COLORURINE, LABSPEC, PHURINE, GLUCOSEU, HGBUR, BILIRUBINUR, KETONESUR, PROTEINUR, UROBILINOGEN, NITRITE, LEUKOCYTESUR in the last 168 hours.  Invalid input(s): APPERANCEUR Micro Results: No results found for this or any previous visit (from the past 240 hour(s)). Studies/Results: Dg Chest 2 View  06/03/2014   CLINICAL DATA:  LEFT-sided chest pain for 3 days. Chest pain radiating into the LEFT neck.  EXAM: CHEST  2 VIEW  COMPARISON:  None.  FINDINGS:  The heart size and mediastinal contours are within normal limits. Both lungs are clear. The visualized skeletal structures are unremarkable.  IMPRESSION: No active cardiopulmonary disease.   Electronically Signed   By: Dereck Ligas M.D.   On: 06/03/2014 12:32   Medications: I have reviewed the patient's current medications. Scheduled Meds: . aspirin  81 mg Oral Daily  . enoxaparin (LOVENOX) injection  40 mg Subcutaneous Q24H  . sertraline  50 mg Oral Daily   Continuous Infusions: . sodium chloride 100  mL/hr at 06/04/14 0314   PRN Meds:.acetaminophen, ALPRAZolam, clonazePAM, gi cocktail, morphine injection, nitroGLYCERIN, ondansetron (ZOFRAN) IV Assessment/Plan: Active Problems:   Chest pain  Patient is a 59 year old female with a history of depression/anxiety and dyslipidemia who presents with several day history of chest pain of likely GI etiology.  GERD: Patient initially presenting with a left-sided chest pain that radiated to her left jaw somewhat concerning for ACS. Patient's risk factors include current tobacco use and hyperlipidemia. Hemoglobin A1c is 5.3. Troponins have been negative 3 and EKGs with no concerning changes. No history of coronary artery disease or stents. Patient's initial diarrhea has now resolved. Cardiology favoring GI process. Patient's current presentation is likely secondary to GERD as her symptoms have been alleviated with the GI cocktail and Protonix. Patient was initially ready for discharge today however patient has felt extraordinarily weak and has not been able to tolerate by mouth intake secondary to nausea. We'll continue to observe patient overnight for stabilization of symptoms. Orthostatic vitals negative. -Aspirin 81 mg -Continue with GI cocktail -Protonix 20 mg daily -May consider outpatient stress test should symptoms not improve.  Depression/anxiety: Patient is on Xanax 0.5 mg every 6 hours when necessary, sertraline 50 mg daily and Klonopin 1 mg daily at home. -Continue home medications  Hyperlipidemia: Patient is on Zetia 10 mg daily and fish oil 1000 mg daily. Patient's last lipid panel from 7 months ago with a cholesterol of 229 and an LDL of 147, though patient has myalgias to statins. -Hold home medications given nausea vomiting.  Diet: Heart Prophylaxis: lovenox 40 mg Code: Full  Dispo: Disposition is deferred at this time, awaiting improvement of current medical problems. Anticipated discharge in approximately 1 day(s).   The patient  does have a current PCP Darlyne Russian, MD) and does need an [facility] hospital follow-up appointment after discharge.  The patient does not have transportation limitations that hinder transportation to clinic appointments.  .Services Needed at time of discharge: Y = Yes, Blank = No PT:   OT:   RN:   Equipment:   Other:     LOS: 1 day   Luan Moore, MD 06/04/2014, 6:42 AM

## 2014-06-04 NOTE — Progress Notes (Signed)
UR completed 

## 2014-06-04 NOTE — Progress Notes (Addendum)
Clinical Social Work Department BRIEF PSYCHOSOCIAL ASSESSMENT 06/04/2014  Patient:  Beth Rice, Beth Rice     Account Number:  [id number]     [place] date:  06/03/2014  Clinical Social Worker:  Adair Laundry  Date/Time:  06/04/2014 01:08 PM  Referred by:  Physician  Date Referred:  06/04/2014 Referred for  SNF Placement   Other Referral:   Interview type:  Patient Other interview type:    PSYCHOSOCIAL DATA Living Status:  ALONE Admitted from facility:   Level of care:   Primary support name:  [name] Primary support relationship to patient:  SIBLING Degree of support available:   Pt has good support from family and friends    CURRENT CONCERNS Current Concerns  Post-Acute Placement   Other Concerns:    SOCIAL WORK ASSESSMENT / PLAN CSW (Clinical Education officer, museum) visited pt room and spoke with her about PT recommendation. Pt informed CSW she would not want to dc to SNF. Pt expressed that she has multiple dogs and needs to go home to care to them. After further discussion, pt also expressed that she doesn't think she needs SNF. CSW asked if pt would have someone that can provide supervision/assistance. Pt informed CSW her friend/secretary will spend the nights with her and make sure she has everything she needs in the morning. After that, pt will have friends/family dropping in through out the day to assist. Pt is open to home health services. Pt has no concerns for her safety at home. CSW notified RNCM of conversation. CSW signing off.   Assessment/plan status:  No Further Intervention Required Other assessment/ plan:   Information/referral to community resources:   SNF list denied    PATIENT'S/FAMILY'S RESPONSE TO PLAN OF CARE: Pt is not agreeable to SNF at this time. She would prefer to dc home.     [place], [place]

## 2014-06-04 NOTE — Evaluation (Signed)
Occupational Therapy Evaluation Patient Details Name: Beth Rice MRN: [id number] DOB: 02/13/1956 Today's Date: 06/04/2014    History of Present Illness Patient is a 59 year old female with a history of depression/anxiety and dyslipidemia who presents with several day history of chest pain. .   Clinical Impression   Pt admitted with above. Pt independent with ADLs, PTA. Feel pt will benefit from acute OT to increase independence with BADLs and increase strength, prior to d/c. Recommending SNF for d/c as pt lives alone. Discussed this with pt and it seems as she is considering it as an option. Pt became tearful in session.    Follow Up Recommendations  SNF;Supervision/Assistance - 24 hour    Equipment Recommendations  Other (comment) (AE)    Recommendations for Other Services       Precautions / Restrictions Precautions Precautions: Fall Restrictions Weight Bearing Restrictions: No      Mobility Bed Mobility     General bed mobility comments: not assessed  Transfers Overall transfer level: Needs assistance Equipment used: Rolling walker (2 wheeled) Transfers: Sit to/from Stand Sit to Stand: Mod assist         General transfer comment: cues for technique. assist to boost and control descent.         ADL Overall ADL's : Needs assistance/impaired Eating/Feeding: Set up;Sitting   Grooming: Wash/dry face;Oral care;Brushing hair;Minimal assistance;Standing Grooming Details (indicate cue type and reason): for balance Upper Body Bathing: Set up;Sitting   Lower Body Bathing: Minimal assistance;Sit to/from stand   Upper Body Dressing : Set up;Sitting   Lower Body Dressing: Minimal assistance;Sit to/from stand   Toilet Transfer: Moderate assistance;Ambulation;RW (chair)   Toileting- Clothing Manipulation and Hygiene: Minimal assistance;Sit to/from stand       Functional mobility during ADLs: Moderate assistance;Rolling walker General ADL Comments: Educated  on tub transfer techniques and options for shower chair. Educated on multiple uses of 3 in 1. Educated on safety tips such as use of bag on walker, sitting for LB ADLs and could lean to get them up over buttocks, pets. If pt does go home, recommended someone be with her. Discussed possibly going to snf for rehab and how home health is not therapy everyday. Educated on AE/cost/where to purchase as pt able to access Right sock, but hurts her back.     Vision  Pt wears glasses all the time.                   Perception     Praxis      Pertinent Vitals/Pain Pain Assessment: 0-10 Pain Score: 4  Pain Location: legs and stomach Pain Descriptors / Indicators: Tingling (in legs) Pain Intervention(s): Monitored during session;Repositioned     Hand Dominance Right   Extremity/Trunk Assessment Upper Extremity Assessment Upper Extremity Assessment: Generalized weakness   Lower Extremity Assessment Lower Extremity Assessment: Defer to PT evaluation   Cervical / Trunk Assessment Cervical / Trunk Assessment: Normal   Communication Communication Communication: No difficulties   Cognition Arousal/Alertness: Awake/alert Behavior During Therapy: WFL for tasks assessed/performed Overall Cognitive Status: Within Functional Limits for tasks assessed                     General Comments       Exercises       Shoulder Instructions      Home Living Family/patient expects to be discharged to:: Private residence Living Arrangements: Alone Available Help at Discharge: Other (Comment) (daughter states they can have someone stay with  pt) Type of Home: House Home Access: Stairs to enter [employer] of Steps: 3 Entrance Stairs-Rails: None Home Layout: Able to live on main level with bedroom/bathroom;Two level     Bathroom Shower/Tub: Tub/shower unit;Walk-in shower (tub/shower on main level)   Biochemist, clinical: Standard     Home Equipment: None (daughter has  walker and BSC pt can use)          Prior Functioning/Environment Level of Independence: Independent        Comments: pt works full time    OT Diagnosis: Generalized weakness;Acute pain   OT Problem List: Decreased strength;Impaired balance (sitting and/or standing);Decreased activity tolerance;Decreased knowledge of use of DME or AE;Decreased knowledge of precautions;Pain;Impaired sensation   OT Treatment/Interventions: Self-care/ADL training;DME and/or AE instruction;Therapeutic exercise;Therapeutic activities;Patient/family education;Balance training    OT Goals(Current goals can be found in the care plan section) Acute Rehab OT Goals Patient Stated Goal: home OT Goal Formulation: With patient Time For Goal Achievement: 06/11/14 Potential to Achieve Goals: Good ADL Goals Pt Will Perform Grooming: with set-up;with supervision;standing Pt Will Perform Lower Body Bathing: with set-up;with supervision;sit to/from stand Pt Will Perform Lower Body Dressing: with set-up;with supervision;sit to/from stand Pt Will Transfer to Toilet: ambulating;with supervision;bedside commode Pt Will Perform Toileting - Clothing Manipulation and hygiene: with supervision;sit to/from stand  OT Frequency: Min 2X/week   Barriers to D/C:            Co-evaluation              End of Session Equipment Utilized During Treatment: Gait belt;Rolling walker  Activity Tolerance: Patient limited by pain Patient left: in chair;with call bell/phone within reach;with family/visitor present   Time: 9381-8299 OT Time Calculation (min): 27 min Charges:  OT General Charges $OT Visit: 1 Procedure OT Evaluation $Initial OT Evaluation Tier I: 1 Procedure OT Treatments $Self Care/Home Management : 8-22 mins G-Codes: OT G-codes **NOT FOR INPATIENT CLASS** Functional Assessment Tool Used: clinical judgment Functional Limitation: Self care Self Care Current Status (B7169): At least 20 percent but less  than 40 percent impaired, limited or restricted Self Care Goal Status (C7893): At least 1 percent but less than 20 percent impaired, limited or restricted  Benito Mccreedy OTR/L [phone] 06/04/2014, 2:12 PM

## 2014-06-05 ENCOUNTER — Observation Stay (HOSPITAL_COMMUNITY): Payer: BC Managed Care – PPO

## 2014-06-05 DIAGNOSIS — K219 Gastro-esophageal reflux disease without esophagitis: Secondary | ICD-10-CM | POA: Diagnosis not present

## 2014-06-05 DIAGNOSIS — F329 Major depressive disorder, single episode, unspecified: Secondary | ICD-10-CM | POA: Diagnosis not present

## 2014-06-05 DIAGNOSIS — F419 Anxiety disorder, unspecified: Secondary | ICD-10-CM | POA: Diagnosis not present

## 2014-06-05 DIAGNOSIS — E785 Hyperlipidemia, unspecified: Secondary | ICD-10-CM | POA: Diagnosis not present

## 2014-06-05 LAB — CBC
HCT: 33.7 % — ABNORMAL LOW (ref 36.0–46.0)
HEMOGLOBIN: 11.5 g/dL — AB (ref 12.0–15.0)
MCH: 33.3 pg (ref 26.0–34.0)
MCHC: 34.1 g/dL (ref 30.0–36.0)
MCV: 97.7 fL (ref 78.0–100.0)
Platelets: 185 10*3/uL (ref 150–400)
RBC: 3.45 MIL/uL — ABNORMAL LOW (ref 3.87–5.11)
RDW: 12.5 % (ref 11.5–15.5)
WBC: 6.3 10*3/uL (ref 4.0–10.5)

## 2014-06-05 MED ORDER — ONDANSETRON HCL 4 MG PO TABS: 4.0000 mg | ORAL_TABLET | Freq: Three times a day (TID) | ORAL | Status: DC | PRN

## 2014-06-05 MED ORDER — PANTOPRAZOLE SODIUM 20 MG PO TBEC: 20.0000 mg | DELAYED_RELEASE_TABLET | Freq: Every day | ORAL | Status: DC

## 2014-06-05 MED ORDER — SUCRALFATE 1 GM/10ML PO SUSP: 1.0000 g | Freq: Three times a day (TID) | ORAL | Status: DC

## 2014-06-05 NOTE — Progress Notes (Signed)
Physical Therapy Treatment Patient Details Name: Beth Rice MRN: [id number] DOB: July 29, 1955 Today's Date: 06/05/2014    History of Present Illness Patient is a 59 year old female with a history of depression/anxiety and dyslipidemia who presents with several day history of chest pain. .    PT Comments    Pt able to safely negotiate 3 stairs using RW and cuing to teach new technique. Pt felt comfortable being able to perform stairs with RW and assistance from friend once home. Pt able to ambulate to stairwell and back using RW without loss of balance. Pt stated she feels comfortable from a mobility stand point going home and did not want to practice any further mobility at this time.    Follow Up Recommendations  Home health PT;Supervision/Assistance - 24 hour     Equipment Recommendations  Rolling walker with 5" wheels    Recommendations for Other Services       Precautions / Restrictions Precautions Precautions: Fall Restrictions Weight Bearing Restrictions: No    Mobility  Bed Mobility Overal bed mobility: Modified Independent             General bed mobility comments: Pt sitting up at start of session; did not assess  Transfers Overall transfer level: Needs assistance Equipment used: Rolling walker (2 wheeled) Transfers: Sit to/from Stand Sit to Stand: Supervision         General transfer comment: Pt able to safely stand from lowered bed without assistance or verbal cues. Pt with correct hand placement for transition.   Ambulation/Gait Ambulation/Gait assistance: Supervision Ambulation Distance (Feet): 120 Feet Assistive device: Rolling walker (2 wheeled) Gait Pattern/deviations: Step-through pattern;Decreased step length - right;Decreased step length - left;Decreased stride length Gait velocity: Slow Gait velocity interpretation: Below normal speed for age/gender General Gait Details: Pt ambulates with very slow gait, and has difficulty lifting Right  foot. PT explained importance of getting rid of objects such as throw rugs at home which can be tripping hazards. Pt had no loss of balance during session.     Stairs Stairs: Yes Stairs assistance: Min guard Stair Management: Step to pattern;Backwards;With walker Number of Stairs: 3 General stair comments: Pt able to safely negotiate 3 stairs with cuing from PT for placement of RW and which limb to lead with each way. Pt with no loss of balance and stated she felt comfortable with this technique when she goes home. Pt stated she felt comfortable being able to explain the technique to her care giver who will be assisting her into the house and did not require written instructions.   Wheelchair Mobility    Modified Rankin (Stroke Patients Only)       Balance Overall balance assessment: Needs assistance Sitting-balance support: No upper extremity supported;Feet supported Sitting balance-Leahy Scale: Good     Standing balance support: During functional activity;Bilateral upper extremity supported Standing balance-Leahy Scale: Fair Standing balance comment: Pt requiring RW for support during dynamic standing.                     Cognition Arousal/Alertness: Awake/alert Behavior During Therapy: WFL for tasks assessed/performed Overall Cognitive Status: Within Functional Limits for tasks assessed                      Exercises      General Comments        Pertinent Vitals/Pain Pain Assessment: No/denies pain    Home Living  Prior Function            PT Goals (current goals can now be found in the care plan section) Acute Rehab PT Goals Patient Stated Goal: home today Progress towards PT goals: Progressing toward goals    Frequency  Min 3X/week    PT Plan Discharge plan needs to be updated    Co-evaluation             End of Session Equipment Utilized During Treatment: Gait belt Activity Tolerance: Patient  tolerated treatment well Patient left: in bed;with call bell/phone within reach;with family/visitor present     Time: 0814-4818 PT Time Calculation (min) (ACUTE ONLY): 16 min  Charges:  $Gait Training: 8-22 mins                    G CodesJearld Shines SPT 06/05/2014, 11:25 AM  Jearld Shines, SPT  Acute Rehabilitation [phone]

## 2014-06-05 NOTE — Progress Notes (Signed)
UR completed 

## 2014-06-05 NOTE — Care Management Note (Signed)
    Page 1 of 1   06/05/2014     11:01:19 AM CARE MANAGEMENT NOTE 06/05/2014  Patient:  Beth Rice, Beth Rice   Account Number:  [id number]  Date Initiated:  06/05/2014  Documentation initiated by:  GRAVES-BIGELOW,Kenesha Moshier  Subjective/Objective Assessment:   Pt admitted for CP. Plan for d/c home with [facility] services. Pt agreeable to services.     Action/Plan:   Pt chose [facility] for services and SOC to begin within 24-48 hrs post d/c. CM provided pt Personal Care List for [employer]. No further needs at this time.   Anticipated DC Date:  06/05/2014   Anticipated DC Plan:  [place]  CM consult      Choice offered to / List presented to:  C-1 Patient        [place] arranged  [place].   Status of service:  Completed, signed off Medicare Important Message given?  NO (If response is "NO", the following Medicare IM given date fields will be blank) Date Medicare IM given:   Medicare IM given by:   Date Additional Medicare IM given:   Additional Medicare IM given by:    Discharge Disposition:  [place]  Per UR Regulation:  Reviewed for med. necessity/level of care/duration of stay  If discussed at [place] of Stay Meetings, dates discussed:    Comments:

## 2014-06-05 NOTE — Progress Notes (Signed)
Subjective:  Patient states that she is much improved compared to yesterday, especially now being able to ambulate well with a walker. Patient does report some coughing overnight with some scant blood in her sputum. Additionally, she has been able to tolerate food and was able to eat 2 turkeys and which is yesterday. Patient has not had any recurrent issues with severe epigastric burning or vomiting. Patient states that she has a very strong preference to go home as opposed to a nursing facility.  Objective: Vital signs in last 24 hours: Filed Vitals:   06/04/14 1023 06/04/14 2017 06/04/14 2316 06/05/14 0502  BP:  115/73 104/66 109/72  Pulse:  71 75 76  Temp: 99.4 F (37.4 C) 98.6 F (37 C) 98.2 F (36.8 C) 98.4 F (36.9 C)  TempSrc: Oral Oral Oral Oral  Resp: 18 18  18   Height:      Weight:    56.926 kg (125 lb 8 oz)  SpO2: 96% 98% 97% 98%   Weight change: 2.495 kg (5 lb 8 oz)  Intake/Output Summary (Last 24 hours) at 06/05/14 0636 Last data filed at 06/05/14 0500  Gross per 24 hour  Intake    820 ml  Output   1650 ml  Net   -830 ml   General: resting in bed HEENT: PERRL, EOMI, no scleral icterus Cardiac: RRR, no rubs, murmurs or gallops, no chest wall rashes, no tenderness to palpation Pulm: clear to auscultation bilaterally, moving normal volumes of air Abd: soft, mild tenderness to palpation in the epigastrium, nondistended, BS present Ext: warm and well perfused, no pedal edema Neuro: alert and oriented X3, cranial nerves II-XII grossly intact Skin: no rashes or lesions noted Psych: appropriate affect  Lab Results: Basic Metabolic Panel:  Recent Labs Lab 06/03/14 1203  NA 138  K 3.6  CL 103  CO2 28  GLUCOSE 176*  BUN 7  CREATININE 0.64  CALCIUM 9.2   Liver Function Tests: No results for input(s): AST, ALT, ALKPHOS, BILITOT, PROT, ALBUMIN in the last 168 hours. No results for input(s): LIPASE, AMYLASE in the last 168 hours. No results for input(s):  AMMONIA in the last 168 hours. CBC:  Recent Labs Lab 06/04/14 0500 06/05/14 0029  WBC 6.0 6.3  NEUTROABS 3.3  --   HGB 11.8* 11.5*  HCT 34.5* 33.7*  MCV 98.6 97.7  PLT 183 185   Cardiac Enzymes:  Recent Labs Lab 06/03/14 1735 06/03/14 2301 06/04/14 0500  TROPONINI <0.03 <0.03 <0.03   BNP: No results for input(s): PROBNP in the last 168 hours. D-Dimer: No results for input(s): DDIMER in the last 168 hours. CBG:  Recent Labs Lab 06/03/14 1955 06/04/14 2305  GLUCAP 96 89   Hemoglobin A1C:  Recent Labs Lab 06/04/14 0500  HGBA1C 5.3   Fasting Lipid Panel: No results for input(s): CHOL, HDL, LDLCALC, TRIG, CHOLHDL, LDLDIRECT in the last 168 hours. Thyroid Function Tests: No results for input(s): TSH, T4TOTAL, FREET4, T3FREE, THYROIDAB in the last 168 hours. Coagulation: No results for input(s): LABPROT, INR in the last 168 hours. Anemia Panel: No results for input(s): VITAMINB12, FOLATE, FERRITIN, TIBC, IRON, RETICCTPCT in the last 168 hours. Urine Drug Screen: Drugs of Abuse  No results found for: LABOPIA, COCAINSCRNUR, LABBENZ, AMPHETMU, THCU, LABBARB  Alcohol Level: No results for input(s): ETH in the last 168 hours. Urinalysis: No results for input(s): COLORURINE, LABSPEC, PHURINE, GLUCOSEU, HGBUR, BILIRUBINUR, KETONESUR, PROTEINUR, UROBILINOGEN, NITRITE, LEUKOCYTESUR in the last 168 hours.  Invalid input(s): APPERANCEUR Micro  Results: No results found for this or any previous visit (from the past 240 hour(s)). Studies/Results: Dg Chest 2 View  06/03/2014   CLINICAL DATA:  LEFT-sided chest pain for 3 days. Chest pain radiating into the LEFT neck.  EXAM: CHEST  2 VIEW  COMPARISON:  None.  FINDINGS: The heart size and mediastinal contours are within normal limits. Both lungs are clear. The visualized skeletal structures are unremarkable.  IMPRESSION: No active cardiopulmonary disease.   Electronically Signed   By: Dereck Ligas M.D.   On: 06/03/2014 12:32     Medications: I have reviewed the patient's current medications. Scheduled Meds: . aspirin  81 mg Oral Daily  . enoxaparin (LOVENOX) injection  40 mg Subcutaneous Q24H  . pantoprazole  20 mg Oral Daily  . pneumococcal 23 valent vaccine  0.5 mL Intramuscular Tomorrow-1000  . sertraline  50 mg Oral Daily   Continuous Infusions:   PRN Meds:.acetaminophen, ALPRAZolam, clonazePAM, gi cocktail, nitroGLYCERIN, ondansetron (ZOFRAN) IV Assessment/Plan: Active Problems:   Chest pain  Patient is a 59 year old female with a history of depression/anxiety and dyslipidemia who presents with several day history of chest pain of likely GI etiology.  GERD: Patient initially presented with some symptoms concerning for ACS although her EKGs and troponins have been negative 3. The actual etiology of her discomfort is much more likely due to a gastroenteritis in addition to GERD. Patient has done well in response to a GI cocktail as well as Protonix. Patient states that her nausea and vomiting is much resolved over the interval.  -Continue aspirin 81 mg -Continue with GI cocktail -Continue with Protonix 20 mg daily -May consider outpatient stress test should symptoms not improve. -Encourage smoking cessation.  Possible hemoptysis: Patient reports having some scant blood in her sputum overnight. This is likely secondary to her repeated episodes of vomiting. Hemoglobin has been stable at 11.5. Patient is otherwise hemodynamically stable. Chest x-ray normal. -Patient may warrant further GI workup as an outpatient should this persist.  Depression/anxiety: Patient is on Xanax 0.5 mg every 6 hours when necessary, sertraline 50 mg daily and Klonopin 1 mg daily at home. -Continue home medications  Hyperlipidemia: Patient is on Zetia 10 mg daily and fish oil 1000 mg daily. Patient's last lipid panel from 7 months ago with a cholesterol of 229 and an LDL of 147, though patient has myalgias to statins. -Will  continue home medications upon discharge.  Diet: Heart Prophylaxis: lovenox 40 mg Code: Full  Dispo: Disposition is deferred at this time, awaiting improvement of current medical problems. Anticipated discharge in approximately 0 day(s).   The patient does have a current PCP Darlyne Russian, MD) and does need an [facility] hospital follow-up appointment after discharge.  The patient does not have transportation limitations that hinder transportation to clinic appointments.  .Services Needed at time of discharge: Y = Yes, Blank = No PT:   OT:   RN:   Equipment:   Other:     LOS: 2 days   Luan Moore, MD 06/05/2014, 6:36 AM

## 2014-06-05 NOTE — Discharge Instructions (Signed)

## 2014-06-05 NOTE — Progress Notes (Signed)
Occupational Therapy Treatment Patient Details Name: Beth Rice MRN: [id number] DOB: 20-Apr-1956 Today's Date: 06/05/2014    History of present illness Patient is a 59 year old female with a history of depression/anxiety and dyslipidemia who presents with several day history of chest pain. .   OT comments  Pt seen today for ADL session. Pt with significant improvement in functional mobility this date and overall at Supervision level with ADLs. Feel that pt would benefit from 24/7 supervision due to weakness and family/friends can provide assist "most" of the time, however am recommended HHaide to ensure 24/7 supervision. Pt plans to d/c home today and has no further acute OT needs.    Follow Up Recommendations  Home health OT;Other (comment);Supervision/Assistance - 24 hour (HHaide)    Equipment Recommendations    None      Precautions / Restrictions Precautions Precautions: Fall Restrictions Weight Bearing Restrictions: No       Mobility Bed Mobility Overal bed mobility: Modified Independent                Transfers Overall transfer level: Needs assistance Equipment used: Rolling walker (2 wheeled) Transfers: Sit to/from Stand Sit to Stand: Supervision         General transfer comment: Supervision for safety. VC's for hand placement.     Balance Overall balance assessment: Needs assistance Sitting-balance support: No upper extremity supported;Feet supported Sitting balance-Leahy Scale: Good     Standing balance support: No upper extremity supported;During functional activity Standing balance-Leahy Scale: Fair Standing balance comment: Able to stand at sink without UE support. Requires UE support for dynamic balance.                    ADL Overall ADL's : Needs assistance/impaired     Grooming: Oral care;Supervision/safety;Set up;Standing               Lower Body Dressing: Set up;Supervision/safety;Sit to/from stand                Functional mobility during ADLs: Rolling walker;Supervision/safety General ADL Comments: Pt with significant improvement in functional mobility today and able to stand from bed and ambulate to sink for oral care. Supervision for balance during ambulation.                 Cognition  Arousal/Alertness: Awake/Alert Behavior During Therapy: WFL for tasks assessed/performed Overall Cognitive Status: Within Functional Limits for tasks assessed                                    Pertinent Vitals/ Pain       Pain Assessment: No/denies pain         Frequency       Progress Toward Goals  OT Goals(current goals can now be found in the care plan section)  Progress towards OT goals: Goals met/education completed, patient discharged from OT;Progressing toward goals    Plan Discharge plan needs to be updated;All goals met and education completed, patient discharged from OT services       End of Session Equipment Utilized During Treatment: Rolling walker   Activity Tolerance Patient tolerated treatment well   Patient Left with call bell/phone within reach;with family/visitor present;Other (comment) (sitting EOB)   Nurse Communication      Functional Assessment Tool Used: clinical judgment Functional Limitation: Self care Self Care Current Status [phone]): At least 1 percent but less than 20 percent impaired, limited  or restricted Self Care Goal Status [phone]): At least 1 percent but less than 20 percent impaired, limited or restricted Self Care Discharge Status [phone]): At least 1 percent but less than 20 percent impaired, limited or restricted   Time: 0951-1010 OT Time Calculation (min): 19 min  Charges: OT G-codes **NOT FOR INPATIENT CLASS** Functional Assessment Tool Used: clinical judgment Functional Limitation: Self care Self Care Current Status ([id number]): At least 1 percent but less than 20 percent impaired, limited or restricted Self Care Goal Status (V9166):  At least 1 percent but less than 20 percent impaired, limited or restricted Self Care Discharge Status [phone]): At least 1 percent but less than 20 percent impaired, limited or restricted OT General Charges $OT Visit: 1 Procedure OT Treatments $Self Care/Home Management : 8-22 mins  Juluis Rainier 06/05/2014, 10:15 AM  Cyndie Chime, OTR/L Occupational Therapist [phone] (pager)

## 2014-06-06 ENCOUNTER — Telehealth: Payer: Self-pay | Admitting: *Deleted

## 2014-06-06 NOTE — Telephone Encounter (Signed)
Printed letter and will bring down to 104 for you to sign.

## 2014-06-06 NOTE — Telephone Encounter (Signed)
Pt is needing to get an [employer] letter from Dr. Everlene Farrier to keep her out of work until her follow up with you.  Pt has a follow up appt on 1/19- she is still having a lot of weakness in her legs. She is using a walker currently to get around her home. [phone] Pt office fax number she would like it faxed to. She does not have the ability to come pick up.

## 2014-06-06 NOTE — Discharge Summary (Signed)
Name: Beth Rice MRN: [id number] DOB: Jul 19, 1955 59 y.o. PCP: Darlyne Russian, MD  Date of Admission: 06/03/2014 11:20 AM Date of Discharge: 06/05/2014 Attending Physician: Murriel Hopper  Discharge Diagnosis: Active Problems:   GERD  Discharge Medications:   Medication List    STOP taking these medications        azithromycin 250 MG tablet  Commonly known as:  ZITHROMAX Z-PAK      TAKE these medications        ALPRAZolam 0.5 MG tablet  Commonly known as:  XANAX  TAKE 1 TABLET BY MOUTH EVERY 6 HOURS AS NEEDED FOR MUSCLE SPASMS     aspirin EC 81 MG tablet  Take 81 mg by mouth daily.     b complex vitamins capsule  Take 1 capsule by mouth at bedtime.  Notes to Patient:  Tonight 06/05/14     CALCIUM + D PO  Take 1 tablet by mouth at bedtime.     clonazePAM 1 MG tablet  Commonly known as:  KLONOPIN  TAKE 1 TABLET BY MOUTH AT BEDTIME AS NEEDED FOR ANXIETY     ezetimibe 10 MG tablet  Commonly known as:  ZETIA  Take 1 tablet (10 mg total) by mouth daily.     fish oil-omega-3 fatty acids 1000 MG capsule  Take 1 g by mouth at bedtime.     fluticasone 50 MCG/ACT nasal spray  Commonly known as:  FLONASE  Place 2 sprays into both nostrils daily. PATIENT NEEDS OFFICE VISIT FOR ADDITIONAL REFILLS     Lysine 500 MG Caps  Take 500 mg by mouth at bedtime.     multivitamin with minerals tablet  Take 1 tablet by mouth at bedtime.     ondansetron 4 MG tablet  Commonly known as:  ZOFRAN  Take 1 tablet (4 mg total) by mouth every 8 (eight) hours as needed for nausea or vomiting.     pantoprazole 20 MG tablet  Commonly known as:  PROTONIX  Take 1 tablet (20 mg total) by mouth daily.     potassium citrate 10 MEQ (1080 MG) SR tablet  Commonly known as:  UROCIT-K  Take 10 mEq by mouth at bedtime.     sertraline 50 MG tablet  Commonly known as:  ZOLOFT  Take 1 tablet (50 mg total) by mouth daily. PATIENT NEEDS OFFICE VISIT FOR ADDITIONAL REFILLS     sucralfate 1  GM/10ML suspension  Commonly known as:  CARAFATE  Take 10 mLs (1 g total) by mouth 4 (four) times daily -  with meals and at bedtime.        Disposition and follow-up:   [name] was discharged from [facility] in [place] condition.  At the hospital follow up visit please address:  1.  Continued improvement of functional status.  Resolution of GI symptoms given prescription of Protonix and sucralfate.  Resolution of possible hemoptysis.  Smoking cessation.  2.  Labs / imaging needed at time of follow-up: none  3.  Pending labs/ test needing follow-up: none  Follow-up Appointments:     Follow-up Information    Follow up with HAGER, BRYAN, PA-C On 06/25/2014.   Specialty:  Physician Assistant   Why:  See for Dr. Claiborne Billings at 11:30 am   Contact information:   [address] [place] [address] [phone]       Follow up with [name].   Why:  Physical and Occupational Therapy  Contact information:   [address] [place] [address] [phone]       Follow up with [name], MD. Go on 06/18/2014.   Specialty:  Family Medicine   Why:  10:00am for follow up   Contact information:   [place] [address] [phone]       Discharge Instructions: Discharge Instructions    Call MD for:  difficulty breathing, headache or visual disturbances    Complete by:  As directed      Call MD for:  extreme fatigue    Complete by:  As directed      Call MD for:  hives    Complete by:  As directed      Call MD for:  persistant dizziness or light-headedness    Complete by:  As directed      Call MD for:  persistant nausea and vomiting    Complete by:  As directed      Call MD for:  redness, tenderness, or signs of infection (pain, swelling, redness, odor or green/yellow discharge around incision site)    Complete by:  As directed      Call MD for:  severe uncontrolled pain    Complete  by:  As directed      Call MD for:  temperature >100.4    Complete by:  As directed      Diet - low sodium heart healthy    Complete by:  As directed      Increase activity slowly    Complete by:  As directed            Consultations:    Procedures Performed:  Dg Chest 2 View  06/03/2014   CLINICAL DATA:  LEFT-sided chest pain for 3 days. Chest pain radiating into the LEFT neck.  EXAM: CHEST  2 VIEW  COMPARISON:  None.  FINDINGS: The heart size and mediastinal contours are within normal limits. Both lungs are clear. The visualized skeletal structures are unremarkable.  IMPRESSION: No active cardiopulmonary disease.   Electronically Signed   By: Dereck Ligas M.D.   On: 06/03/2014 12:32   Dg Chest Port 1 View  06/05/2014   CLINICAL DATA:  Hemoptysis.  EXAM: PORTABLE CHEST - 1 VIEW  COMPARISON:  06/03/2014  FINDINGS: Support apparatus: Numerous leads and wires project over the chest.  Cardiomediastinal silhouette: Midline trachea. Normal heart size and mediastinal contours.  Pleura: Mild right hemidiaphragm elevation. No pleural effusion or pneumothorax.  Lungs: Clear  Other: No free intraperitoneal air.  IMPRESSION: Normal chest.   Electronically Signed   By: Abigail Miyamoto M.D.   On: 06/05/2014 08:07    Admission HPI:   Patient is a 59 year old female with a history of depression/anxiety and dyslipidemia who presents with several day history of chest pain. Patient states that her chest pain started 3 days ago. She states that the pain is located in the center of her chest above her sternum, and was 7 out of 10 in severity at its worst. Patient states that it is a constant quality that has an sensation of having to burp but not being able to. Patient states that the pain moves around her chest and has most significantly moved up to her neck and her jaw on the left. Patient denies any radiation to her left arm. Patient states that the pain is worsened by both exertion and eating. Patient states  that she will have the chest pain when she  walks some distance or walks upstairs. Patient states that her chest pain is associated with dyspnea and diaphoresis as well. Patient also states that the pain is worse when she sits up compared to laying down flat. Patient also reports some associated sour taste in her mouth. Patient states that she has taken Advil for her symptoms without any alleviation. Patient denies that the Advil made her pain worse. Patient denies any sick contacts at home but only lives at home with dogs.  Patient is also reporting associated vomiting over the last several days. Patient states that she hasn't been able to keep much food down and is just vomiting gastric juices. However, this morning, patient noticed some amount of blood but denies any coffee-ground or blood clots emesis. Patient otherwise denying any pleuritic pain, dysphagia, odynophagia, productive cough, hemoptysis, or small rashes, fevers, dysuria, hematuria. Patient does report having a tick bite on the inner aspect of her right thigh but denies any associated rash with the lesion.   Hospital Course by problem list:   GERD/gastroenteritis: Patient initially presented sternal chest pain radiating to the left jaw concerning for ACS. Patient's risk factors include tobacco use and hyperlipidemia. Patient states that she has a higher catheterization with no records in our current system. Patient did not exhibit any concerning EKG changes on serial exams. Troponins were negative 3. Patient also had concurrent nausea and vomiting as well as diarrhea which made a GI etiology for her symptoms much more likely. Patient responded well to a GI cocktail as well as Protonix. Throughout her admission, her nausea and vomiting resolved and by the day of discharge, patient was tolerating a regular diet. However, patient's functional status had declined compared to her baseline with diffuse weakness. This was thought to be secondary to  her poor nutritional status over the preceding days before admission. Patient had no focal neurological deficits. Patient did not have orthostatic hypotension. Patient will require further physical therapy as an outpatient for full recovery. Patient was encouraged to stop smoking. An outpatient stress test may be of value if her symptoms do not improve.  Possible hemoptysis: Patient reporting some scant blood in her sputum towards the end of her admission. This was likely secondary to her repeated episodes of vomiting. Her hemoglobin remained stable at 11.5. Patient was otherwise hemodynamically stable. Serial chest x-rays were unremarkable for any acute cardiopulmonary process. Should the symptom continued, patient may warrant further GI workup.  Depression/anxiety: Patient was continued on her home dosages of Xanax, Klonopin, and sertraline. There were no acute issues during her hospitalization.  Hyperlipidemia: Patient was continued on her home dosages of study zetia and fish oil. Patient is not on a statin despite's high cholesterol of 229 and LDL of 147 from 7 months ago because of myalgia reaction to statins.   Discharge Vitals:   BP 101/62 mmHg  Pulse 87  Temp(Src) 98.9 F (37.2 C) (Oral)  Resp 15  Ht 5\' 2"  (1.575 m)  Wt 56.926 kg (125 lb 8 oz)  BMI 22.95 kg/m2  SpO2 98%  Discharge Labs:  No results found for this or any previous visit (from the past 24 hour(s)).  Signed: Luan Moore, MD 06/06/2014, 4:43 PM    Services Ordered on Discharge: PT Equipment Ordered on Discharge: none

## 2014-06-06 NOTE — Telephone Encounter (Signed)
Dr. Everlene Farrier,  Merrianne Mccumbers would like for you to call her @ [phone]  Thank you,

## 2014-06-06 NOTE — Telephone Encounter (Signed)
Please dictate a letter and give her the time off that she needs and I will sign it so we can fax it to her employer.

## 2014-06-18 ENCOUNTER — Encounter: Payer: Self-pay | Admitting: Emergency Medicine

## 2014-06-18 ENCOUNTER — Encounter: Payer: Self-pay | Admitting: *Deleted

## 2014-06-18 ENCOUNTER — Ambulatory Visit (INDEPENDENT_AMBULATORY_CARE_PROVIDER_SITE_OTHER): Payer: BC Managed Care – PPO | Admitting: Emergency Medicine

## 2014-06-18 VITALS — BP 105/70 | HR 97 | Temp 98.3°F | Resp 16 | Ht 62.5 in | Wt 123.0 lb

## 2014-06-18 DIAGNOSIS — R8290 Unspecified abnormal findings in urine: Secondary | ICD-10-CM

## 2014-06-18 DIAGNOSIS — J309 Allergic rhinitis, unspecified: Secondary | ICD-10-CM

## 2014-06-18 DIAGNOSIS — F32A Depression, unspecified: Secondary | ICD-10-CM

## 2014-06-18 DIAGNOSIS — F329 Major depressive disorder, single episode, unspecified: Secondary | ICD-10-CM

## 2014-06-18 DIAGNOSIS — R1084 Generalized abdominal pain: Secondary | ICD-10-CM

## 2014-06-18 DIAGNOSIS — R112 Nausea with vomiting, unspecified: Secondary | ICD-10-CM

## 2014-06-18 LAB — POCT URINALYSIS DIPSTICK
Bilirubin, UA: NEGATIVE
Glucose, UA: NEGATIVE
KETONES UA: NEGATIVE
Leukocytes, UA: NEGATIVE
Nitrite, UA: NEGATIVE
Protein, UA: NEGATIVE
Spec Grav, UA: <=1.005
UROBILINOGEN UA: 0.2
pH, UA: 6

## 2014-06-18 LAB — BASIC METABOLIC PANEL
BUN: 7 mg/dL (ref 6–23)
CALCIUM: 9.3 mg/dL (ref 8.4–10.5)
CHLORIDE: 103 meq/L (ref 96–112)
CO2: 27 meq/L (ref 19–32)
Creat: 0.73 mg/dL (ref 0.50–1.10)
Glucose, Bld: 93 mg/dL (ref 70–99)
POTASSIUM: 4.2 meq/L (ref 3.5–5.3)
Sodium: 139 mEq/L (ref 135–145)

## 2014-06-18 LAB — CBC
HCT: 39.2 % (ref 36.0–46.0)
Hemoglobin: 13.5 g/dL (ref 12.0–15.0)
MCH: 32.9 pg (ref 26.0–34.0)
MCHC: 34.4 g/dL (ref 30.0–36.0)
MCV: 95.6 fL (ref 78.0–100.0)
MPV: 8.6 fL (ref 8.6–12.4)
PLATELETS: 318 10*3/uL (ref 150–400)
RBC: 4.1 MIL/uL (ref 3.87–5.11)
RDW: 12.9 % (ref 11.5–15.5)
WBC: 5.9 10*3/uL (ref 4.0–10.5)

## 2014-06-18 LAB — LIPASE: Lipase: 42 U/L (ref 0–75)

## 2014-06-18 MED ORDER — SERTRALINE HCL 50 MG PO TABS: 50.0000 mg | ORAL_TABLET | Freq: Every day | ORAL | Status: DC

## 2014-06-18 MED ORDER — FLUTICASONE PROPIONATE 50 MCG/ACT NA SUSP: 2.0000 | Freq: Every day | NASAL | Status: DC

## 2014-06-18 NOTE — Patient Instructions (Signed)
Will call with your lab results. You will get a call to make an appointment with GI. Follow up with cardiology. Continue to slowly advance your diet. Drink plenty of fluids. Smoke as little as possible! Call with any problems/concerns.

## 2014-06-18 NOTE — Progress Notes (Signed)
Subjective:    Patient ID: Beth Rice, female    DOB: 03-24-1956, 59 y.o.   MRN: [id number]  HPI  This is a 59 year old female with PMH HLD, anxiety/depression and tobacco abuse who is presenting for hospital follow up. She was discharged from the hospital 12 days ago after a 3 day admission for chest pain, N/V/D and hemoptysis. Hemoptysis was scant blood in sputum - no coffee ground emesis or blood clots vomit. Thought to be d/t repeated episodes of vomting. Chest pain was left sided and radiated to left jaw. Associated with burping sensation. She responded well to GI cocktail and protonix. EKG, CXR and troponin x 3 negative. Symptoms resolved by time of discharge. Thought to be GI related. She was discharged on protonix 20 mg and sucralfate.   She reports overall since discharge she is doing much better. She has vomited once 4 days after discharge. It was non-bloody. She has not had diarrhea since discharge. No longer having CP but continues to have a sensation that she needs to belch and occasionally feels nauseated. She has abdominal tenderness. She never had fever or chills. No recent abx, no sick contacts and no recent travel. She is eating a bland diet - sandwiches, toast and fruit. She was very weak in her legs after getting out of the hospital. She had one home PT session. She was initially using a walker but has gained enough strength that she does not need assistance anymore. She states the protonix and sucralfate are helping. She never had problems with GERD before. She reports she had a catheterization 5 years ago d/t CP - no stent placed and no CP since then until now. Before getting sick she was walking on a treadmill 3x a week for 30 minutes at a time and never had problems with CP or SOB with exertion.    She is also noting an odor to her urine. She denies dysuria or vaginal discharge/bleeding. Not having back pain.  Review of Systems  Constitutional: Negative for fever and  chills.  Respiratory: Negative for shortness of breath.   Cardiovascular: Negative for chest pain.  Gastrointestinal: Positive for nausea and abdominal pain. Negative for vomiting and diarrhea.  Genitourinary: Negative for dysuria, frequency, hematuria, vaginal bleeding and vaginal discharge.  Skin: Negative for rash.  Allergic/Immunologic: Positive for environmental allergies.  Neurological: Positive for weakness.  Hematological: Negative for adenopathy.   Patient Active Problem List   Diagnosis Date Noted  . Chest pain 06/03/2014  . Lumbar pain 10/25/2013  . Other and unspecified hyperlipidemia 10/02/2012  . Depression 10/02/2012   Prior to Admission medications   Medication Sig Start Date End Date Taking? Authorizing Provider  ALPRAZolam Duanne Moron) 0.5 MG tablet TAKE 1 TABLET BY MOUTH EVERY 6 HOURS AS NEEDED FOR MUSCLE SPASMS 03/18/14  Yes Darlyne Russian, MD  aspirin EC 81 MG tablet Take 81 mg by mouth daily.   Yes Historical Provider, MD  b complex vitamins capsule Take 1 capsule by mouth at bedtime.    Yes Historical Provider, MD  Calcium Carbonate-Vitamin D (CALCIUM + D PO) Take 1 tablet by mouth at bedtime.    Yes Historical Provider, MD  clonazePAM (KLONOPIN) 1 MG tablet TAKE 1 TABLET BY MOUTH AT BEDTIME AS NEEDED FOR ANXIETY Patient taking differently: TAKE 1 TABLET BY MOUTH AT BEDTIME  FOR ANXIETY 03/13/14  Yes Darlyne Russian, MD  ezetimibe (ZETIA) 10 MG tablet Take 1 tablet (10 mg total) by mouth daily.  10/23/13  Yes Darlyne Russian, MD  fish oil-omega-3 fatty acids 1000 MG capsule Take 1 g by mouth at bedtime.    Yes Historical Provider, MD  fluticasone (FLONASE) 50 MCG/ACT nasal spray Place 2 sprays into both nostrils daily. 06/18/14  Yes Bennett Scrape V, PA-C  Lysine 500 MG CAPS Take 500 mg by mouth at bedtime.    Yes Historical Provider, MD  Multiple Vitamins-Minerals (MULTIVITAMIN WITH MINERALS) tablet Take 1 tablet by mouth at bedtime.    Yes Historical Provider, MD  ondansetron  (ZOFRAN) 4 MG tablet Take 1 tablet (4 mg total) by mouth every 8 (eight) hours as needed for nausea or vomiting. 06/05/14  Yes Luan Moore, MD  pantoprazole (PROTONIX) 20 MG tablet Take 1 tablet (20 mg total) by mouth daily. 06/05/14  Yes Luan Moore, MD  potassium citrate (UROCIT-K) 10 MEQ (1080 MG) SR tablet Take 10 mEq by mouth at bedtime.   Yes Historical Provider, MD  sertraline (ZOLOFT) 50 MG tablet Take 1 tablet (50 mg total) by mouth daily. 06/18/14  Yes Bennett Scrape V, PA-C  sucralfate (CARAFATE) 1 GM/10ML suspension Take 10 mLs (1 g total) by mouth 4 (four) times daily -  with meals and at bedtime. 06/05/14  Yes Luan Moore, MD   Allergies  Allergen Reactions  . Penicillins Hives  . Statins     Patient has significant muscle cramps with statins. She cannot tolerate them.  . Sulfa Antibiotics Hives   Patient's social and family history were reviewed.     Objective:   Physical Exam  Constitutional: She is oriented to person, place, and time. She appears well-developed and well-nourished. No distress.  HENT:  Head: Normocephalic and atraumatic.  Right Ear: Hearing normal.  Left Ear: Hearing normal.  Nose: Nose normal.  Mouth/Throat: Uvula is midline, oropharynx is clear and moist and mucous membranes are normal.  Eyes: Conjunctivae and lids are normal. Right eye exhibits no discharge. Left eye exhibits no discharge. No scleral icterus.  Cardiovascular: Normal rate, regular rhythm, normal heart sounds, intact distal pulses and normal pulses.   No murmur heard. Pulmonary/Chest: Effort normal and breath sounds normal. No respiratory distress. She has no wheezes. She has no rhonchi. She has no rales.  Abdominal: Soft. Normal appearance and bowel sounds are normal. There is generalized tenderness. There is no CVA tenderness.  Musculoskeletal: Normal range of motion.  Lymphadenopathy:    She has no cervical adenopathy.  Neurological: She is alert and oriented to person, place, and time.    Skin: Skin is warm, dry and intact. No lesion and no rash noted.  Psychiatric: She has a normal mood and affect. Her speech is normal and behavior is normal. Thought content normal.   BP 105/70 mmHg  Pulse 97  Temp(Src) 98.3 F (36.8 C)  Resp 16  Ht 5' 2.5" (1.588 m)  Wt 123 lb (55.792 kg)  BMI 22.12 kg/m2  SpO2 96%  Results for orders placed or performed in visit on 06/18/14  POCT urinalysis dipstick  Result Value Ref Range   Color, UA yellow    Clarity, UA clear    Glucose, UA neg    Bilirubin, UA neg    Ketones, UA neg    Spec Grav, UA <=1.005    Blood, UA small    pH, UA 6.0    Protein, UA neg    Urobilinogen, UA 0.2    Nitrite, UA neg    Leukocytes, UA Negative  Assessment & Plan:  1. Abnormal urine odor UA negative. If odor does not improve or if develops symptoms, she should return for eval. - POCT urinalysis dipstick  2. Generalized abdominal pain 3. Non-intractable vomiting with nausea, vomiting of unspecified type Referred urgently to GI. Labs below pending. She will slowly advance her diet and drink plenty of fluids. Continue on protonix and sucralfate. Return if symptoms worsen. - CBC - Ambulatory referral to Gastroenterology - Basic metabolic panel - Lipase - Ambulatory referral to Gastroenterology  4. Depression - sertraline (ZOLOFT) 50 MG tablet; Take 1 tablet (50 mg total) by mouth daily.  Dispense: 30 tablet; Refill: 11  5. Allergic rhinitis, unspecified allergic rhinitis type - fluticasone (FLONASE) 50 MCG/ACT nasal spray; Place 2 sprays into both nostrils daily.  Dispense: 16 g; Refill: 11   Massie Cogliano V. Drenda Freeze, MHS [facility]  06/18/2014

## 2014-06-21 ENCOUNTER — Other Ambulatory Visit: Payer: Self-pay | Admitting: Emergency Medicine

## 2014-06-24 ENCOUNTER — Telehealth: Payer: Self-pay

## 2014-06-24 NOTE — Telephone Encounter (Signed)
Patient should RTC for re-evaluation or contact the GI specialist.

## 2014-06-24 NOTE — Telephone Encounter (Signed)
Spoke with pt, she states she developed a rash on her stomach, hands, neck, and scalp from the probiotic she was given. She was referring to the Protonix and I advised her to call Dr. Raelene Bott. She states she stopped taking the medication. She has no fever and she hasn't tried anything for the reaction. I advised pt to come in but she states she is stuck at home due to the weather. FYI Dr. Everlene Farrier

## 2014-06-24 NOTE — Telephone Encounter (Signed)
Faxed

## 2014-06-24 NOTE — Telephone Encounter (Signed)
Spoke with pt, advised message from [place]. Pt understood and will come to be seen. She is going to try to call the appt center to see if she can be fit in to see Dr. Everlene Farrier. Does he have any openings?

## 2014-06-24 NOTE — Telephone Encounter (Signed)
Pt states she is having a reaction to the medication she was given, refused to come in as advised. Please call pt at [phone]

## 2014-06-24 NOTE — Telephone Encounter (Signed)
Pt CB and reported that she was put on protonix and carafate at hospital about 3 wks ago and about 1 week ago a rash/itching started and has slowly progressed so that now it is on her neck, scalp,hands, back and around anus. She stopped taking both medications and had a neighbor bring her some benedryl. She took 25 mg this afternoon. Pt has had no SOB, swelling of throat, lips, tongue. Pt reports that she does have some Prednisone 10 mg on hand, but has not taken any. She states she has not had any other changes in medication, diet or lotions/soaps/detergents. Please advise. Sending to PA pool in Dr Perfecto Kingdom absence.

## 2014-06-25 ENCOUNTER — Ambulatory Visit (INDEPENDENT_AMBULATORY_CARE_PROVIDER_SITE_OTHER): Payer: BC Managed Care – PPO | Admitting: Emergency Medicine

## 2014-06-25 ENCOUNTER — Ambulatory Visit (INDEPENDENT_AMBULATORY_CARE_PROVIDER_SITE_OTHER): Payer: BC Managed Care – PPO | Admitting: Physician Assistant

## 2014-06-25 ENCOUNTER — Other Ambulatory Visit: Payer: Self-pay | Admitting: Emergency Medicine

## 2014-06-25 ENCOUNTER — Encounter: Payer: Self-pay | Admitting: Family Medicine

## 2014-06-25 ENCOUNTER — Encounter: Payer: Self-pay | Admitting: Physician Assistant

## 2014-06-25 VITALS — BP 105/74 | HR 106 | Temp 98.1°F | Resp 16 | Ht 62.0 in | Wt 124.0 lb

## 2014-06-25 VITALS — BP 112/68 | HR 102 | Ht 62.0 in | Wt 125.0 lb

## 2014-06-25 DIAGNOSIS — R079 Chest pain, unspecified: Secondary | ICD-10-CM

## 2014-06-25 DIAGNOSIS — G4700 Insomnia, unspecified: Secondary | ICD-10-CM

## 2014-06-25 DIAGNOSIS — Z0271 Encounter for disability determination: Secondary | ICD-10-CM

## 2014-06-25 DIAGNOSIS — Z720 Tobacco use: Secondary | ICD-10-CM | POA: Insufficient documentation

## 2014-06-25 DIAGNOSIS — L299 Pruritus, unspecified: Secondary | ICD-10-CM

## 2014-06-25 DIAGNOSIS — E860 Dehydration: Secondary | ICD-10-CM

## 2014-06-25 DIAGNOSIS — L309 Dermatitis, unspecified: Secondary | ICD-10-CM

## 2014-06-25 MED ORDER — SALICYLIC ACID 3 % EX SHAM: 1.0000 "application " | MEDICATED_SHAMPOO | Freq: Every day | CUTANEOUS | Status: DC

## 2014-06-25 MED ORDER — RANITIDINE HCL 150 MG PO TABS: 150.0000 mg | ORAL_TABLET | Freq: Two times a day (BID) | ORAL | Status: DC

## 2014-06-25 MED ORDER — ALPRAZOLAM 0.5 MG PO TABS: ORAL_TABLET | ORAL | Status: DC

## 2014-06-25 MED ORDER — LEVOCETIRIZINE DIHYDROCHLORIDE 5 MG PO TABS
5.0000 mg | ORAL_TABLET | Freq: Every evening | ORAL | Status: DC
Start: 2014-06-25 — End: 2014-08-19

## 2014-06-25 NOTE — Assessment & Plan Note (Signed)
Resolved since discharge from the hospital. Patient reports she never thought it was her heart and thought it was related to her GI issues. She walks regularly on treadmill and had been doing so prior to her admission.  She had no chest pain during her exercise periods.

## 2014-06-25 NOTE — Patient Instructions (Signed)
Your physician recommends that you schedule a follow-up appointment in: 3 months with Dr. Claiborne Billings. No changes were made today in your therapy.

## 2014-06-25 NOTE — Progress Notes (Signed)
Subjective:    Patient ID: Beth Rice, female    DOB: 09-18-1955, 59 y.o.   MRN: [id number]  HPI   This is a 59 year old female with PMH HLD, anxiety/depression and tobacco abuse who is presenting with rash x 4 days. She was discharged from the hospital 19 days ago after a 3 day admission for chest pain, N/V/D and hemoptysis. Hemoptysis was scant blood in sputum - no coffee ground emesis or blood clots vomit. All cardiac testing negative and thought to be GI related. She was discharged on protonix 20 mg. At follow up 1 week ago she was doing much better but still a little nauseated and very weak from her hospital stay. Today she is doing much better, no longer nauseated, no chest pain and getting her strength back. She is advancing her diet and eating plenty of dairy. At last visit we added carafate and referred her to GI. Today she is stating since getting back from the hospital she has had small bumps over her bilateral fingers. These bumps became itchy 4 days ago. Shortly after her scalp, trunk and legs became itchy as well. She has tried benadryl with some relief.  She saw cardiology today who also felt her chest pain was not cardiac in nature. She has not been scheduled with GI yet. She reports she quit smoking 5 days ago.  She is also needing alprazolam refilled for sleep.   Review of Systems  Constitutional: Negative for fever and chills.  Respiratory: Negative for cough and shortness of breath.   Cardiovascular: Negative for chest pain and palpitations.  Gastrointestinal: Negative for nausea, vomiting, abdominal pain, diarrhea, constipation and blood in stool.  Skin: Positive for rash.  Allergic/Immunologic: Negative for environmental allergies.  Hematological: Negative for adenopathy.   Patient Active Problem List   Diagnosis Date Noted  . Tobacco abuse 06/25/2014  . Chest pain 06/03/2014  . Lumbar pain 10/25/2013  . Other and unspecified hyperlipidemia 10/02/2012  .  Depression 10/02/2012   Prior to Admission medications   Medication Sig Start Date End Date Taking? Authorizing Provider  ALPRAZolam Duanne Moron) 0.5 MG tablet TAKE 1 TABLET BY MOUTH EVERY 6 HOURS AS NEEDED FOR MUSCLE SPASMS 06/25/14  Yes Darlyne Russian, MD  aspirin EC 81 MG tablet Take 81 mg by mouth daily.   Yes Historical Provider, MD  b complex vitamins capsule Take 1 capsule by mouth at bedtime.    Yes Historical Provider, MD  Calcium Carbonate-Vitamin D (CALCIUM + D PO) Take 1 tablet by mouth at bedtime.    Yes Historical Provider, MD  clonazePAM (KLONOPIN) 1 MG tablet TAKE 1 TABLET AT BEDTIME AS NEEDED FOR ANXIETY 06/23/14  Yes Darlyne Russian, MD  ezetimibe (ZETIA) 10 MG tablet Take 1 tablet (10 mg total) by mouth daily. 10/23/13  Yes Darlyne Russian, MD  fish oil-omega-3 fatty acids 1000 MG capsule Take 1 g by mouth at bedtime.    Yes Historical Provider, MD  fluticasone (FLONASE) 50 MCG/ACT nasal spray Place 2 sprays into both nostrils daily. 06/18/14  Yes Bennett Scrape V, PA-C  Lysine 500 MG CAPS Take 500 mg by mouth at bedtime.    Yes Historical Provider, MD  Multiple Vitamins-Minerals (MULTIVITAMIN WITH MINERALS) tablet Take 1 tablet by mouth at bedtime.    Yes Historical Provider, MD  ondansetron (ZOFRAN) 4 MG tablet Take 1 tablet (4 mg total) by mouth every 8 (eight) hours as needed for nausea or vomiting. 06/05/14  Yes  Luan Moore, MD  potassium citrate (UROCIT-K) 10 MEQ (1080 MG) SR tablet Take 10 mEq by mouth at bedtime.   Yes Historical Provider, MD  sertraline (ZOLOFT) 50 MG tablet Take 1 tablet (50 mg total) by mouth daily. 06/18/14  Yes Bennett Scrape V, PA-C                        Allergies  Allergen Reactions  . Penicillins Hives  . Statins     Patient has significant muscle cramps with statins. She cannot tolerate them.  . Sulfa Antibiotics Hives   Patient's social and family history were reviewed.     Objective:   Physical Exam  Constitutional: She is oriented to person, place,  and time. She appears well-developed and well-nourished. No distress.  HENT:  Head: Normocephalic and atraumatic.  Right Ear: Hearing normal.  Left Ear: Hearing normal.  Nose: Nose normal.  Eyes: Conjunctivae and lids are normal. Right eye exhibits no discharge. Left eye exhibits no discharge. No scleral icterus.  Cardiovascular: Normal rate, regular rhythm, normal heart sounds, intact distal pulses and normal pulses.   No murmur heard. Pulmonary/Chest: Effort normal and breath sounds normal. No respiratory distress. She has no wheezes. She has no rhonchi. She has no rales.  Musculoskeletal: Normal range of motion.  Neurological: She is alert and oriented to person, place, and time.  Skin: Skin is warm and dry.  Small flesh colored papules over bilateral fingers. Forearms with red linear markings from scratching. Base of neck with erythema and slight scaling  Psychiatric: She has a normal mood and affect. Her speech is normal and behavior is normal. Thought content normal.   BP 88/64 mmHg  Pulse 97  Temp(Src) 98.1 F (36.7 C) (Oral)  Resp 16  Ht 5\' 2"  (1.575 m)  Wt 124 lb (56.246 kg)  BMI 22.67 kg/m2  SpO2 96%  Orthostatic VS for the past 24 hrs:  BP- Lying Pulse- Lying BP- Sitting Pulse- Sitting BP- Standing at 0 minutes Pulse- Standing at 0 minutes  06/25/14 1505 110/73 mmHg 82 114/71 mmHg 92 105/74 mmHg 106        Assessment & Plan:  1. Dermatitis 2. Itchy scalp Stop protonix and carafate. Start xyzal, zantac and selsun blue shampoo. Return if not improving in 5-7 days. She will call GI office to schedule appt.  - ranitidine (ZANTAC) 150 MG tablet; Take 1 tablet (150 mg total) by mouth 2 (two) times daily.  Dispense: 60 tablet; Refill: 1 - levocetirizine (XYZAL) 5 MG tablet; Take 1 tablet (5 mg total) by mouth every evening.  Dispense: 30 tablet; Refill: 1  3. Insomnia Refill alprazolam. - ALPRAZolam (XANAX) 0.5 MG tablet; TAKE 1 TABLET BY MOUTH EVERY 6 HOURS AS NEEDED  FOR MUSCLE SPASMS  Dispense: 30 tablet; Refill: 2  4. Dehydration Likely dehydrated and not getting enough electrolytes. Counseled no need to restrict diet and should drink water with electrolytes or gatorade.   Benjaman Pott Drenda Freeze, MHS [facility]  06/25/2014

## 2014-06-25 NOTE — Patient Instructions (Signed)
Use shampoo daily. Take zyrtec each evening. Take zantac twice a day. Stop the protonix and carafate. Call GI office - [phone] and tell them you were referred and your medical records were faxed over on 06/19/14. Let us know if your symptoms do not improve.

## 2014-06-25 NOTE — Telephone Encounter (Signed)
Patient got a refill for ALPRAZolam (XANAX) 0.5 MG tablet   She needs lorasapam

## 2014-06-25 NOTE — Progress Notes (Signed)
Patient ID: Beth Rice, female   DOB: Jul 24, 1955, 59 y.o.   MRN: [id number]    Date:  06/25/2014   ID:  Beth Rice, DOB Dec 31, 1955, MRN [id number]  PCP:  [name], MD  Primary Cardiologist:  Claiborne Billings   Chief Complaint  Patient presents with  . post hospital     History of Present Illness: Beth Rice is a 59 y.o. female issue of tobacco abuse, GERD, depression, hyperlipidemia, anxiety. She has not seen Dr. Claiborne Billings since 2011. 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 She was recently hospitalized after 3 days of nausea vomiting diarrhea. She describes some chest pain when she was admitted and was seen by cardiology. Her troponins were negative.  EKG showed sinus rhythm with a rate of 99 bpm. The right axis deviation.  Patient presents today for a posthospital follow-up she reports feeling much better she's continuing to gain her strength back. Prior to her hospitalization she had been walking daily on her treadmill without difficulties. She's had no chest pain since her discharge. She presented at all for rash with convex and has stopped it. She also equates she some shortness of breath with the rash as well.  She said she stopped smoking last Friday. She also states that she will start back on her treadmill.  The patient currently denies nausea, vomiting, fever, chest pain, orthopnea, dizziness, PND, cough, congestion, abdominal pain, hematochezia, melena, lower extremity edema, claudication.  Wt Readings from Last 3 Encounters:  06/25/14 125 lb (56.7 kg)  06/18/14 123 lb (55.792 kg)  06/05/14 125 lb 8 oz (56.926 kg)     Past Medical History  Diagnosis Date  . Anxiety   . Elevated cholesterol   . Depression   . Chest pain     a. 09/2008 Cath: ER 60%, LM nl, LAD 48m, 73m/d, LCX nondom, nl, RCA dom, 13m, PDA/PLA nl.  . Tobacco abuse   . H/O degenerative disc disease   .  Myocardial infarction     "I think I had a heart attack in 2010"  . Headache     "frequency depends on the weather" (06/04/2014)  . Arthritis     "neck" (06/04/2014)  . Chronic lower back pain   . Gastroesophageal reflux disease     H/O    Current Outpatient Prescriptions  Medication Sig Dispense Refill  . ALPRAZolam (XANAX) 0.5 MG tablet TAKE 1 TABLET BY MOUTH EVERY 6 HOURS AS NEEDED FOR MUSCLE SPASMS 30 tablet 2  . aspirin EC 81 MG tablet Take 81 mg by mouth daily.    Marland Kitchen b complex vitamins capsule Take 1 capsule by mouth at bedtime.     . Calcium Carbonate-Vitamin D (CALCIUM + D PO) Take 1 tablet by mouth at bedtime.     . clonazePAM (KLONOPIN) 1 MG tablet TAKE 1 TABLET AT BEDTIME AS NEEDED FOR ANXIETY 90 tablet 0  . ezetimibe (ZETIA) 10 MG tablet Take 1 tablet (10 mg total) by mouth daily. 30 tablet 11  . fish oil-omega-3 fatty acids 1000 MG capsule Take 1 g by mouth at bedtime.     . fluticasone (FLONASE) 50 MCG/ACT nasal spray Place 2 sprays into both nostrils daily. 16 g 11  . Lysine 500 MG CAPS Take 500 mg by mouth at bedtime.     . Multiple Vitamins-Minerals (MULTIVITAMIN WITH MINERALS) tablet Take 1 tablet by mouth at bedtime.     Marland Kitchen  ondansetron (ZOFRAN) 4 MG tablet Take 1 tablet (4 mg total) by mouth every 8 (eight) hours as needed for nausea or vomiting. 20 tablet 0  . potassium citrate (UROCIT-K) 10 MEQ (1080 MG) SR tablet Take 10 mEq by mouth at bedtime.    . sertraline (ZOLOFT) 50 MG tablet Take 1 tablet (50 mg total) by mouth daily. 30 tablet 11   No current facility-administered medications for this visit.    Allergies:    Allergies  Allergen Reactions  . Penicillins Hives  . Statins     Patient has significant muscle cramps with statins. She cannot tolerate them.  . Sulfa Antibiotics Hives    Social History:  The patient  reports that she has been smoking Cigarettes.  She has a 20 pack-year smoking history. She has never used smokeless tobacco. She reports that she  drinks about 1.8 oz of alcohol per week. She reports that she does not use illicit drugs.   Family history:   Family History  Problem Relation Age of Onset  . Hypertension Sister   . Heart disease Mother   . Alzheimer's disease Mother   . Cerebral aneurysm Father   . Mental illness Father     ROS:  Please see the history of present illness.  All other systems reviewed and negative.   PHYSICAL EXAM: VS:  BP 112/68 mmHg  Pulse 102  Ht 5\' 2"  (1.575 m)  Wt 125 lb (56.7 kg)  BMI 22.86 kg/m2 Well nourished, well developed, in no acute distress HEENT: Pupils are equal round react to light accommodation extraocular movements are intact.  Neck: no JVDNo cervical lymphadenopathy. Cardiac: Regular rate and rhythm without murmurs rubs or gallops. Lungs:  clear to auscultation bilaterally, no wheezing, rhonchi or rales Abd: soft, nontender, positive bowel sounds all quadrants, no hepatosplenomegaly Ext: no lower extremity edema.  2+ radial and dorsalis pedis pulses. Skin: warm and dry Neuro:  Grossly normal     ASSESSMENT AND PLAN:  Problem List Items Addressed This Visit    Tobacco abuse    She reports that she quit last Friday. I offered additional help should she need it. Recommended 1 800 quit line should she feel the urge.      Chest pain - Primary    Resolved since discharge from the hospital. Patient reports she never thought it was her heart and thought it was related to her GI issues. She walks regularly on treadmill and had been doing so prior to her admission.  She had no chest pain during her exercise periods.           Follow-up with Dr. Claiborne Billings in 3 months

## 2014-06-25 NOTE — Assessment & Plan Note (Signed)
She reports that she quit last Friday. I offered additional help should she need it. Recommended 1 800 quit line should she feel the urge.

## 2014-06-25 NOTE — Telephone Encounter (Signed)
I will check with [facility] and see if she can get fitted into her schedule for today

## 2014-06-26 ENCOUNTER — Other Ambulatory Visit: Payer: Self-pay

## 2014-06-27 ENCOUNTER — Telehealth: Payer: Self-pay

## 2014-06-27 NOTE — Telephone Encounter (Signed)
Patient was recently seen by Bennett Scrape and Dr. Everlene Farrier and is requesting a medication refill for klonopin sent to [facility]. Patient phone: [phone]

## 2014-06-27 NOTE — Telephone Encounter (Signed)
Called in Rx to [facility] [address]. Pt notified.

## 2014-07-02 ENCOUNTER — Ambulatory Visit (HOSPITAL_COMMUNITY)
Admission: RE | Admit: 2014-07-02 | Discharge: 2014-07-02 | Disposition: A | Discharge status: Home / Self Care | Payer: BC Managed Care – PPO | Source: Ambulatory Visit | Attending: Emergency Medicine | Admitting: Emergency Medicine

## 2014-07-02 ENCOUNTER — Ambulatory Visit (INDEPENDENT_AMBULATORY_CARE_PROVIDER_SITE_OTHER): Payer: BC Managed Care – PPO | Admitting: Emergency Medicine

## 2014-07-02 VITALS — BP 105/66 | HR 101 | Temp 99.1°F | Resp 16 | Ht 63.0 in | Wt 125.0 lb

## 2014-07-02 DIAGNOSIS — R269 Unspecified abnormalities of gait and mobility: Secondary | ICD-10-CM

## 2014-07-02 DIAGNOSIS — G44219 Episodic tension-type headache, not intractable: Secondary | ICD-10-CM | POA: Insufficient documentation

## 2014-07-02 NOTE — Progress Notes (Addendum)
   Subjective:    Patient ID: Beth Rice, female    DOB: September 16, 1955, 59 y.o.   MRN: [id number] This chart was scribed for Arlyss Queen, MD by Zola Button, Medical Scribe. This patient was seen in room 21 and the patient's care was started at 3:11 PM.   HPI HPI Comments: DAILY DOE is a 59 y.o. female who presents to the [facility] for a follow-up. She was admitted to the hospital from 06/03/14 - 06/05/14 for symptoms including chest, pain, nausea, vomiting and diarrhea. Patient has been trying to eat real/solid food, but with difficulty holding the food down. She feels as if her stomach has not been emptying, and she has been having some abdominal tenderness and chills. The symptoms have been going on for about 3 weeks. She has returned to her diet of oatmeal, yogurt, fruit, etc. She has been drinking boost and maintaining her weight. Patient has been taking her medications and having bowel movements. She notes that stools have been formed up until yesterday and today, but she has not had diarrhea. No blood was found in her stool during her GI checkup with Dr. Collene Mares; she was instructed to start taking probiotics. Patient denies fever. She also denies EtOH use. She has not been doing therapy, but she is able to walk steadily.  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 She has been pressured to return to work soon, but she does not feel ready to return to work.  Review of Systems  Constitutional: Positive for chills. Negative for fever and unexpected weight change.  Gastrointestinal: Positive for abdominal pain. Negative for diarrhea.  Musculoskeletal: Negative for gait problem.       Objective:   Physical Exam CONSTITUTIONAL: Well developed/well nourished HEAD: Normocephalic/atraumatic EYES: EOM/PERRL ENMT: Mucous membranes moist NECK: supple no meningeal signs SPINE: entire spine nontender CV: S1/S2 noted, no murmurs/rubs/gallops  noted LUNGS: Lungs are clear to auscultation bilaterally, no apparent distress ABDOMEN: The abdomen is flat there is tenderness left upper abdomen without rebound. GU: no cva tenderness NEURO: Pt is awake/alert, moves all extremitiesx4 EXTREMITIES: pulses normal, full ROM SKIN: warm, color normal PSYCH: no abnormalities of mood noted        Assessment & Plan:   Patient has low-grade fever, persistent abdominal pain, and poor gastric emptying. On neurological her gait is somewhat wide. I checked her disc margins and they appeared flat. Dr. Collene Mares was kind enough to see her at her office. We'll do a follow-up CT of the head  either tonight or in the morning once I speak with the patient and see what is more convenient.I personally performed the services described in this documentation, which was scribed in my presence. The recorded information has been reviewed and is accurate.

## 2014-07-04 ENCOUNTER — Telehealth: Payer: Self-pay | Admitting: Emergency Medicine

## 2014-07-04 ENCOUNTER — Other Ambulatory Visit: Payer: Self-pay | Admitting: Gastroenterology

## 2014-07-04 DIAGNOSIS — R1310 Dysphagia, unspecified: Secondary | ICD-10-CM

## 2014-07-04 NOTE — Telephone Encounter (Addendum)
Patient's job faxed over [employer] forms. I have filled in most parts of the form using office visit notes however there are a couple of parts that need review. There is also a RTW form with the FMLA forms that needs to be completed. Please sign and date when complete. Return to FMLA/disabilities

## 2014-07-04 NOTE — Telephone Encounter (Signed)
Error

## 2014-07-09 ENCOUNTER — Ambulatory Visit
Admission: RE | Admit: 2014-07-09 | Discharge: 2014-07-09 | Disposition: A | Discharge status: Home / Self Care | Payer: BC Managed Care – PPO | Source: Ambulatory Visit | Attending: Gastroenterology | Admitting: Gastroenterology

## 2014-07-09 DIAGNOSIS — R1310 Dysphagia, unspecified: Secondary | ICD-10-CM

## 2014-08-14 ENCOUNTER — Ambulatory Visit (INDEPENDENT_AMBULATORY_CARE_PROVIDER_SITE_OTHER): Payer: BC Managed Care – PPO | Admitting: Emergency Medicine

## 2014-08-14 VITALS — BP 110/72 | HR 91 | Temp 98.9°F | Resp 18 | Wt 123.0 lb

## 2014-08-14 DIAGNOSIS — R269 Unspecified abnormalities of gait and mobility: Secondary | ICD-10-CM | POA: Diagnosis not present

## 2014-08-14 NOTE — Patient Instructions (Signed)
Please see me after you have had your MRI and carotid Doppler and seen the neurologist.

## 2014-08-14 NOTE — Progress Notes (Deleted)
   Subjective:    Patient ID: Beth Rice, female    DOB: 1955/09/21, 59 y.o.   MRN: [id number]  HPI    Review of Systems     Objective:   Physical Exam        Assessment & Plan:

## 2014-08-14 NOTE — Progress Notes (Addendum)
Subjective:  This chart was scribed for Nena Jordan, MD by Mercy Moore, Medial Scribe. This patient was seen in room 9 and the patient's care was started at 12:54 PM.    Patient ID: Beth Rice, female    DOB: 1955-07-23, 59 y.o.   MRN: [id number] Chief Complaint  Patient presents with  . Follow-up  . FMLA    HPI HPI Comments: Beth Rice is a 59 y.o. female who presents to the [facility] for follow up concerning AN unsteady gait that has persisted since a three day hospital admission for chest pain 1/4-1/6. Patient reports that she "is still really weak." Patient reports fatigue, sleep disturbance, difficulty walking, weight loss and headache. (Patient's chart history reveals no significant weight loss: one pound since 06/25/2014). Patient goes on to state that overall she is not feeling really well. Patient specifically reports "pitching to left" when walking. Patient denies dizziness or room spinning sensation. Patient reports that her symptoms are worse in the morning. Patient states that after her breakfast she begins shaking, which requires Xanax to manage. Patient reports that her mother has medical history of alzheimer's and parkinson's and this is concerning for her on medical issues.   Patient shares smoking cessation since her last visit. Patient is employed with [employer] and reports that FMLA is requiring a date for her retirement/leave. Patient shares that her effective retirement date is 10/30/2014, but her hiatus will extend until 10/29/2014 and should be documented so that she is able to receive payment for her leave.   When patient was admitted to the hospital, patient shares that she was experiencing chest pain, diarrhea and vomiting for three full days. Patient reports returning to work after that weekend, but reports that she was weak and severely dehydrated. Patient reports being admitted and receiving IV fluids and being discharged  with Protonix, which she found she was allergic to. Patient reports that in the hospital she had difficulty processing information and her sister who accompanied her, had to do much of the communicating. Patient reports continued symptoms since her admission stating that her diet primarily consists of oatmeal, because she is unable to eat much without onset of nausea and vomiting. Patient reports that she is depressed because she does not feel like herself stating that even her personality has changed. Patient reports resulting memory loss stating she has to write down when she bathes.  Patient reports that she lives alone but states that she has siblings, neighbors and friends that check in on her every day. Patient shares that she has four dogs at home. Patient reports that both of her parents are deceased: mother died with dementia and her father had history of cerebral hemorrhage at 65.   Patient Active Problem List   Diagnosis Date Noted  . Tobacco abuse 06/25/2014  . Chest pain 06/03/2014  . Lumbar pain 10/25/2013  . Other and unspecified hyperlipidemia 10/02/2012  . Depression 10/02/2012   Past Medical History  Diagnosis Date  . Anxiety   . Elevated cholesterol   . Depression   . Chest pain     a. 09/2008 Cath: ER 60%, LM nl, LAD 73m, 21m/d, LCX nondom, nl, RCA dom, 81m, PDA/PLA nl.  . Tobacco abuse   . H/O degenerative disc disease   . Myocardial infarction     "I think I had a heart attack in 2010"  . Headache     "frequency depends on the weather" (06/04/2014)  .  Arthritis     "neck" (06/04/2014)  . Chronic lower back pain   . Gastroesophageal reflux disease     H/O   Past Surgical History  Procedure Laterality Date  . Ovarian cyst removal  1980's?  . Tonsillectomy    . Cardiac catheterization  10/15/2008    noncritical mid & distal third anterior descending disease   Allergies  Allergen Reactions  . Penicillins Hives  . Statins     Patient has significant muscle cramps  with statins. She cannot tolerate them.  . Sulfa Antibiotics Hives   Prior to Admission medications   Medication Sig Start Date End Date Taking? Authorizing Provider  ALPRAZolam Duanne Moron) 0.5 MG tablet TAKE 1 TABLET BY MOUTH EVERY 6 HOURS AS NEEDED FOR MUSCLE SPASMS 06/25/14  Yes Darlyne Russian, MD  aspirin EC 81 MG tablet Take 81 mg by mouth daily.   Yes Historical Provider, MD  b complex vitamins capsule Take 1 capsule by mouth at bedtime.    Yes Historical Provider, MD  Calcium Carbonate-Vitamin D (CALCIUM + D PO) Take 1 tablet by mouth at bedtime.    Yes Historical Provider, MD  clonazePAM (KLONOPIN) 1 MG tablet TAKE 1 TABLET AT BEDTIME AS NEEDED FOR ANXIETY 06/25/14  Yes Robyn Haber, MD  ezetimibe (ZETIA) 10 MG tablet Take 1 tablet (10 mg total) by mouth daily. 10/23/13  Yes Darlyne Russian, MD  fish oil-omega-3 fatty acids 1000 MG capsule Take 1 g by mouth at bedtime.    Yes Historical Provider, MD  fluticasone (FLONASE) 50 MCG/ACT nasal spray Place 2 sprays into both nostrils daily. 06/18/14  Yes Bennett Scrape V, PA-C  levocetirizine (XYZAL) 5 MG tablet Take 1 tablet (5 mg total) by mouth every evening. 06/25/14  Yes Bennett Scrape V, PA-C  Lysine 500 MG CAPS Take 500 mg by mouth at bedtime.    Yes Historical Provider, MD  Multiple Vitamins-Minerals (MULTIVITAMIN WITH MINERALS) tablet Take 1 tablet by mouth at bedtime.    Yes Historical Provider, MD  ondansetron (ZOFRAN) 4 MG tablet Take 1 tablet (4 mg total) by mouth every 8 (eight) hours as needed for nausea or vomiting. 06/05/14  Yes Luan Moore, MD  potassium citrate (UROCIT-K) 10 MEQ (1080 MG) SR tablet Take 10 mEq by mouth at bedtime.   Yes Historical Provider, MD  ranitidine (ZANTAC) 150 MG tablet Take 1 tablet (150 mg total) by mouth 2 (two) times daily. 06/25/14  Yes Bennett Scrape V, PA-C  Salicylic Acid 3 % SHAM Apply 1 application topically daily. 06/25/14  Yes Bennett Scrape V, PA-C  sertraline (ZOLOFT) 50 MG tablet Take 1 tablet (50 mg total) by  mouth daily. 06/18/14  Yes Ezekiel Slocumb, PA-C   History   Social History  . Marital Status: Divorced    Spouse Name: N/A  . Number of Children: N/A  . Years of Education: N/A   Occupational History  . Not on file.   Social History Main Topics  . Smoking status: Former Smoker -- 1.00 packs/day for 20 years    Types: Cigarettes    Quit date: 06/21/2014  . Smokeless tobacco: Never Used  . Alcohol Use: 1.8 oz/week    0 Standard drinks or equivalent, 3 Cans of beer per week  . Drug Use: No  . Sexual Activity: No   Other Topics Concern  . Not on file   Social History Narrative   Lives in [place] with her 4 dogs.  She does not routinely exercise  as activity is limited by chronic back pain.    Review of Systems  Constitutional: Positive for fatigue and unexpected weight change. Negative for fever and chills.  Musculoskeletal: Positive for gait problem.  Neurological: Positive for headaches.  Psychiatric/Behavioral: Positive for sleep disturbance.       Objective:   Physical Exam  CONSTITUTIONAL: Well developed/well nourished HEAD: Normocephalic/atraumatic EYES: EOMI/PERRL ENMT: Mucous membranes moist NECK: supple no meningeal signs SPINE/BACK:entire spine nontender CV: S1/S2 noted, no murmurs/rubs/gallops noted LUNGS: Lungs are clear to auscultation bilaterally, no apparent distress ABDOMEN: soft, nontender, no rebound or guarding, bowel sounds noted throughout abdomen GU:no cva tenderness NEURO: Pt is awake/alert/appropriate, moves all extremitiesx4.  No facial droop. There appears to be some mild weakness of left upper and lower extremities. Reflexes on left are 3+ and 4+ on the right. Babinski testing is equivocal. Heel, chin testing on the left shows dysmetria.   EXTREMITIES: pulses normal/equal, full ROM SKIN: warm, color normal PSYCH: no abnormalities of mood noted, alert and oriented to situation  Filed Vitals:   08/14/14 1236  BP: 110/72  Pulse: 91  Temp: 98.9  F (37.2 C)  TempSrc: Oral  Resp: 18  Weight: 123 lb (55.792 kg)  SpO2: 98%        Assessment & Plan:  I am highly suspicious patient had an acute neurologic event when she was hospitalized. Her CT scan shows only microvascular disease and mild atrophy but no evidence of a tumor or obvious other abnormalities. We'll proceed with an MRI the brain with contrast. Neurological referral placed. She has been to see Dr. Collene Mares and has been to the cardiologist for their evaluation. Will also do carotid studies. She states that about 15 years ago she was at [facility] and was told there was some abnormal flow areas to the left side of her brain but she does not know the specifics of this and will try and get more records..I personally performed the services described in this documentation, which was scribed in my presence. The recorded information has been reviewed and is accurate.

## 2014-08-15 ENCOUNTER — Telehealth: Payer: Self-pay | Admitting: Emergency Medicine

## 2014-08-15 NOTE — Telephone Encounter (Signed)
Dr. Everlene Farrier spoke to me concerning this patient's FMLA paperwork. I will call patient to ask her what dates she needs to be covered under FMLA.

## 2014-08-19 ENCOUNTER — Other Ambulatory Visit: Payer: Self-pay | Admitting: Physician Assistant

## 2014-08-19 ENCOUNTER — Ambulatory Visit
Admission: RE | Admit: 2014-08-19 | Discharge: 2014-08-19 | Disposition: A | Discharge status: Home / Self Care | Payer: BC Managed Care – PPO | Source: Ambulatory Visit | Attending: Emergency Medicine | Admitting: Emergency Medicine

## 2014-08-19 DIAGNOSIS — R269 Unspecified abnormalities of gait and mobility: Secondary | ICD-10-CM

## 2014-08-21 ENCOUNTER — Ambulatory Visit (INDEPENDENT_AMBULATORY_CARE_PROVIDER_SITE_OTHER): Payer: BC Managed Care – PPO | Admitting: Gynecology

## 2014-08-21 ENCOUNTER — Other Ambulatory Visit (HOSPITAL_COMMUNITY)
Admission: RE | Admit: 2014-08-21 | Discharge: 2014-08-21 | Disposition: A | Discharge status: Home / Self Care | Payer: BC Managed Care – PPO | Source: Ambulatory Visit | Attending: Gynecology | Admitting: Gynecology

## 2014-08-21 ENCOUNTER — Telehealth: Payer: Self-pay | Admitting: *Deleted

## 2014-08-21 ENCOUNTER — Encounter: Payer: Self-pay | Admitting: Gynecology

## 2014-08-21 VITALS — BP 120/82 | Ht 62.0 in | Wt 121.0 lb

## 2014-08-21 DIAGNOSIS — Z1151 Encounter for screening for human papillomavirus (HPV): Secondary | ICD-10-CM | POA: Insufficient documentation

## 2014-08-21 DIAGNOSIS — N63 Unspecified lump in unspecified breast: Secondary | ICD-10-CM

## 2014-08-21 DIAGNOSIS — Z01419 Encounter for gynecological examination (general) (routine) without abnormal findings: Secondary | ICD-10-CM | POA: Diagnosis not present

## 2014-08-21 DIAGNOSIS — N952 Postmenopausal atrophic vaginitis: Secondary | ICD-10-CM

## 2014-08-21 NOTE — Telephone Encounter (Signed)
-----   Message from Anastasio Auerbach, MD sent at 08/21/2014  9:32 AM EDT ----- Schedule diagnostic mammography and ultrasound reference 3-4 cm mass periphery right breast 12:00 position

## 2014-08-21 NOTE — Progress Notes (Signed)
DAGNY FIORENTINO [birth year] [id number]        59 y.o.  G0P0 for annual exam.  Doing well from a gynecologic standpoint. Being evaluated for headaches and dizziness.  Past medical history,surgical history, problem list, medications, allergies, family history and social history were all reviewed and documented as reviewed in the EPIC chart.  ROS:  Performed with pertinent positives and negatives included in the history, assessment and plan.   Additional significant findings :  Headaches/dizziness being evaluated currently   Exam: Wandra Scot assistant Filed Vitals:   08/21/14 0851  BP: 120/82  Height: 5\' 2"  (1.575 m)  Weight: 121 lb (54.885 kg)   General appearance:  Normal affect, orientation and appearance. Skin: Grossly normal HEENT: Without gross lesions.  No cervical or supraclavicular adenopathy. Thyroid normal.  Lungs:  Clear without wheezing, rales or rhonchi Cardiac: RR, without RMG Abdominal:  Soft, nontender, without masses, guarding, rebound, organomegaly or hernia Breasts:  Examined lying and sitting. Left without masses, retractions, discharge or axillary adenopathy.  Right with 3-4 cm mass 12:00 periphery of breast. No overlying skin changes, nipple discharge or axillary adenopathy. Physical Exam  Pulmonary/Chest:      Pelvic:  Ext/BUS/vagina with atrophic changes  Cervix with atrophic changes  Uterus anteverted, normal size, shape and contour, midline and mobile nontender   Adnexa  Without masses or tenderness    Anus and perineum  Normal   Rectovaginal  Normal sphincter tone without palpated masses or tenderness.    Assessment/Plan:  59 y.o. G0P0 female for annual exam.   1. Breast mass. Unperceived by patient. Need for follow up evaluation stressed. Will schedule diagnostic mammography ultrasound. Differential to include breast cancer and possible biopsy reviewed with patient. Patient knows to call the office if she does not hear within 1  week 2. Postmenopausal/atrophic genital changes. Patient without symptoms of hot flashes, night sweats, vaginal dryness. No vaginal bleeding. Continue to monitor and report any vaginal bleeding. 3. Pap smear 2012.  Pap smear/HPV today. No history of abnormal Pap smears previously. 4. DEXA 2010 normal.  Repeat this coming year at five-year interval. Order placed. Increase calcium vitamin D reviewed. 5. Colonoscopy 2010. Repeat at their recommended interval. 6. Health maintenance. No routine blood work done as she has this done at her primary physician's office. Follow up for the breast studies otherwise annually     Anastasio Auerbach MD, 9:17 AM 08/21/2014

## 2014-08-21 NOTE — Addendum Note (Signed)
Addended by: Burnett Kanaris on: 08/21/2014 09:29 AM   Modules accepted: Orders

## 2014-08-21 NOTE — Telephone Encounter (Signed)
Order placed at breast center they will contact to schedule.

## 2014-08-21 NOTE — Patient Instructions (Signed)
Office will call you to arrange mammogram and ultrasound as far as the breast mass. Call if you do not hear from the office within one week.  You may obtain a copy of any labs that were done today by logging onto MyChart as outlined in the instructions provided with your AVS (after visit summary). The office will not call with normal lab results but certainly if there are any significant abnormalities then we will contact you.   Health Maintenance, Female A healthy lifestyle and preventative care can promote health and wellness.  Maintain regular health, dental, and eye exams.  Eat a healthy diet. Foods like vegetables, fruits, whole grains, low-fat dairy products, and lean protein foods contain the nutrients you need without too many calories. Decrease your intake of foods high in solid fats, added sugars, and salt. Get information about a proper diet from your caregiver, if necessary.  Regular physical exercise is one of the most important things you can do for your health. Most adults should get at least 150 minutes of moderate-intensity exercise (any activity that increases your heart rate and causes you to sweat) each week. In addition, most adults need muscle-strengthening exercises on 2 or more days a week.   Maintain a healthy weight. The body mass index (BMI) is a screening tool to identify possible weight problems. It provides an estimate of body fat based on height and weight. Your caregiver can help determine your BMI, and can help you achieve or maintain a healthy weight. For adults 20 years and older:  A BMI below 18.5 is considered underweight.  A BMI of 18.5 to 24.9 is normal.  A BMI of 25 to 29.9 is considered overweight.  A BMI of 30 and above is considered obese.  Maintain normal blood lipids and cholesterol by exercising and minimizing your intake of saturated fat. Eat a balanced diet with plenty of fruits and vegetables. Blood tests for lipids and cholesterol should begin  at age 38 and be repeated every 5 years. If your lipid or cholesterol levels are high, you are over 50, or you are a high risk for heart disease, you may need your cholesterol levels checked more frequently.Ongoing high lipid and cholesterol levels should be treated with medicines if diet and exercise are not effective.  If you smoke, find out from your caregiver how to quit. If you do not use tobacco, do not start.  Lung cancer screening is recommended for adults aged 58 80 years who are at high risk for developing lung cancer because of a history of smoking. Yearly low-dose computed tomography (CT) is recommended for people who have at least a 30-pack-year history of smoking and are a current smoker or have quit within the past 15 years. A pack year of smoking is smoking an average of 1 pack of cigarettes a day for 1 year (for example: 1 pack a day for 30 years or 2 packs a day for 15 years). Yearly screening should continue until the smoker has stopped smoking for at least 15 years. Yearly screening should also be stopped for people who develop a health problem that would prevent them from having lung cancer treatment.  If you are pregnant, do not drink alcohol. If you are breastfeeding, be very cautious about drinking alcohol. If you are not pregnant and choose to drink alcohol, do not exceed 1 drink per day. One drink is considered to be 12 ounces (355 mL) of beer, 5 ounces (148 mL) of wine, or 1.5  ounces (44 mL) of liquor.  Avoid use of street drugs. Do not share needles with anyone. Ask for help if you need support or instructions about stopping the use of drugs.  High blood pressure causes heart disease and increases the risk of stroke. Blood pressure should be checked at least every 1 to 2 years. Ongoing high blood pressure should be treated with medicines, if weight loss and exercise are not effective.  If you are 16 to 60 years old, ask your caregiver if you should take aspirin to prevent  strokes.  Diabetes screening involves taking a blood sample to check your fasting blood sugar level. This should be done once every 3 years, after age 71, if you are within normal weight and without risk factors for diabetes. Testing should be considered at a younger age or be carried out more frequently if you are overweight and have at least 1 risk factor for diabetes.  Breast cancer screening is essential preventative care for women. You should practice "breast self-awareness." This means understanding the normal appearance and feel of your breasts and may include breast self-examination. Any changes detected, no matter how small, should be reported to a caregiver. Women in their 107s and 30s should have a clinical breast exam (CBE) by a caregiver as part of a regular health exam every 1 to 3 years. After age 62, women should have a CBE every year. Starting at age 18, women should consider having a mammogram (breast X-ray) every year. Women who have a family history of breast cancer should talk to their caregiver about genetic screening. Women at a high risk of breast cancer should talk to their caregiver about having an MRI and a mammogram every year.  Breast cancer gene (BRCA)-related cancer risk assessment is recommended for women who have family members with BRCA-related cancers. BRCA-related cancers include breast, ovarian, tubal, and peritoneal cancers. Having family members with these cancers may be associated with an increased risk for harmful changes (mutations) in the breast cancer genes BRCA1 and BRCA2. Results of the assessment will determine the need for genetic counseling and BRCA1 and BRCA2 testing.  The Pap test is a screening test for cervical cancer. Women should have a Pap test starting at age 50. Between ages 58 and 78, Pap tests should be repeated every 2 years. Beginning at age 28, you should have a Pap test every 3 years as long as the past 3 Pap tests have been normal. If you had a  hysterectomy for a problem that was not cancer or a condition that could lead to cancer, then you no longer need Pap tests. If you are between ages 68 and 41, and you have had normal Pap tests going back 10 years, you no longer need Pap tests. If you have had past treatment for cervical cancer or a condition that could lead to cancer, you need Pap tests and screening for cancer for at least 20 years after your treatment. If Pap tests have been discontinued, risk factors (such as a new sexual partner) need to be reassessed to determine if screening should be resumed. Some women have medical problems that increase the chance of getting cervical cancer. In these cases, your caregiver may recommend more frequent screening and Pap tests.  The human papillomavirus (HPV) test is an additional test that may be used for cervical cancer screening. The HPV test looks for the virus that can cause the cell changes on the cervix. The cells collected during the Pap test  can be tested for HPV. The HPV test could be used to screen women aged 43 years and older, and should be used in women of any age who have unclear Pap test results. After the age of 75, women should have HPV testing at the same frequency as a Pap test.  Colorectal cancer can be detected and often prevented. Most routine colorectal cancer screening begins at the age of 62 and continues through age 17. However, your caregiver may recommend screening at an earlier age if you have risk factors for colon cancer. On a yearly basis, your caregiver may provide home test kits to check for hidden blood in the stool. Use of a small camera at the end of a tube, to directly examine the colon (sigmoidoscopy or colonoscopy), can detect the earliest forms of colorectal cancer. Talk to your caregiver about this at age 67, when routine screening begins. Direct examination of the colon should be repeated every 5 to 10 years through age 30, unless early forms of pre-cancerous  polyps or small growths are found.  Hepatitis C blood testing is recommended for all people born from [birth year] through 1966 and any individual with known risks for hepatitis C.  Practice safe sex. Use condoms and avoid high-risk sexual practices to reduce the spread of sexually transmitted infections (STIs). Sexually active women aged 48 and younger should be checked for Chlamydia, which is a common sexually transmitted infection. Older women with new or multiple partners should also be tested for Chlamydia. Testing for other STIs is recommended if you are sexually active and at increased risk.  Osteoporosis is a disease in which the bones lose minerals and strength with aging. This can result in serious bone fractures. The risk of osteoporosis can be identified using a bone density scan. Women ages 81 and over and women at risk for fractures or osteoporosis should discuss screening with their caregivers. Ask your caregiver whether you should be taking a calcium supplement or vitamin D to reduce the rate of osteoporosis.  Menopause can be associated with physical symptoms and risks. Hormone replacement therapy is available to decrease symptoms and risks. You should talk to your caregiver about whether hormone replacement therapy is right for you.  Use sunscreen. Apply sunscreen liberally and repeatedly throughout the day. You should seek shade when your shadow is shorter than you. Protect yourself by wearing long sleeves, pants, a wide-brimmed hat, and sunglasses year round, whenever you are outdoors.  Notify your caregiver of new moles or changes in moles, especially if there is a change in shape or color. Also notify your caregiver if a mole is larger than the size of a pencil eraser.  Stay current with your immunizations. Document Released: 11/30/2010 Document Revised: 09/11/2012 Document Reviewed: 11/30/2010 [facility] Patient Information 2014 [place].

## 2014-08-22 LAB — URINALYSIS W MICROSCOPIC + REFLEX CULTURE

## 2014-08-22 LAB — CYTOLOGY - PAP

## 2014-08-22 NOTE — Telephone Encounter (Signed)
Appointment 08/27/14 @ 3:20pm

## 2014-08-23 LAB — URINE CULTURE
Colony Count: NO GROWTH
ORGANISM ID, BACTERIA: NO GROWTH

## 2014-08-27 ENCOUNTER — Other Ambulatory Visit: Payer: Self-pay | Admitting: Gynecology

## 2014-08-27 ENCOUNTER — Ambulatory Visit
Admission: RE | Admit: 2014-08-27 | Discharge: 2014-08-27 | Disposition: A | Discharge status: Home / Self Care | Payer: BC Managed Care – PPO | Source: Ambulatory Visit | Attending: Gynecology | Admitting: Gynecology

## 2014-08-27 ENCOUNTER — Ambulatory Visit
Admission: RE | Admit: 2014-08-27 | Discharge: 2014-08-27 | Disposition: A | Discharge status: Home / Self Care | Payer: BC Managed Care – PPO | Source: Ambulatory Visit | Attending: Emergency Medicine | Admitting: Emergency Medicine

## 2014-08-27 DIAGNOSIS — R269 Unspecified abnormalities of gait and mobility: Secondary | ICD-10-CM

## 2014-08-27 DIAGNOSIS — N63 Unspecified lump in unspecified breast: Secondary | ICD-10-CM

## 2014-08-27 DIAGNOSIS — N6310 Unspecified lump in the right breast, unspecified quadrant: Secondary | ICD-10-CM

## 2014-08-27 MED ORDER — GADOBENATE DIMEGLUMINE 529 MG/ML IV SOLN
10.0000 mL | Freq: Once | INTRAVENOUS | Status: AC | PRN
  Administered 2014-08-27: 10 mL via INTRAVENOUS

## 2014-08-30 ENCOUNTER — Ambulatory Visit
Admission: RE | Admit: 2014-08-30 | Discharge: 2014-08-30 | Disposition: A | Discharge status: Home / Self Care | Payer: BC Managed Care – PPO | Source: Ambulatory Visit | Attending: Gynecology | Admitting: Gynecology

## 2014-08-30 ENCOUNTER — Other Ambulatory Visit: Payer: Self-pay | Admitting: Gynecology

## 2014-08-30 DIAGNOSIS — N6310 Unspecified lump in the right breast, unspecified quadrant: Secondary | ICD-10-CM

## 2014-08-30 DIAGNOSIS — C801 Malignant (primary) neoplasm, unspecified: Secondary | ICD-10-CM

## 2014-08-30 HISTORY — DX: Malignant (primary) neoplasm, unspecified: C80.1

## 2014-08-31 ENCOUNTER — Telehealth: Payer: Self-pay | Admitting: Emergency Medicine

## 2014-09-01 NOTE — Telephone Encounter (Signed)
No encounter. Rx. Sent to pharmacy

## 2014-09-02 ENCOUNTER — Telehealth: Payer: Self-pay

## 2014-09-02 ENCOUNTER — Other Ambulatory Visit: Payer: Self-pay | Admitting: Gynecology

## 2014-09-02 DIAGNOSIS — C50911 Malignant neoplasm of unspecified site of right female breast: Secondary | ICD-10-CM

## 2014-09-02 NOTE — Telephone Encounter (Signed)
Per [facility] patient is scheduled for MRI of breast 64403 on 09/04/14 and needs prior authorization. I called and spoke with Lovey Newcomer at [facility] and received order ID #[id number] valid 4/6-10/01/14.  Added info to MRI appt notes.

## 2014-09-03 ENCOUNTER — Telehealth: Payer: Self-pay | Admitting: *Deleted

## 2014-09-03 DIAGNOSIS — C50211 Malignant neoplasm of upper-inner quadrant of right female breast: Secondary | ICD-10-CM | POA: Insufficient documentation

## 2014-09-03 NOTE — Telephone Encounter (Signed)
Left message for a return phone call to schedule for [facility] 09/11/14.  Awaiting patient response.

## 2014-09-03 NOTE — Telephone Encounter (Signed)
Received call back from patient. Confirmed [facility] for 09/11/14 at 12N .  Instructions and contact information given.

## 2014-09-04 ENCOUNTER — Ambulatory Visit
Admission: RE | Admit: 2014-09-04 | Discharge: 2014-09-04 | Disposition: A | Discharge status: Home / Self Care | Payer: BC Managed Care – PPO | Source: Ambulatory Visit | Attending: Gynecology | Admitting: Gynecology

## 2014-09-04 DIAGNOSIS — C50911 Malignant neoplasm of unspecified site of right female breast: Secondary | ICD-10-CM

## 2014-09-04 MED ORDER — GADOBENATE DIMEGLUMINE 529 MG/ML IV SOLN: 11.0000 mL | Freq: Once | INTRAVENOUS | Status: AC | PRN

## 2014-09-11 ENCOUNTER — Ambulatory Visit: Payer: BC Managed Care – PPO

## 2014-09-11 ENCOUNTER — Ambulatory Visit: Payer: BC Managed Care – PPO | Attending: General Surgery | Admitting: Physical Therapy

## 2014-09-11 ENCOUNTER — Ambulatory Visit (HOSPITAL_BASED_OUTPATIENT_CLINIC_OR_DEPARTMENT_OTHER): Payer: BC Managed Care – PPO | Admitting: Oncology

## 2014-09-11 ENCOUNTER — Encounter: Payer: Self-pay | Admitting: Physical Therapy

## 2014-09-11 ENCOUNTER — Other Ambulatory Visit (HOSPITAL_BASED_OUTPATIENT_CLINIC_OR_DEPARTMENT_OTHER): Payer: BC Managed Care – PPO

## 2014-09-11 ENCOUNTER — Encounter: Payer: Self-pay | Admitting: Oncology

## 2014-09-11 ENCOUNTER — Other Ambulatory Visit: Payer: Self-pay | Admitting: General Surgery

## 2014-09-11 ENCOUNTER — Ambulatory Visit
Admission: RE | Admit: 2014-09-11 | Discharge: 2014-09-11 | Disposition: A | Discharge status: Home / Self Care | Payer: BC Managed Care – PPO | Source: Ambulatory Visit | Attending: Radiation Oncology | Admitting: Radiation Oncology

## 2014-09-11 ENCOUNTER — Telehealth: Payer: Self-pay | Admitting: *Deleted

## 2014-09-11 VITALS — BP 101/75 | HR 102 | Temp 98.1°F | Resp 18 | Ht 62.0 in | Wt 125.6 lb

## 2014-09-11 DIAGNOSIS — C50811 Malignant neoplasm of overlapping sites of right female breast: Secondary | ICD-10-CM | POA: Diagnosis not present

## 2014-09-11 DIAGNOSIS — C50211 Malignant neoplasm of upper-inner quadrant of right female breast: Secondary | ICD-10-CM

## 2014-09-11 DIAGNOSIS — F329 Major depressive disorder, single episode, unspecified: Secondary | ICD-10-CM

## 2014-09-11 DIAGNOSIS — R293 Abnormal posture: Secondary | ICD-10-CM | POA: Diagnosis not present

## 2014-09-11 DIAGNOSIS — M5440 Lumbago with sciatica, unspecified side: Secondary | ICD-10-CM | POA: Diagnosis not present

## 2014-09-11 DIAGNOSIS — Z720 Tobacco use: Secondary | ICD-10-CM

## 2014-09-11 DIAGNOSIS — R0789 Other chest pain: Secondary | ICD-10-CM

## 2014-09-11 DIAGNOSIS — R269 Unspecified abnormalities of gait and mobility: Secondary | ICD-10-CM

## 2014-09-11 DIAGNOSIS — F32A Depression, unspecified: Secondary | ICD-10-CM

## 2014-09-11 LAB — CBC WITH DIFFERENTIAL/PLATELET
BASO%: 0.6 % (ref 0.0–2.0)
BASOS ABS: 0 10*3/uL (ref 0.0–0.1)
EOS%: 0.9 % (ref 0.0–7.0)
Eosinophils Absolute: 0.1 10*3/uL (ref 0.0–0.5)
HCT: 40.2 % (ref 34.8–46.6)
HEMOGLOBIN: 14 g/dL (ref 11.6–15.9)
LYMPH%: 22.4 % (ref 14.0–49.7)
MCH: 33.7 pg (ref 25.1–34.0)
MCHC: 34.8 g/dL (ref 31.5–36.0)
MCV: 96.6 fL (ref 79.5–101.0)
MONO#: 0.5 10*3/uL (ref 0.1–0.9)
MONO%: 7.7 % (ref 0.0–14.0)
NEUT#: 4.7 10*3/uL (ref 1.5–6.5)
NEUT%: 68.4 % (ref 38.4–76.8)
Platelets: 217 10*3/uL (ref 145–400)
RBC: 4.16 10*6/uL (ref 3.70–5.45)
RDW: 12.8 % (ref 11.2–14.5)
WBC: 6.9 10*3/uL (ref 3.9–10.3)
lymph#: 1.5 10*3/uL (ref 0.9–3.3)

## 2014-09-11 LAB — COMPREHENSIVE METABOLIC PANEL (CC13)
ALBUMIN: 4 g/dL (ref 3.5–5.0)
ALT: 17 U/L (ref 0–55)
AST: 25 U/L (ref 5–34)
Alkaline Phosphatase: 79 U/L (ref 40–150)
Anion Gap: 9 mEq/L (ref 3–11)
BILIRUBIN TOTAL: 0.29 mg/dL (ref 0.20–1.20)
BUN: 9.6 mg/dL (ref 7.0–26.0)
CO2: 27 mEq/L (ref 22–29)
Calcium: 9.1 mg/dL (ref 8.4–10.4)
Chloride: 103 mEq/L (ref 98–109)
Creatinine: 0.7 mg/dL (ref 0.6–1.1)
GLUCOSE: 113 mg/dL (ref 70–140)
Potassium: 4.1 mEq/L (ref 3.5–5.1)
SODIUM: 139 meq/L (ref 136–145)
TOTAL PROTEIN: 7.1 g/dL (ref 6.4–8.3)

## 2014-09-11 NOTE — Progress Notes (Signed)
Checked in new pt with no financial concerns prior to seeing the dr. Informed pt if chemo is part of her treatment we will call her ins to see if Josem Kaufmann is req and will obtain it if it is as well as contact foundations that offer copay assistance for chemo if needed. She has my card for any billing questions or concerns.

## 2014-09-11 NOTE — Therapy (Signed)
[place], [place], [address] Phone: [phone]   Fax:  [phone]  Physical Therapy Evaluation  Patient Details  Name: Beth Rice MRN: [id number] Date of Birth: 07/23/1955 Referring Provider:  [name], MD  Encounter Date: 09/11/2014      PT End of Session - 09/11/14 1559    Visit Number 1   Number of Visits 1   PT Start Time 1638   PT Stop Time 4536  Saw pt briefly at 1420   PT Time Calculation (min) 32 min   Activity Tolerance Patient tolerated treatment well   Behavior During Therapy [facility] for tasks assessed/performed      Past Medical History  Diagnosis Date  . Anxiety   . Elevated cholesterol   . Depression   . Chest pain     a. 09/2008 Cath: ER 60%, LM nl, LAD 19m 46m, LCX nondom, nl, RCA dom, 2025mDA/PLA nl.  . Tobacco abuse   . H/O degenerative disc disease   . Myocardial infarction     "I think I had a heart attack in 2010"  . Headache     "frequency depends on the weather" (06/04/2014)  . Arthritis     "neck" (06/04/2014)  . Chronic lower back pain   . Gastroesophageal reflux disease     H/O  . Hot flashes     Past Surgical History  Procedure Laterality Date  . Ovarian cyst removal  1980's?  . Tonsillectomy    . Cardiac catheterization  10/15/2008    noncritical mid & distal third anterior descending disease    There were no vitals filed for this visit.  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   Patient Stated Goals Reduce lymphedema risk and learn post op shoulder ROM HEP   Currently in Pain? Yes   Pain Score 7    Pain Location Knee   Pain Orientation Left   Pain Descriptors / Indicators Sharp   Pain Type Acute pain   Pain Onset In the past 7 days   Pain Frequency Intermittent   Aggravating Factors  walking   Pain Relieving Factors ice and rest   Effect of Pain on Daily Activities She reports she twisted her left knee a few days ago while walking her dog.            [facility] - 09/11/14 0001    Assessment   Medical Diagnosis Right upper inner breast cancer   Onset Date 09/04/14   Precautions   Precautions Other (comment)  Active breast cancer   Restrictions   Weight Bearing Restrictions No   Balance Screen   Has the patient fallen in the past 6 months Yes  She fell over her dog 2 days ago   How many times? 1   Has the patient had a decrease in activity level because of a fear of falling?  No  Is the patient reluctant to leave their home because of a fear of falling?  No   Home Environment   Living Enviornment Private residence   Living Arrangements Alone   Prior Function   Level of Independence Independent with basic ADLs   Vocation Retired   Leisure She does not exercise   Cognition   Overall Cognitive Status Within Functional Limits for tasks assessed   Posture/Postural Control   Posture/Postural Control Postural limitations   Postural Limitations Rounded Shoulders;Forward head   ROM / Strength   AROM / PROM / Strength AROM;Strength   AROM   AROM Assessment Site Shoulder   Right/Left Shoulder Right;Left   Right Shoulder Extension 62 Degrees   Right Shoulder Flexion 120 Degrees   Right Shoulder ABduction 144 Degrees   Right Shoulder Internal Rotation 80 Degrees   Right Shoulder External Rotation 82 Degrees   Left Shoulder Extension 64 Degrees   Left Shoulder Flexion 140 Degrees   Left Shoulder  ABduction 136 Degrees   Left Shoulder Internal Rotation 75 Degrees   Left Shoulder External Rotation 75 Degrees   Strength   Overall Strength Within functional limits for tasks performed           LYMPHEDEMA/ONCOLOGY QUESTIONNAIRE - 09/11/14 1556    Type   Cancer Type Right breast cancer   Lymphedema Assessments   Lymphedema Assessments Upper extremities   Right Upper Extremity Lymphedema   10 cm Proximal to Olecranon Process 24.8 cm   Olecranon Process 22.4 cm   10 cm Proximal to Ulnar Styloid Process 19.9 cm   Just Proximal to Ulnar Styloid Process 14.8 cm   Across Hand at [employer] 18 cm   At [place] of 2nd Digit 6.5 cm   Left Upper Extremity Lymphedema   10 cm Proximal to Olecranon Process 25.9 cm   Olecranon Process 22.5 cm   10 cm Proximal to Ulnar Styloid Process 19.8 cm   Just Proximal to Ulnar Styloid Process 14.5 cm   Across Hand at [employer] 17.5 cm   At [place] of 2nd Digit 6.2 cm      Patient was instructed today in a home exercise program today for post op shoulder range of motion. These included active assist shoulder flexion in sitting, scapular retraction, wall walking with shoulder abduction, and hands behind head external rotation.  She was encouraged to do these twice a day, holding 3 seconds and repeating 5 times when permitted by her physician.         PT Education - 09/11/14 1558    Education provided Yes   Education Details Post op shoulder ROM HEP and lymphedema risk reduction   Person(s) Educated Patient   Methods Explanation;Demonstration;Handout   Comprehension Returned demonstration;Verbalized understanding              Breast Clinic Goals - 09/11/14 1602    Patient will be able to verbalize understanding of pertinent lymphedema risk reduction practices relevant to her diagnosis specifically related to skin care.   Time 1   Period Days   Status Achieved   Patient will be able to return demonstrate and/or verbalize  understanding of the post-op home exercise program related to regaining shoulder range of motion.   Time 1   Period Days   Status Achieved   Patient will be able to verbalize understanding of the importance of attending the postoperative After Breast Cancer Class for further lymphedema risk reduction education and therapeutic exercise.   Time 1  Period Days   Status Achieved              Plan - 09/11/14 1600    Clinical Impression Statement Patient was recently diagnosed with right ER positive, PR negative, HER2 positive breast cancer with a mass measuring 2.3 cm and a Ki67 of 81%.  She is planning to undergo neoadjuvant chemotherapy followed by a right lumpectomy and sentinel node biopsy and radiation.  She will likely benefit from post op PT to regain shoulder ROM and strength and prevent lymphedema.   Pt will benefit from skilled therapeutic intervention in order to improve on the following deficits Decreased range of motion;Increased edema;Decreased knowledge of precautions;Impaired UE functional use;Pain;Decreased strength   Rehab Potential Good   Clinical Impairments Affecting Rehab Potential none   PT Frequency One time visit   PT Treatment/Interventions Patient/family education;Therapeutic exercise   Consulted and Agree with Plan of Care Patient     Patient will follow up at outpatient cancer rehab if needed following surgery.  If the patient requires physical therapy at that time, a specific plan will be dictated and sent to the referring physician for approval. The patient was educated today on appropriate basic range of motion exercises to begin post operatively and the importance of attending the After Breast Cancer class following surgery.  Patient was educated today on lymphedema risk reduction practices as it pertains to recommendations that will benefit the patient immediately following surgery.  She verbalized good understanding.  No additional physical therapy is  indicated at this time.       Problem List Patient Active Problem List   Diagnosis Date Noted  . Breast cancer of upper-inner quadrant of right female breast 09/03/2014  . Abnormality of gait 08/14/2014  . Tobacco abuse 06/25/2014  . Chest pain 06/03/2014  . Lumbar pain 10/25/2013  . Other and unspecified hyperlipidemia 10/02/2012  . Depression 10/02/2012    Annia Friendly, PT 09/11/2014, 4:04 PM  [place], [place], [address] Phone: [phone]   Fax:  [phone]

## 2014-09-11 NOTE — Progress Notes (Signed)
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 Breast cancer of upper-inner quadrant of right female breast 174.2 C50.211     HISTORY OF PRESENT ILLNESS::Beth Rice is a 59 y.o. female who presented with a palpable right breast mass in the upper inner quadrant.  This was 2.5 cm on mammogram, 1.8 cm on [place]. MRI showed a 2.3 cm mass close to the pectoralis without invasion of the muscle.  No clinically positive nodes. No prior cancers. Quit smoking in January.  Biopsy of breast mass revealed Invasive Ductal Carcinoma, ER2% / PR0% / Her2+, Grade 1-2   PREVIOUS RADIATION THERAPY: No  PAST MEDICAL HISTORY:  has a past medical history of Anxiety; Elevated cholesterol; Depression; Chest pain; Tobacco abuse; H/O degenerative disc disease; Myocardial infarction; Headache; Arthritis; Chronic lower back pain; Gastroesophageal reflux disease; and Hot flashes.    PAST SURGICAL HISTORY: Past Surgical History  Procedure Laterality Date  . Ovarian cyst removal  1980's?  . Tonsillectomy    . Cardiac catheterization  10/15/2008    noncritical mid & distal third anterior descending disease    FAMILY HISTORY: family history includes Alzheimer's disease in her mother; Cerebral aneurysm in her father; Heart disease in her mother; Hypertension in her sister; Mental illness in her father.  SOCIAL HISTORY:  reports that she quit smoking about 2 months ago. Her smoking use included Cigarettes. She has a 20 pack-year smoking history. She has never used smokeless tobacco. She reports that she drinks about 1.8 oz of alcohol per week. She reports that she does not  use illicit drugs.  ALLERGIES: Penicillins; Statins; and Sulfa antibiotics  MEDICATIONS:  Current Outpatient Prescriptions  Medication Sig Dispense Refill  . ALPRAZolam (XANAX) 0.5 MG tablet TAKE 1 TABLET BY MOUTH EVERY 6 HOURS AS NEEDED FOR MUSCLE SPASMS 30 tablet 2  . aspirin EC 81 MG tablet Take 81 mg by mouth daily.    Marland Kitchen b complex vitamins capsule Take 1 capsule by mouth at bedtime.     . Calcium Carbonate-Vitamin D (CALCIUM + D PO) Take 1 tablet by mouth at bedtime.     . clonazePAM (KLONOPIN) 1 MG tablet TAKE 1 TABLET AT BEDTIME AS NEEDED FOR ANXIETY 90 tablet 0  . ezetimibe (ZETIA) 10 MG tablet Take 1 tablet (10 mg total) by mouth daily. 30 tablet 11  . fish oil-omega-3 fatty acids 1000 MG capsule Take 1 g by mouth at bedtime.     . fluticasone (FLONASE) 50 MCG/ACT nasal spray Place 2 sprays into both nostrils daily. 16 g 11  . levocetirizine (XYZAL) 5 MG tablet TAKE 1 TABLET (5 MG TOTAL) BY MOUTH EVERY EVENING. 30 tablet 3  . Lysine 500 MG CAPS Take 500 mg by mouth at bedtime.     . Multiple Vitamins-Minerals (MULTIVITAMIN WITH MINERALS) tablet Take 1 tablet by mouth at bedtime.     . pantoprazole (PROTONIX) 20 MG tablet Take 1 tablet by mouth daily.    . potassium citrate (UROCIT-K) 10 MEQ (1080 MG) SR tablet Take 10 mEq by mouth at bedtime.    . ranitidine (ZANTAC) 150 MG tablet TAKE 1 TABLET (  150 MG TOTAL) BY MOUTH 2 (TWO) TIMES DAILY. 60 tablet 3  . Salicylic Acid 3 % SHAM Apply 1 application topically daily. 177 mL 0  . sertraline (ZOLOFT) 50 MG tablet Take 1 tablet (50 mg total) by mouth daily. 30 tablet 11   No current facility-administered medications for this encounter.    REVIEW OF SYSTEMS:  Notable for that above.   PHYSICAL EXAM:   Vitals with BMI 09/11/2014  Height _0   Weight 125 lbs 10 oz  BMI 23  Systolic 211  Diastolic 75  Pulse 941  Respirations 18   General: Alert and oriented, in no acute distress HEENT: Head is normocephalic. Extraocular movements  are intact. Oropharynx is clear. Neck: Neck is supple, no palpable cervical or supraclavicular lymphadenopathy. Heart: Regular in rate and rhythm with no murmurs, rubs, or gallops. Chest: Clear to auscultation bilaterally, with no rhonchi, wheezes, or rales. Abdomen: Soft, nontender, nondistended, with no rigidity or guarding. Extremities: No cyanosis or edema. Lymphatics: see Neck Exam Skin: No concerning lesions. Musculoskeletal: symmetric strength and muscle tone throughout. Neurologic: Cranial nerves II through XII are grossly intact. No obvious focalities. Speech is fluent. Coordination is intact. Psychiatric: Judgment and insight are intact. Affect is appropriate. Breasts: 3cm UIQ right breast mass; no other breast masses appreciated nor axillary adenopathy bilaterally.  ECOG = 0  0 - Asymptomatic (Fully active, able to carry on all predisease activities without restriction)  1 - Symptomatic but completely ambulatory (Restricted in physically strenuous activity but ambulatory and able to carry out work of a light or sedentary nature. For example, light housework, office work)  2 - Symptomatic, <50% in bed during the day (Ambulatory and capable of all self care but unable to carry out any work activities. Up and about more than 50% of waking hours)  3 - Symptomatic, >50% in bed, but not bedbound (Capable of only limited self-care, confined to bed or chair 50% or more of waking hours)  4 - Bedbound (Completely disabled. Cannot carry on any self-care. Totally confined to bed or chair)  5 - Death   Eustace Pen MM, Creech RH, Tormey DC, et al. [phone]). "Toxicity and response criteria of the [facility]". Bruce Oncol. 5 (6): 649-55   LABORATORY DATA:  Lab Results  Component Value Date   WBC 6.9 09/11/2014   HGB 14.0 09/11/2014   HCT 40.2 09/11/2014   MCV 96.6 09/11/2014   PLT 217 09/11/2014   CMP     Component Value Date/Time   NA 139 09/11/2014 1130    NA 139 06/18/2014 1133   K 4.1 09/11/2014 1130   K 4.2 06/18/2014 1133   CL 103 06/18/2014 1133   CO2 27 09/11/2014 1130   CO2 27 06/18/2014 1133   GLUCOSE 113 09/11/2014 1130   GLUCOSE 93 06/18/2014 1133   BUN 9.6 09/11/2014 1130   BUN 7 06/18/2014 1133   CREATININE 0.7 09/11/2014 1130   CREATININE 0.73 06/18/2014 1133   CREATININE 0.64 06/03/2014 1203   CALCIUM 9.1 09/11/2014 1130   CALCIUM 9.3 06/18/2014 1133   PROT 7.1 09/11/2014 1130   PROT 6.8 10/23/2013 1030   ALBUMIN 4.0 09/11/2014 1130   ALBUMIN 4.5 10/23/2013 1030   AST 25 09/11/2014 1130   AST 25 10/23/2013 1030   ALT 17 09/11/2014 1130   ALT 16 10/23/2013 1030   ALKPHOS 79 09/11/2014 1130   ALKPHOS 68 10/23/2013 1030   BILITOT 0.29 09/11/2014 1130   BILITOT 0.7  10/23/2013 1030   GFRNONAA >90 06/03/2014 1203   GFRNONAA >89 10/23/2013 1030   GFRAA >90 06/03/2014 1203   GFRAA >89 10/23/2013 1030         RADIOGRAPHY: Mr Jeri Cos Wo Contrast  08/27/2014   CLINICAL DATA:  Gait abnormality.  BUN and creatinine were obtained on site at [place] at  [address]  Results:  BUN 6 mg/dL,  Creatinine 0.7 mg/dL.  EXAM: MRI HEAD WITHOUT AND WITH CONTRAST  TECHNIQUE: Multiplanar, multiecho pulse sequences of the brain and surrounding structures were obtained without and with intravenous contrast.  CONTRAST:  57m MULTIHANCE GADOBENATE DIMEGLUMINE 529 MG/ML IV SOLN  COMPARISON:  CT head 07/02/2014  FINDINGS: Mild atrophy. Ventricles are mildly prominent consistent with atrophy.  Negative for acute infarct. Mild chronic microvascular ischemic change in the white matter and pons bilaterally. No cortical infarct. Cerebellum normal in signal and volume.  Negative for intracranial hemorrhage.  Negative for mass or edema.  Normal enhancement following contrast administration  Paranasal sinuses are clear.  Normal orbit bilaterally.  IMPRESSION: Atrophy and chronic microvascular ischemic change. No acute abnormality.    Electronically Signed   By: CFranchot GalloM.D.   On: 08/27/2014 19:45   Mr Breast Bilateral W Wo Contrast  09/04/2014   CLINICAL DATA:  59year old female with recently diagnosed invasive ductal carcinoma of the right breast following ultrasound-guided biopsy of a palpable mass at the 12:30 position, with the mass and calcifications measuring 2.5 cm mammographically.  LABS:  Not applicable.  EXAM: BILATERAL BREAST MRI WITH AND WITHOUT CONTRAST  TECHNIQUE: Multiplanar, multisequence MR images of both breasts were obtained prior to and following the intravenous administration of 11 ml of MultiHance.  THREE-DIMENSIONAL MR IMAGE RENDERING ON INDEPENDENT WORKSTATION:  Three-dimensional MR images were rendered by post-processing of the original MR data on an independent workstation. The three-dimensional MR images were interpreted, and findings are reported in the following complete MRI report for this study. Three dimensional images were evaluated at the independent DynaCad workstation  COMPARISON:  Previous exam(s).  FINDINGS: Breast composition: c.  Heterogeneous fibroglandular tissue.  Background parenchymal enhancement: Moderate.  Right breast: Spiculated mass containing biopsy clip artifact in the superior right breast posterior depth at the approximate 12:30 position measures 2.3 cm AP, 2.3 cm transverse, and 2.1 cm craniocaudal. The mass comes into close proximity with the adjacent pectoralis muscle, however there are no findings to suggest muscular invasion. No additional suspicious areas of enhancement or masses seen in the right breast to suggest additional sites of disease.  Left breast: No suspicious rapidly enhancing masses or abnormal areas of enhancement seen in the left breast to suggest malignancy.  Lymph nodes: No morphologically abnormal axillary lymph nodes. No internal mammary lymphadenopathy.  Ancillary findings:  None.  IMPRESSION: 1. Biopsy proven malignancy in the superior posterior right  breast at the approximate 12:30 position measures up to 2.3 cm, and corresponds to the mass and associated calcifications seen mammographically spanning approximately 2.5 cm. No findings to suggest additional sites of disease in the right breast.  2.  No MRI evidence of malignancy in the left breast.  RECOMMENDATION: Treatment plan for known right breast cancer.  BI-RADS CATEGORY  6: Known biopsy-proven malignancy.   Electronically Signed   By: JEverlean AlstromM.D.   On: 09/04/2014 10:46   [place]otid Duplex Bilateral  08/19/2014   CLINICAL DATA:  59year old female with a history of gait abnormality.  Cardiovascular risk factors include hyperlipidemia and  former smoking.  EXAM: BILATERAL CAROTID DUPLEX ULTRASOUND  TECHNIQUE: Pearline Cables scale imaging, color Doppler and duplex ultrasound were performed of bilateral carotid and vertebral arteries in the neck.  COMPARISON:  None  FINDINGS: Criteria: Quantification of carotid stenosis is based on velocity parameters that correlate the residual internal carotid diameter with NASCET-based stenosis levels, using the diameter of the distal internal carotid lumen as the denominator for stenosis measurement.  The following velocity measurements were obtained:  RIGHT  ICA:  Systolic 883 cm/sec, Diastolic 43 cm/sec  CCA:  81 cm/sec  SYSTOLIC ICA/CCA RATIO:  1.2  ECA:  76 cm/sec  LEFT  ICA:  Systolic 63 cm/sec, Diastolic 29 cm/sec  CCA:  75 cm/sec  SYSTOLIC ICA/CCA RATIO:  0.8  ECA:  69 cm/sec  Right Brachial SBP: 254 systolic  Left Brachial SBP: 99 systolic  RIGHT CAROTID ARTERY: No significant atherosclerotic disease of the right common carotid artery. Intermediate waveform maintained. Low resistance waveform of the right ICA. Mild heterogeneous plaque at the right carotid bifurcation.  RIGHT VERTEBRAL ARTERY: Antegrade flow with low resistance waveform.  LEFT CAROTID ARTERY: No significant atherosclerotic disease of the left common carotid artery. Intermediate waveform  maintained. Mild heterogeneous plaque at the left carotid bifurcation. Low resistance waveform of the left ICA.  LEFT VERTEBRAL ARTERY:  Antegrade flow with low resistance waveform.  IMPRESSION: Color duplex indicates minimal heterogeneous plaque, with no hemodynamically significant stenosis by duplex criteria in the extracranial cerebrovascular circulation.  Signed,  Dulcy Fanny. Earleen Newport, DO  [facility]  [facility]   Electronically Signed   By: Corrie Mckusick D.O.   On: 08/19/2014 15:38   Mm Digital Diagnostic Unilat R  08/30/2014   CLINICAL DATA:  Post biopsy clip mammograms following ultrasound-guided core needle biopsy of the right breast.  EXAM: DIAGNOSTIC RIGHT MAMMOGRAM POST ULTRASOUND BIOPSY  COMPARISON:  Previous exam(s).  FINDINGS: Mammographic images were obtained following ultrasound guided biopsy of a 12 o'clock position irregular hypoechoic right breast mass. The coil shaped biopsy clip projects within the mass.  IMPRESSION: Well-positioned coil shaped biopsy clip following ultrasound-guided core needle biopsy of a suspicious right breast mass.  Final Assessment: Post Procedure Mammograms for Marker Placement   Electronically Signed   By: Lajean Manes M.D.   On: 08/30/2014 11:59   US Breast Ltd Uni Right Inc Axilla  08/27/2014   CLINICAL DATA:  Patient has developed a palpable right breast mass in the past month.  EXAM: DIGITAL DIAGNOSTIC right breast MAMMOGRAM WITH 3D TOMOSYNTHESIS WITH CAD  ULTRASOUND right BREAST  COMPARISON:  10/04/2013, 10/03/2012, 09/15/2010.  ACR Breast Density Category c: The breast tissue is heterogeneously dense, which may obscure small masses.  FINDINGS: The patient has developed a spiculated mass located superiorly within the right breast at the 12:30 o'clock position with central pleomorphic calcifications extending anteriorly from the mass. The mass and calcifications extending anteriorly span 2.5 cm. The right breast is  otherwise unchanged. This does correspond to the palpable finding on physical examination.  Mammographic images were processed with CAD.  On physical exam, there is a firm, mobile palpable mass located superiorly within the right breast at 12:30 o'clock position 8 cm from the nipple. By physical examination this measures approximately 2 cm in size. There is no palpable right axillary adenopathy.  Targeted ultrasound is performed, showing an irregularly marginated hypoechoic mass with shadowing located within the right breast at 12:30 o'clock position 8 cm from the nipple measuring 1.8 x 1.7 x 1.7  cm in size. This is suspicious for invasive mammary carcinoma. I have discussed ultrasound-guided core biopsy of the mass with the patient. The patient is currently on daily aspirin. She plans to discontinue this at this time. Core biopsy will be scheduled.  Ultrasound of the right axilla demonstrates normal axillary contents and no evidence for adenopathy.  IMPRESSION: 1.8 cm suspicious mass located within the right breast at 12:30 o'clock position 8 cm from the nipple. There are calcifications which extend anteriorly from the mass. The mass with calcifications spans 2.5 cm. Tissue sampling is recommended and ultrasound-guided core biopsy will be scheduled.  RECOMMENDATION: Right breast ultrasound-guided core biopsy.  I have discussed the findings and recommendations with the patient. Results were also provided in writing at the conclusion of the visit. If applicable, a reminder letter will be sent to the patient regarding the next appointment.  BI-RADS CATEGORY  5: Highly suggestive of malignancy.   Electronically Signed   By: Altamese Cabal M.D.   On: 08/27/2014 13:41   Mm Diag Breast Tomo Uni Left  09/04/2014   CLINICAL DATA:  59 year old female with recently diagnosed invasive carcinoma of the right breast. Patient presents for pre MRI mammography of the left breast.  EXAM: DIGITAL DIAGNOSTIC LEFT MAMMOGRAM WITH  3D TOMOSYNTHESIS AND CAD  COMPARISON:  Previous exams.  ACR Breast Density Category c: The breast tissue is heterogeneously dense, which may obscure small masses.  FINDINGS: No suspicious masses or calcifications are seen in the left breast. There is no mammographic evidence of malignancy in the left breast.  Mammographic images were processed with CAD.  IMPRESSION: No mammographic evidence of malignancy in the left breast.  RECOMMENDATION: Treatment plan for known right breast malignancy.  I have discussed the findings and recommendations with the patient. Results were also provided in writing at the conclusion of the visit. If applicable, a reminder letter will be sent to the patient regarding the next appointment.  BI-RADS CATEGORY  1: Negative.   Electronically Signed   By: Everlean Alstrom M.D.   On: 09/04/2014 11:14   Mm Diag Breast Tomo Uni Right  08/27/2014   CLINICAL DATA:  Patient has developed a palpable right breast mass in the past month.  EXAM: DIGITAL DIAGNOSTIC right breast MAMMOGRAM WITH 3D TOMOSYNTHESIS WITH CAD  ULTRASOUND right BREAST  COMPARISON:  10/04/2013, 10/03/2012, 09/15/2010.  ACR Breast Density Category c: The breast tissue is heterogeneously dense, which may obscure small masses.  FINDINGS: The patient has developed a spiculated mass located superiorly within the right breast at the 12:30 o'clock position with central pleomorphic calcifications extending anteriorly from the mass. The mass and calcifications extending anteriorly span 2.5 cm. The right breast is otherwise unchanged. This does correspond to the palpable finding on physical examination.  Mammographic images were processed with CAD.  On physical exam, there is a firm, mobile palpable mass located superiorly within the right breast at 12:30 o'clock position 8 cm from the nipple. By physical examination this measures approximately 2 cm in size. There is no palpable right axillary adenopathy.  Targeted ultrasound is  performed, showing an irregularly marginated hypoechoic mass with shadowing located within the right breast at 12:30 o'clock position 8 cm from the nipple measuring 1.8 x 1.7 x 1.7 cm in size. This is suspicious for invasive mammary carcinoma. I have discussed ultrasound-guided core biopsy of the mass with the patient. The patient is currently on daily aspirin. She plans to discontinue this at this time. Core biopsy will be  scheduled.  Ultrasound of the right axilla demonstrates normal axillary contents and no evidence for adenopathy.  IMPRESSION: 1.8 cm suspicious mass located within the right breast at 12:30 o'clock position 8 cm from the nipple. There are calcifications which extend anteriorly from the mass. The mass with calcifications spans 2.5 cm. Tissue sampling is recommended and ultrasound-guided core biopsy will be scheduled.  RECOMMENDATION: Right breast ultrasound-guided core biopsy.  I have discussed the findings and recommendations with the patient. Results were also provided in writing at the conclusion of the visit. If applicable, a reminder letter will be sent to the patient regarding the next appointment.  BI-RADS CATEGORY  5: Highly suggestive of malignancy.   Electronically Signed   By: Altamese Cabal M.D.   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 This was found to be concordant by Dr. Lajean Manes. Pathology was discussed with the patient by telephone. She reported doing well after the biopsy. Post biopsy instructions and care were reviewed and her questions were answered. She has been [facility] at [facility] on September 11, 2014. A bilateral breast MRI has been scheduled on September 04, 2014. She is encouraged to come to [facility] for educational materials. My number is provided for future questions and concerns.  Pathology results reported by Drema Pry, BSN on September 02, 2014.   Electronically Signed   By: Lajean Manes M.D.   On: 09/02/2014 14:25   09/02/2014   CLINICAL DATA:  Patient presents for ultrasound-guided core needle biopsy of a suspicious, 1.8 cm, upper posterior right breast mass.  EXAM: ULTRASOUND GUIDED RIGHT BREAST CORE NEEDLE BIOPSY  COMPARISON:  Previous exam(s).  PROCEDURE: I met with the patient and we discussed the procedure of ultrasound-guided biopsy, including benefits and alternatives. We discussed the high likelihood of a successful procedure. We discussed the risks of the procedure including infection, bleeding, tissue injury, clip migration, and inadequate sampling. Informed written consent was given. The usual time-out protocol was performed immediately prior to the procedure.  Using sterile technique and 2% Lidocaine as local anesthetic, under direct ultrasound visualization, a 12 gauge vacuum-assisted device was used to perform biopsy of the hypoechoic irregular 18 mm massusing a lateral approach. At the conclusion of the procedure, a coil shaped tissue marker clip was deployed into the biopsy cavity. Follow-up 2-view mammogram was performed and dictated separately.  IMPRESSION: Ultrasound-guided biopsy of a highly suspicious right breast mass. No apparent complications.  Electronically Signed: By: Lajean Manes M.D. On: 08/30/2014 11:58      IMPRESSION/PLAN:  Lovely 59 yo woman with T2N0M0 right breast cancer. Anticipate neoadjuvant chemotherapy followed by breast conservation surgery.  She will then benefit from radiotherapy to the right breast.  It was a pleasure meeting the patient today. We discussed the risks, benefits, and side effects of radiotherapy. We discussed that radiation would take approximately 6 weeks to complete and that I would give the patient a few weeks to heal following  surgery before starting treatment planning. We spoke about acute effects including skin irritation and fatigue as well as much less common late effects including lung irritation. We spoke about the latest technology that is used to minimize the risk of late effects for breast  cancer patients undergoing radiotherapy. No guarantees of treatment were given. The patient is enthusiastic about proceeding with treatment. I look forward to participating in the patient's care.  Genetic testing is pending.   __________________________________________   Eppie Gibson, MD

## 2014-09-11 NOTE — Progress Notes (Signed)
Beth Rice  [phone] [phone]     ID: Beth Rice DOB: [birth year]  MR#: [id number]  LHT#:[id number]  Patient Care Team: Darlyne Russian, MD as PCP - General (Family Medicine) [name], MD as Consulting Physician (General Surgery) Chauncey Cruel, MD as Consulting Physician (Oncology) Eppie Gibson, MD as Attending Physician (Radiation Oncology) Rockwell Germany, RN as Registered Nurse Mauro Kaufmann, RN as Registered Nurse PCP: [name], MD OTHER MD: Jamie Kato MD  CHIEF COMPLAINT: HER-2 positive breast cancer  CURRENT TREATMENT: Neoadjuvant chemotherapy and anti-HER-2 immunotherapy   BREAST CANCER HISTORY: [name] herself palpated a mass in her right breast but "didn't think much about it". She saw [name] for routine gynecologic follow-up and he also palpated and immediately set her up for right diagnostic mammography with tomosynthesis and ultrasonography at [facility] 08/27/2014. In the upper outer quadrant of the right breast there was a spiculated mass accompanied by calcifications, the complex extending up to 2.5 cm. This was palpable and mobile. It measured approximately 2 cm by palpation. There was no palpable right axillary adenopathy. Ultrasound of the right axilla also showed normal axillary contents.  Ultrasound of the right breast confirmed an irregularly marginated hypoechoic mass measuring 1.8 cm. Biopsy was not immediately performed because of patient was on aspirin at the time. On 08/30/2014 [name] underwent biopsy of the mass in question, with the pathology ([id number]) (showing an invasive ductal carcinoma, grade 2, estrogen receptor 2% positive with moderate staining intensity, progesterone receptor negative, with an MIB-1 of 81% and with HER-2 amplified, the signals ratio being 2.87 and the number per cell 4.30.  On 09/03/2024 she underwent bilateral breast MRI. This found the breast composition to be  category C. In the right breast there was a spiculated mass measuring 2.3 cm in close proximity to the pectoralis but without obvious invasion of the muscle. The rest of the breast, the left breast, and the lymph node areas well otherwise unremarkable.  Her subsequent history is as detailed below   INTERVAL HISTORY: [name] was evaluated in the multidisciplinary breast cancer conference 09/11/2014, accompanied by her sister [name]. Her case was also presented at the multidisciplinary breast cancer conference that same morning. The preliminary plan suggested there was for neoadjuvant therapy to be followed by breast conserving surgery then radiation. Hormones were to be considered at the completion of radiation    REVIEW OF SYSTEMS:  Autumm has wide in retrospect may have been a severe viral illness early in 2016. She had extensive workup at the time including a head CT 07/02/2014, a barium swallowed 07/09/2014, bilateral carotid Dopplers 08/19/2014 and brain MRI 08/27/2014. None of these studies found and actionable abnormality. Currently she complains of night sweats and hot flashes. She sleeps poorly. She is worried about her weight. She describes herself as fatigue. She has pain in her back and joints. This is not more intense or persistent than before. She has sinus symptoms. She complains of gum disease and other dental problems. Her appetite is poor. She feels depressed. A detailed review of systems was otherwise noncontributory  PAST MEDICAL HISTORY: Past Medical History  Diagnosis Date  . Anxiety   . Elevated cholesterol   . Depression   . Chest pain     a. 09/2008 Cath: ER 60%, LM nl, LAD 56m, 10m/d, LCX nondom, nl, RCA dom, 59m, PDA/PLA nl.  . Tobacco abuse   . H/O degenerative disc disease   .  Myocardial infarction     "I think I had a heart attack in 2010"  . Headache     "frequency depends on the weather" (06/04/2014)  . Arthritis     "neck" (06/04/2014)  . Chronic lower back pain   .  Gastroesophageal reflux disease     H/O  . Hot flashes     PAST SURGICAL HISTORY: Past Surgical History  Procedure Laterality Date  . Ovarian cyst removal  1980's?  . Tonsillectomy    . Cardiac catheterization  10/15/2008    noncritical mid & distal third anterior descending disease    FAMILY HISTORY Family History  Problem Relation Age of Onset  . Hypertension Sister   . Heart disease Mother   . Alzheimer's disease Mother   . Cerebral aneurysm Father   . Mental illness Father   The patient's father died at the age of 75 after having cerebral hemorrhages and age 3 and 42. The patient's mother died at the age of 72 with Parkinson's and Alzheimer's disease. The patient's mother had one sister and this sister was diagnosed with ovarian cancer in her early 32s. There is no other history of breast or ovarian cancer in the family.   GYNECOLOGIC HISTORY:  No LMP recorded. Patient is postmenopausal.  menarche age 59, the patient is GX P0. She stopped having periods in 2005. She did not take hormone replacement.   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 She is in process of retiring, with targeted retirement date in June. She is single, lives alone with 4 dogs.    ADVANCED DIRECTIVES: Not in place. She tells me she plans to name her sister [name] as her healthcare power of attorney. [name] (who is a Marine scientist) can be reached in [place] at [phone] (cell) or [phone] (home).   HEALTH MAINTENANCE: History  Substance Use Topics  . Smoking status: Former Smoker -- 1.00 packs/day for 20 years    Types: Cigarettes    Quit date: 06/21/2014  . Smokeless tobacco: Never Used  . Alcohol Use: 1.8 oz/week    3 Cans of beer, 0 Standard drinks or equivalent per week     Colonoscopy:2006/[facility]  DJM:[facility] 2016   B[facility] density:09/10/[facility]/normal   Lipid panel:  Allergies    Allergen Reactions  . Penicillins Hives  . Statins     Patient has significant muscle cramps with statins. She cannot tolerate them.  . Sulfa Antibiotics Hives    Current Outpatient Prescriptions  Medication Sig Dispense Refill  . ALPRAZolam (XANAX) 0.5 MG tablet TAKE 1 TABLET BY MOUTH EVERY 6 HOURS AS NEEDED FOR MUSCLE SPASMS 30 tablet 2  . aspirin EC 81 MG tablet Take 81 mg by mouth daily.    Marland Kitchen b complex vitamins capsule Take 1 capsule by mouth at bedtime.     . Calcium Carbonate-Vitamin D (CALCIUM + D PO) Take 1 tablet by mouth at bedtime.     . clonazePAM (KLONOPIN) 1 MG tablet TAKE 1 TABLET AT BEDTIME AS NEEDED FOR ANXIETY 90 tablet 0  . ezetimibe (ZETIA) 10 MG tablet Take 1 tablet (10 mg total) by mouth daily. 30 tablet 11  . fish oil-omega-3 fatty acids 1000 MG capsule Take 1 g by mouth at bedtime.     . fluticasone (FLONASE) 50 MCG/ACT nasal spray Place 2 sprays into both nostrils daily. 16 g 11  . levocetirizine (XYZAL) 5 MG tablet TAKE  1 TABLET (5 MG TOTAL) BY MOUTH EVERY EVENING. 30 tablet 3  . Lysine 500 MG CAPS Take 500 mg by mouth at bedtime.     . Multiple Vitamins-Minerals (MULTIVITAMIN WITH MINERALS) tablet Take 1 tablet by mouth at bedtime.     . pantoprazole (PROTONIX) 20 MG tablet Take 1 tablet by mouth daily.    . potassium citrate (UROCIT-K) 10 MEQ (1080 MG) SR tablet Take 10 mEq by mouth at bedtime.    . ranitidine (ZANTAC) 150 MG tablet TAKE 1 TABLET (150 MG TOTAL) BY MOUTH 2 (TWO) TIMES DAILY. 60 tablet 3  . Salicylic Acid 3 % SHAM Apply 1 application topically daily. 177 mL 0  . sertraline (ZOLOFT) 50 MG tablet Take 1 tablet (50 mg total) by mouth daily. 30 tablet 11   No current facility-administered medications for this visit.    OBJECTIVE: middle-aged white woman without appears older than stated age  33 Vitals:   09/11/14 1259  BP: 101/75  Pulse: 102  Temp: 98.1 F (36.7 C)  Resp: 18     Body mass index is 22.97 kg/(m^2).    ECOG FS:1 -  Symptomatic but completely ambulatory  Ocular: Sclerae unicteric, pupilround and equalat:  Oropharynx clearslightly dry  No cervical or supraclavicular adenopathy Lungs no rales or rhonchi, good excursion bilaterally Heart regular rate and rhythm, no murmur appreciated Abd soft, nontender, positive bowel sounds MSK no focal spinal tenderness, no joint edema Neuro: non-focal, well-oriented, appropriate affect Breasts: The right breast is status post recent biopsy. There is an easily palpable mass in the upper outer quadrant of the breast approximately 2 cm and altering the breast contour. There is no erythema or other skin involvement. The nipple is unremarkable. The right axilla is benign per the left breast is unremarkable.   LAB RESULTS:  CMP     Component Value Date/Time   NA 139 09/11/2014 1130   NA 139 06/18/2014 1133   K 4.1 09/11/2014 1130   K 4.2 06/18/2014 1133   CL 103 06/18/2014 1133   CO2 27 09/11/2014 1130   CO2 27 06/18/2014 1133   GLUCOSE 113 09/11/2014 1130   GLUCOSE 93 06/18/2014 1133   BUN 9.6 09/11/2014 1130   BUN 7 06/18/2014 1133   CREATININE 0.7 09/11/2014 1130   CREATININE 0.73 06/18/2014 1133   CREATININE 0.64 06/03/2014 1203   CALCIUM 9.1 09/11/2014 1130   CALCIUM 9.3 06/18/2014 1133   PROT 7.1 09/11/2014 1130   PROT 6.8 10/23/2013 1030   ALBUMIN 4.0 09/11/2014 1130   ALBUMIN 4.5 10/23/2013 1030   AST 25 09/11/2014 1130   AST 25 10/23/2013 1030   ALT 17 09/11/2014 1130   ALT 16 10/23/2013 1030   ALKPHOS 79 09/11/2014 1130   ALKPHOS 68 10/23/2013 1030   BILITOT 0.29 09/11/2014 1130   BILITOT 0.7 10/23/2013 1030   GFRNONAA >90 06/03/2014 1203   GFRNONAA >89 10/23/2013 1030   GFRAA >90 06/03/2014 1203   GFRAA >89 10/23/2013 1030    INo results found for: SPEP, UPEP  Lab Results  Component Value Date   WBC 6.9 09/11/2014   NEUTROABS 4.7 09/11/2014   HGB 14.0 09/11/2014   HCT 40.2 09/11/2014   MCV 96.6 09/11/2014   PLT 217 09/11/2014       Chemistry      Component Value Date/Time   NA 139 09/11/2014 1130   NA 139 06/18/2014 1133   K 4.1 09/11/2014 1130   K 4.2 06/18/2014 1133  CL 103 06/18/2014 1133   CO2 27 09/11/2014 1130   CO2 27 06/18/2014 1133   BUN 9.6 09/11/2014 1130   BUN 7 06/18/2014 1133   CREATININE 0.7 09/11/2014 1130   CREATININE 0.73 06/18/2014 1133   CREATININE 0.64 06/03/2014 1203      Component Value Date/Time   CALCIUM 9.1 09/11/2014 1130   CALCIUM 9.3 06/18/2014 1133   ALKPHOS 79 09/11/2014 1130   ALKPHOS 68 10/23/2013 1030   AST 25 09/11/2014 1130   AST 25 10/23/2013 1030   ALT 17 09/11/2014 1130   ALT 16 10/23/2013 1030   BILITOT 0.29 09/11/2014 1130   BILITOT 0.7 10/23/2013 1030       No results found for: LABCA2  No components found for: LABCA125  No results for input(s): INR in the last 168 hours.  Urinalysis    Component Value Date/Time   COLORURINE CANCELED 08/21/2014 1204   APPEARANCEUR CANCELED 08/21/2014 1204   LABSPEC CANCELED 08/21/2014 1204   PHURINE CANCELED 08/21/2014 1204   GLUCOSEU CANCELED 08/21/2014 1204   HGBUR CANCELED 08/21/2014 [place] 08/21/2014 1204   BILIRUBINUR neg 06/18/2014 1103   KETONESUR CANCELED 08/21/2014 1204   PROTEINUR CANCELED 08/21/2014 1204   PROTEINUR neg 06/18/2014 1103   UROBILINOGEN CANCELED 08/21/2014 1204   UROBILINOGEN 0.2 06/18/2014 1103   NITRITE CANCELED 08/21/2014 1204   NITRITE neg 06/18/2014 1103   LEUKOCYTESUR CANCELED 08/21/2014 1204    STUDIES: Mr Jeri Cos Wo Contrast  09/21/2014   CLINICAL DATA:  Gait abnormality.  BUN and creatinine were obtained on site at [facility] at  [address]  Results:  BUN 6 mg/dL,  Creatinine 0.7 mg/dL.  EXAM: MRI HEAD WITHOUT AND WITH CONTRAST  TECHNIQUE: Multiplanar, multiecho pulse sequences of the brain and surrounding structures were obtained without and with intravenous contrast.  CONTRAST:  72mL MULTIHANCE GADOBENATE DIMEGLUMINE 529 MG/ML  IV SOLN  COMPARISON:  CT head 07/02/2014  FINDINGS: Mild atrophy. Ventricles are mildly prominent consistent with atrophy.  Negative for acute infarct. Mild chronic microvascular ischemic change in the white matter and pons bilaterally. No cortical infarct. Cerebellum normal in signal and volume.  Negative for intracranial hemorrhage.  Negative for mass or edema.  Normal enhancement following contrast administration  Paranasal sinuses are clear.  Normal orbit bilaterally.  IMPRESSION: Atrophy and chronic microvascular ischemic change. No acute abnormality.   Electronically Signed   By: Franchot Gallo M.D.   On: 2014-09-21 19:45   Mr Breast Bilateral W Wo Contrast  09/04/2014   CLINICAL DATA:  59 year old female with recently diagnosed invasive ductal carcinoma of the right breast following ultrasound-guided biopsy of a palpable mass at the 12:30 position, with the mass and calcifications measuring 2.5 cm mammographically.  LABS:  Not applicable.  EXAM: BILATERAL BREAST MRI WITH AND WITHOUT CONTRAST  TECHNIQUE: Multiplanar, multisequence MR images of both breasts were obtained prior to and following the intravenous administration of 11 ml of MultiHance.  THREE-DIMENSIONAL MR IMAGE RENDERING ON INDEPENDENT WORKSTATION:  Three-dimensional MR images were rendered by post-processing of the original MR data on an independent workstation. The three-dimensional MR images were interpreted, and findings are reported in the following complete MRI report for this study. Three dimensional images were evaluated at the independent DynaCad workstation  COMPARISON:  Previous exam(s).  FINDINGS: Breast composition: c.  Heterogeneous fibroglandular tissue.  Background parenchymal enhancement: Moderate.  Right breast: Spiculated mass containing biopsy clip artifact in the superior right breast posterior depth at  the approximate 12:30 position measures 2.3 cm AP, 2.3 cm transverse, and 2.1 cm craniocaudal. The mass comes into close  proximity with the adjacent pectoralis muscle, however there are no findings to suggest muscular invasion. No additional suspicious areas of enhancement or masses seen in the right breast to suggest additional sites of disease.  Left breast: No suspicious rapidly enhancing masses or abnormal areas of enhancement seen in the left breast to suggest malignancy.  Lymph nodes: No morphologically abnormal axillary lymph nodes. No internal mammary lymphadenopathy.  Ancillary findings:  None.  IMPRESSION: 1. Biopsy proven malignancy in the superior posterior right breast at the approximate 12:30 position measures up to 2.3 cm, and corresponds to the mass and associated calcifications seen mammographically spanning approximately 2.5 cm. No findings to suggest additional sites of disease in the right breast.  2.  No MRI evidence of malignancy in the left breast.  RECOMMENDATION: Treatment plan for known right breast cancer.  BI-RADS CATEGORY  6: Known biopsy-proven malignancy.   Electronically Signed   By: Everlean Alstrom M.D.   On: 09/04/2014 10:46   US Carotid Duplex Bilateral  08/19/2014   CLINICAL DATA:  59 year old female with a history of gait abnormality.  Cardiovascular risk factors include hyperlipidemia and former smoking.  EXAM: BILATERAL CAROTID DUPLEX ULTRASOUND  TECHNIQUE: Pearline Cables scale imaging, color Doppler and duplex ultrasound were performed of bilateral carotid and vertebral arteries in the neck.  COMPARISON:  None  FINDINGS: Criteria: Quantification of carotid stenosis is based on velocity parameters that correlate the residual internal carotid diameter with NASCET-based stenosis levels, using the diameter of the distal internal carotid lumen as the denominator for stenosis measurement.  The following velocity measurements were obtained:  RIGHT  ICA:  Systolic 967 cm/sec, Diastolic 43 cm/sec  CCA:  81 cm/sec  SYSTOLIC ICA/CCA RATIO:  1.2  ECA:  76 cm/sec  LEFT  ICA:  Systolic 63 cm/sec, Diastolic 29  cm/sec  CCA:  75 cm/sec  SYSTOLIC ICA/CCA RATIO:  0.8  ECA:  69 cm/sec  Right Brachial SBP: 591 systolic  Left Brachial SBP: 99 systolic  RIGHT CAROTID ARTERY: No significant atherosclerotic disease of the right common carotid artery. Intermediate waveform maintained. Low resistance waveform of the right ICA. Mild heterogeneous plaque at the right carotid bifurcation.  RIGHT VERTEBRAL ARTERY: Antegrade flow with low resistance waveform.  LEFT CAROTID ARTERY: No significant atherosclerotic disease of the left common carotid artery. Intermediate waveform maintained. Mild heterogeneous plaque at the left carotid bifurcation. Low resistance waveform of the left ICA.  LEFT VERTEBRAL ARTERY:  Antegrade flow with low resistance waveform.  IMPRESSION: Color duplex indicates minimal heterogeneous plaque, with no hemodynamically significant stenosis by duplex criteria in the extracranial cerebrovascular circulation.  Signed,  Dulcy Fanny. Earleen Newport, DO  [facility]  [facility]   Electronically Signed   By: Corrie Mckusick D.O.   On: 08/19/2014 15:38   Mm Digital Diagnostic Unilat R  08/30/2014   CLINICAL DATA:  Post biopsy clip mammograms following ultrasound-guided core needle biopsy of the right breast.  EXAM: DIAGNOSTIC RIGHT MAMMOGRAM POST ULTRASOUND BIOPSY  COMPARISON:  Previous exam(s).  FINDINGS: Mammographic images were obtained following ultrasound guided biopsy of a 12 o'clock position irregular hypoechoic right breast mass. The coil shaped biopsy clip projects within the mass.  IMPRESSION: Well-positioned coil shaped biopsy clip following ultrasound-guided core needle biopsy of a suspicious right breast mass.  Final Assessment: Post Procedure Mammograms for Marker Placement   Electronically Signed  By: Lajean Manes M.D.   On: 08/30/2014 11:59   US Breast Ltd Uni Right Inc Axilla  08/27/2014   CLINICAL DATA:  Patient has developed a palpable right breast mass in the past  month.  EXAM: DIGITAL DIAGNOSTIC right breast MAMMOGRAM WITH 3D TOMOSYNTHESIS WITH CAD  ULTRASOUND right BREAST  COMPARISON:  10/04/2013, 10/03/2012, 09/15/2010.  ACR Breast Density Category c: The breast tissue is heterogeneously dense, which may obscure small masses.  FINDINGS: The patient has developed a spiculated mass located superiorly within the right breast at the 12:30 o'clock position with central pleomorphic calcifications extending anteriorly from the mass. The mass and calcifications extending anteriorly span 2.5 cm. The right breast is otherwise unchanged. This does correspond to the palpable finding on physical examination.  Mammographic images were processed with CAD.  On physical exam, there is a firm, mobile palpable mass located superiorly within the right breast at 12:30 o'clock position 8 cm from the nipple. By physical examination this measures approximately 2 cm in size. There is no palpable right axillary adenopathy.  Targeted ultrasound is performed, showing an irregularly marginated hypoechoic mass with shadowing located within the right breast at 12:30 o'clock position 8 cm from the nipple measuring 1.8 x 1.7 x 1.7 cm in size. This is suspicious for invasive mammary carcinoma. I have discussed ultrasound-guided core biopsy of the mass with the patient. The patient is currently on daily aspirin. She plans to discontinue this at this time. Core biopsy will be scheduled.  Ultrasound of the right axilla demonstrates normal axillary contents and no evidence for adenopathy.  IMPRESSION: 1.8 cm suspicious mass located within the right breast at 12:30 o'clock position 8 cm from the nipple. There are calcifications which extend anteriorly from the mass. The mass with calcifications spans 2.5 cm. Tissue sampling is recommended and ultrasound-guided core biopsy will be scheduled.  RECOMMENDATION: Right breast ultrasound-guided core biopsy.  I have discussed the findings and recommendations with the  patient. Results were also provided in writing at the conclusion of the visit. If applicable, a reminder letter will be sent to the patient regarding the next appointment.  BI-RADS CATEGORY  5: Highly suggestive of malignancy.   Electronically Signed   By: Altamese Cabal M.D.   On: 08/27/2014 13:41   Mm Diag Breast Tomo Uni Left  09/04/2014   CLINICAL DATA:  59 year old female with recently diagnosed invasive carcinoma of the right breast. Patient presents for pre MRI mammography of the left breast.  EXAM: DIGITAL DIAGNOSTIC LEFT MAMMOGRAM WITH 3D TOMOSYNTHESIS AND CAD  COMPARISON:  Previous exams.  ACR Breast Density Category c: The breast tissue is heterogeneously dense, which may obscure small masses.  FINDINGS: No suspicious masses or calcifications are seen in the left breast. There is no mammographic evidence of malignancy in the left breast.  Mammographic images were processed with CAD.  IMPRESSION: No mammographic evidence of malignancy in the left breast.  RECOMMENDATION: Treatment plan for known right breast malignancy.  I have discussed the findings and recommendations with the patient. Results were also provided in writing at the conclusion of the visit. If applicable, a reminder letter will be sent to the patient regarding the next appointment.  BI-RADS CATEGORY  1: Negative.   Electronically Signed   By: Everlean Alstrom M.D.   On: 09/04/2014 11:14   Mm Diag Breast Tomo Uni Right  08/27/2014   CLINICAL DATA:  Patient has developed a palpable right breast mass in the past month.  EXAM: DIGITAL  DIAGNOSTIC right breast MAMMOGRAM WITH 3D TOMOSYNTHESIS WITH CAD  ULTRASOUND right BREAST  COMPARISON:  10/04/2013, 10/03/2012, 09/15/2010.  ACR Breast Density Category c: The breast tissue is heterogeneously dense, which may obscure small masses.  FINDINGS: The patient has developed a spiculated mass located superiorly within the right breast at the 12:30 o'clock position with central pleomorphic  calcifications extending anteriorly from the mass. The mass and calcifications extending anteriorly span 2.5 cm. The right breast is otherwise unchanged. This does correspond to the palpable finding on physical examination.  Mammographic images were processed with CAD.  On physical exam, there is a firm, mobile palpable mass located superiorly within the right breast at 12:30 o'clock position 8 cm from the nipple. By physical examination this measures approximately 2 cm in size. There is no palpable right axillary adenopathy.  Targeted ultrasound is performed, showing an irregularly marginated hypoechoic mass with shadowing located within the right breast at 12:30 o'clock position 8 cm from the nipple measuring 1.8 x 1.7 x 1.7 cm in size. This is suspicious for invasive mammary carcinoma. I have discussed ultrasound-guided core biopsy of the mass with the patient. The patient is currently on daily aspirin. She plans to discontinue this at this time. Core biopsy will be scheduled.  Ultrasound of the right axilla demonstrates normal axillary contents and no evidence for adenopathy.  IMPRESSION: 1.8 cm suspicious mass located within the right breast at 12:30 o'clock position 8 cm from the nipple. There are calcifications which extend anteriorly from the mass. The mass with calcifications spans 2.5 cm. Tissue sampling is recommended and ultrasound-guided core biopsy will be scheduled.  RECOMMENDATION: Right breast ultrasound-guided core biopsy.  I have discussed the findings and recommendations with the patient. Results were also provided in writing at the conclusion of the visit. If applicable, a reminder letter will be sent to the patient regarding the next appointment.  BI-RADS CATEGORY  5: Highly suggestive of malignancy.   Electronically Signed   By: Altamese Cabal M.D.   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 This was found to be concordant by Dr. Lajean Manes. Pathology was discussed with the patient by telephone. She reported doing well after the biopsy. Post biopsy instructions and care were reviewed and her questions were answered. She has been [facility] at [facility] on September 11, 2014. A bilateral breast MRI has been scheduled on September 04, 2014. She is encouraged to come to [facility] for educational materials. My number is provided for future questions and concerns.  Pathology results reported by Drema Pry, BSN on September 02, 2014.   Electronically Signed   By: Lajean Manes M.D.   On: 09/02/2014 14:25   09/02/2014   CLINICAL DATA:  Patient presents for ultrasound-guided core needle biopsy of a suspicious, 1.8 cm, upper posterior right breast mass.  EXAM: ULTRASOUND GUIDED RIGHT BREAST CORE NEEDLE BIOPSY  COMPARISON:  Previous exam(s).  PROCEDURE: I met with the patient and we discussed the procedure of ultrasound-guided biopsy, including benefits and alternatives. We discussed the high likelihood of a successful procedure. We discussed the risks of the procedure including infection, bleeding, tissue injury, clip migration, and inadequate sampling. Informed written consent was given. The usual time-out protocol  was performed immediately prior to the procedure.  Using sterile technique and 2% Lidocaine as local anesthetic, under direct ultrasound visualization, a 12 gauge vacuum-assisted device was used to perform biopsy of the hypoechoic irregular 18 mm massusing a lateral approach. At the conclusion of the procedure, a coil shaped tissue marker clip was deployed into the biopsy cavity. Follow-up 2-view mammogram was performed and dictated separately.  IMPRESSION: Ultrasound-guided biopsy of a highly suspicious right breast mass. No apparent  complications.  Electronically Signed: By: Lajean Manes M.D. On: 08/30/2014 11:58    ASSESSMENT: 60 y.o. [place] woman status post right breast biopsy 08/30/2014 for a clinical T2 N0, stage IIA invasive ductal carcinoma, grade 2, estrogen receptor2% "positive," progesterone receptor negative, HER-2 positive, with an MIB-1 of 81%.  (1) neoadjuvant chemotherapy will consist of carboplatin, docetaxel, trastuzumab and pertuzumab  (2) trastuzumab will be continued to complete 1 year  (3) surgery will follow chemotherapy  (4) adjuvant radiation will follow surgery  (5) consider anti-estrogens after radiation  (6) genetics testing pending  PLAN: We spent the better part of today's hour-long appointment discussing the biology of breast cancer in general, and the specifics of the patient's tumor in particular. Azaylea understands she has a stage II breast cancer and that this is eminently curable. The tumor is very minimally estrogen receptor positive and we cannot rely on anti-estrogens to be the main systemic treatment. Instead, for systemic treatment, she will receive chemotherapy and anti-HER-2 immunotherapy.  We discussed the specific agents, which will be carboplatin, and docetaxel on the chemotherapy side, and Herceptin and progesterone on the anti-HER-2 side. We discussed the possible toxicities, side effects and complications of these agents. She understands that for local treatment she will need a lumpectomy and sentinel lymph node sampling and radiation.  Once she completes all the above she can consider anti-estrogens which may confer a small marginal benefit in terms of reducing the risk of recurrence.  She will need an echocardiogram, come to "chemotherapy school", and meet with our advanced practice provider to discuss supportive medications and to make sure all the appointments have been entered appropriately. Because of the family history of ovarian cancer she will also benefit  from genetics testing.  Kemyah has a good understanding of the overall plan. She agrees with it. She knows the goal of treatment in her case is cure. She will call with any problems that may develop before her next visit here.  Chauncey Cruel, MD   09/11/2014 2:18 PM Medical Oncology and Hematology [facility] [address] [place], [place] [address] Tel. [phone]    Fax. [phone]

## 2014-09-11 NOTE — Telephone Encounter (Signed)
Per staff message and POF I have scheduled appts. Advised scheduler of appts. JMW  

## 2014-09-11 NOTE — Patient Instructions (Signed)

## 2014-09-12 ENCOUNTER — Encounter (HOSPITAL_BASED_OUTPATIENT_CLINIC_OR_DEPARTMENT_OTHER): Payer: Self-pay | Admitting: *Deleted

## 2014-09-12 ENCOUNTER — Telehealth: Payer: Self-pay | Admitting: Neurology

## 2014-09-12 ENCOUNTER — Other Ambulatory Visit: Payer: Self-pay | Admitting: Oncology

## 2014-09-12 NOTE — Telephone Encounter (Signed)
Pt called to cancel her NP appt for 09/25/14. Pt stated that she was recently diagnosed with breat cancer and wants to hold off for right now on coming in. Dr [name] Daub/referring provider was notified.

## 2014-09-12 NOTE — Progress Notes (Signed)
Labs done 09/11/14-cxr and ekg recent

## 2014-09-13 ENCOUNTER — Telehealth: Payer: Self-pay | Admitting: Oncology

## 2014-09-13 NOTE — Telephone Encounter (Signed)
Confirmed appointment for 04/18.

## 2014-09-16 ENCOUNTER — Encounter: Payer: Self-pay | Admitting: Nurse Practitioner

## 2014-09-16 ENCOUNTER — Ambulatory Visit (HOSPITAL_BASED_OUTPATIENT_CLINIC_OR_DEPARTMENT_OTHER): Payer: BC Managed Care – PPO | Admitting: Nurse Practitioner

## 2014-09-16 ENCOUNTER — Other Ambulatory Visit: Payer: Self-pay | Admitting: *Deleted

## 2014-09-16 VITALS — BP 120/71 | HR 102 | Temp 98.2°F | Resp 18 | Ht 62.0 in | Wt 124.3 lb

## 2014-09-16 DIAGNOSIS — C50811 Malignant neoplasm of overlapping sites of right female breast: Secondary | ICD-10-CM

## 2014-09-16 DIAGNOSIS — C50211 Malignant neoplasm of upper-inner quadrant of right female breast: Secondary | ICD-10-CM

## 2014-09-16 MED ORDER — ONDANSETRON HCL 8 MG PO TABS: 8.0000 mg | ORAL_TABLET | Freq: Two times a day (BID) | ORAL | Status: DC

## 2014-09-16 MED ORDER — LORAZEPAM 0.5 MG PO TABS: 0.5000 mg | ORAL_TABLET | Freq: Four times a day (QID) | ORAL | Status: DC | PRN

## 2014-09-16 MED ORDER — LIDOCAINE-PRILOCAINE 2.5-2.5 % EX CREA: TOPICAL_CREAM | CUTANEOUS | Status: DC

## 2014-09-16 MED ORDER — DEXAMETHASONE 4 MG PO TABS: 8.0000 mg | ORAL_TABLET | Freq: Two times a day (BID) | ORAL | Status: DC

## 2014-09-16 MED ORDER — PROCHLORPERAZINE MALEATE 10 MG PO TABS: 10.0000 mg | ORAL_TABLET | Freq: Four times a day (QID) | ORAL | Status: DC | PRN

## 2014-09-16 NOTE — Progress Notes (Signed)
[place]  [phone] [phone]     ID: Beth Rice DOB: March 15, 1956  MR#: [id number]  JYN#:[id number]  Patient Care Team: Darlyne Russian, MD as PCP - General (Family Medicine) [name], MD as Consulting Physician (General Surgery) Chauncey Cruel, MD as Consulting Physician (Oncology) Eppie Gibson, MD as Attending Physician (Radiation Oncology) Rockwell Germany, RN as Registered Nurse Mauro Kaufmann, RN as Registered Nurse PCP: [name], MD OTHER MD: Jamie Kato MD  CHIEF COMPLAINT: HER-2 positive breast cancer  CURRENT TREATMENT: Neoadjuvant chemotherapy and anti-HER-2 immunotherapy   BREAST CANCER HISTORY: Beth Rice herself palpated a mass in her right breast but "didn't think much about it". She saw [name] for routine gynecologic follow-up and he also palpated and immediately set her up for right diagnostic mammography with tomosynthesis and ultrasonography at [facility] 08/27/2014. In the upper outer quadrant of the right breast there was a spiculated mass accompanied by calcifications, the complex extending up to 2.5 cm. This was palpable and mobile. It measured approximately 2 cm by palpation. There was no palpable right axillary adenopathy. Ultrasound of the right axilla also showed normal axillary contents.  Ultrasound of the right breast confirmed an irregularly marginated hypoechoic mass measuring 1.8 cm. Biopsy was not immediately performed because of patient was on aspirin at the time. On 08/30/2014 [name] underwent biopsy of the mass in question, with the pathology ([id number]) (showing an invasive ductal carcinoma, grade 2, estrogen receptor 2% positive with moderate staining intensity, progesterone receptor negative, with an MIB-1 of 81% and with HER-2 amplified, the signals ratio being 2.87 and the number per cell 4.30.  On 09/03/2024 she underwent bilateral breast MRI. This found the breast composition to be  category C. In the right breast there was a spiculated mass measuring 2.3 cm in close proximity to the pectoralis but without obvious invasion of the muscle. The rest of the breast, the left breast, and the lymph node areas well otherwise unremarkable.  Her subsequent history is as detailed below   INTERVAL HISTORY: [name] returns for follow up of her breast cancer, accompanied by her sister [name]. She is to have a port placed tomorrow and begin chemotherapy on Monday.  REVIEW OF SYSTEMS: Currently she complains of night sweats and hot flashes. She sleeps poorly. She is worried about her weight. She describes herself as fatigued. She has pain in her back and joints. This is not more intense or persistent than before. She has sinus symptoms. She complains of gum disease and other dental problems. Her appetite is poor. She feels depressed. A detailed review of systems was otherwise noncontributory  PAST MEDICAL HISTORY: Past Medical History  Diagnosis Date  . Anxiety   . Elevated cholesterol   . Depression   . Chest pain     a. 09/2008 Cath: ER 60%, LM nl, LAD 22m 424m, LCX nondom, nl, RCA dom, 2019mDA/PLA nl.  . Tobacco abuse   . H/O degenerative disc disease   . Myocardial infarction     "I think I had a heart attack in 2010"  . Headache     "frequency depends on the weather" (06/04/2014)  . Arthritis     "neck" (06/04/2014)  . Chronic lower back pain   . Gastroesophageal reflux disease     H/O  . Hot flashes     PAST SURGICAL HISTORY: Past Surgical History  Procedure Laterality Date  . Ovarian cyst removal  1980's?  .Marland Kitchen  Tonsillectomy    . Cardiac catheterization  10/15/2008    noncritical mid & distal third anterior descending disease    FAMILY HISTORY Family History  Problem Relation Age of Onset  . Hypertension Sister   . Heart disease Mother   . Alzheimer's disease Mother   . Cerebral aneurysm Father   . Mental illness Father   The patient's father died at the age of 39  after having cerebral hemorrhages and age 6 and 28. The patient's mother died at the age of 23 with Parkinson's and Alzheimer's disease. The patient's mother had one sister and this sister was diagnosed with ovarian cancer in her early 43s. There is no other history of breast or ovarian cancer in the family.   GYNECOLOGIC HISTORY:  No LMP recorded. Patient is postmenopausal.  menarche age 83, the patient is GX P0. She stopped having periods in 2005. She did not take hormone replacement.   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 She is in process of retiring, with targeted retirement date in June. She is single, lives alone with 4 dogs.    ADVANCED DIRECTIVES: Not in place. She tells me she plans to name her sister [name] as her healthcare power of attorney. [name] (who is a Marine scientist) can be reached in [place] at [phone] (cell) or [phone] (home).   HEALTH MAINTENANCE: History  Substance Use Topics  . Smoking status: Former Smoker -- 1.00 packs/day for 20 years    Types: Cigarettes    Quit date: 06/21/2014  . Smokeless tobacco: Never Used  . Alcohol Use: 1.8 oz/week    3 Cans of beer, 0 Standard drinks or equivalent per week     Colonoscopy:2006/[facility]  YHC:[facility] 2016   B[facility] density:09/10/2008 at [facility]/normal   Lipid panel:  Allergies  Allergen Reactions  . Penicillins Hives  . Statins     Patient has significant muscle cramps with statins. She cannot tolerate them.  . Sulfa Antibiotics Hives    Current Outpatient Prescriptions  Medication Sig Dispense Refill  . ALPRAZolam (XANAX) 0.5 MG tablet TAKE 1 TABLET BY MOUTH EVERY 6 HOURS AS NEEDED FOR MUSCLE SPASMS 30 tablet 2  . b complex vitamins capsule Take 1 capsule by mouth at bedtime.     . Calcium Carbonate-Vitamin D (CALCIUM + D PO) Take 1 tablet by mouth at bedtime.     . clonazePAM (KLONOPIN) 1 MG tablet  TAKE 1 TABLET AT BEDTIME AS NEEDED FOR ANXIETY 90 tablet 0  . fish oil-omega-3 fatty acids 1000 MG capsule Take 1 g by mouth at bedtime.     . fluticasone (FLONASE) 50 MCG/ACT nasal spray Place 2 sprays into both nostrils daily. 16 g 11  . levocetirizine (XYZAL) 5 MG tablet TAKE 1 TABLET (5 MG TOTAL) BY MOUTH EVERY EVENING. 30 tablet 3  . Lysine 500 MG CAPS Take 500 mg by mouth at bedtime.     . ranitidine (ZANTAC) 150 MG tablet TAKE 1 TABLET (150 MG TOTAL) BY MOUTH 2 (TWO) TIMES DAILY. 60 tablet 3  . sertraline (ZOLOFT) 50 MG tablet Take 1 tablet (50 mg total) by mouth daily. 30 tablet 11  . aspirin EC 81 MG tablet Take 81 mg by mouth daily.    Marland Kitchen dexamethasone (DECADRON) 4 MG tablet Take 2 tablets (8 mg total) by mouth 2 (two) times daily. Start the day before Taxotere. Then again the day after chemo for 3  days. 30 tablet 1  . ezetimibe (ZETIA) 10 MG tablet Take 1 tablet (10 mg total) by mouth daily. (Patient not taking: Reported on 09/16/2014) 30 tablet 11  . lidocaine-prilocaine (EMLA) cream Apply to affected area once 30 g 3  . LORazepam (ATIVAN) 0.5 MG tablet Take 1 tablet (0.5 mg total) by mouth every 6 (six) hours as needed (Nausea or vomiting). 30 tablet 0  . Multiple Vitamins-Minerals (MULTIVITAMIN WITH MINERALS) tablet Take 1 tablet by mouth at bedtime.     . ondansetron (ZOFRAN) 8 MG tablet Take 1 tablet (8 mg total) by mouth 2 (two) times daily. Start the day after chemo for 3 days. Then take as needed for nausea or vomiting. 30 tablet 1  . prochlorperazine (COMPAZINE) 10 MG tablet Take 1 tablet (10 mg total) by mouth every 6 (six) hours as needed (Nausea or vomiting). 30 tablet 1  . Salicylic Acid 3 % SHAM Apply 1 application topically daily. (Patient not taking: Reported on 09/16/2014) 177 mL 0   No current facility-administered medications for this visit.    OBJECTIVE: middle-aged white woman without appears older than stated age  3 Vitals:   09/16/14 1505  BP: 120/71  Pulse:  102  Temp: 98.2 F (36.8 C)  Resp: 18     Body mass index is 22.73 kg/(m^2).    ECOG FS:1 - Symptomatic but completely ambulatory  Skin: warm, dry  HEENT: sclerae anicteric, conjunctivae pink, oropharynx clear. No thrush or mucositis.  Lymph Nodes: No cervical or supraclavicular lymphadenopathy  Lungs: clear to auscultation bilaterally, no rales, wheezes, or rhonci  Heart: regular rate and rhythm  Abdomen: round, soft, non tender, positive bowel sounds  Musculoskeletal: No focal spinal tenderness, no peripheral edema  Neuro: non focal, well oriented, positive affect  Breasts: deferred   LAB RESULTS:  CMP     Component Value Date/Time   NA 139 09/11/2014 1130   NA 139 06/18/2014 1133   K 4.1 09/11/2014 1130   K 4.2 06/18/2014 1133   CL 103 06/18/2014 1133   CO2 27 09/11/2014 1130   CO2 27 06/18/2014 1133   GLUCOSE 113 09/11/2014 1130   GLUCOSE 93 06/18/2014 1133   BUN 9.6 09/11/2014 1130   BUN 7 06/18/2014 1133   CREATININE 0.7 09/11/2014 1130   CREATININE 0.73 06/18/2014 1133   CREATININE 0.64 06/03/2014 1203   CALCIUM 9.1 09/11/2014 1130   CALCIUM 9.3 06/18/2014 1133   PROT 7.1 09/11/2014 1130   PROT 6.8 10/23/2013 1030   ALBUMIN 4.0 09/11/2014 1130   ALBUMIN 4.5 10/23/2013 1030   AST 25 09/11/2014 1130   AST 25 10/23/2013 1030   ALT 17 09/11/2014 1130   ALT 16 10/23/2013 1030   ALKPHOS 79 09/11/2014 1130   ALKPHOS 68 10/23/2013 1030   BILITOT 0.29 09/11/2014 1130   BILITOT 0.7 10/23/2013 1030   GFRNONAA >90 06/03/2014 1203   GFRNONAA >89 10/23/2013 1030   GFRAA >90 06/03/2014 1203   GFRAA >89 10/23/2013 1030    INo results found for: SPEP, UPEP  Lab Results  Component Value Date   WBC 6.9 09/11/2014   NEUTROABS 4.7 09/11/2014   HGB 14.0 09/11/2014   HCT 40.2 09/11/2014   MCV 96.6 09/11/2014   PLT 217 09/11/2014      Chemistry      Component Value Date/Time   NA 139 09/11/2014 1130   NA 139 06/18/2014 1133   K 4.1 09/11/2014 1130   K 4.2  06/18/2014 1133  CL 103 06/18/2014 1133   CO2 27 09/11/2014 1130   CO2 27 06/18/2014 1133   BUN 9.6 09/11/2014 1130   BUN 7 06/18/2014 1133   CREATININE 0.7 09/11/2014 1130   CREATININE 0.73 06/18/2014 1133   CREATININE 0.64 06/03/2014 1203      Component Value Date/Time   CALCIUM 9.1 09/11/2014 1130   CALCIUM 9.3 06/18/2014 1133   ALKPHOS 79 09/11/2014 1130   ALKPHOS 68 10/23/2013 1030   AST 25 09/11/2014 1130   AST 25 10/23/2013 1030   ALT 17 09/11/2014 1130   ALT 16 10/23/2013 1030   BILITOT 0.29 09/11/2014 1130   BILITOT 0.7 10/23/2013 1030       No results found for: LABCA2  No components found for: LABCA125  No results for input(s): INR in the last 168 hours.  Urinalysis    Component Value Date/Time   COLORURINE CANCELED 08/21/2014 1204   APPEARANCEUR CANCELED 08/21/2014 1204   LABSPEC CANCELED 08/21/2014 1204   PHURINE CANCELED 08/21/2014 1204   GLUCOSEU CANCELED 08/21/2014 1204   HGBUR CANCELED 08/21/2014 [place] 08/21/2014 1204   BILIRUBINUR neg 06/18/2014 1103   KETONESUR CANCELED 08/21/2014 1204   PROTEINUR CANCELED 08/21/2014 1204   PROTEINUR neg 06/18/2014 1103   UROBILINOGEN CANCELED 08/21/2014 1204   UROBILINOGEN 0.2 06/18/2014 1103   NITRITE CANCELED 08/21/2014 1204   NITRITE neg 06/18/2014 1103   LEUKOCYTESUR CANCELED 08/21/2014 1204    STUDIES: Mr Jeri Cos Wo Contrast  September 14, 2014   CLINICAL DATA:  Gait abnormality.  BUN and creatinine were obtained on site at [facility] at  [address]  Results:  BUN 6 mg/dL,  Creatinine 0.7 mg/dL.  EXAM: MRI HEAD WITHOUT AND WITH CONTRAST  TECHNIQUE: Multiplanar, multiecho pulse sequences of the brain and surrounding structures were obtained without and with intravenous contrast.  CONTRAST:  43m MULTIHANCE GADOBENATE DIMEGLUMINE 529 MG/ML IV SOLN  COMPARISON:  CT head 07/02/2014  FINDINGS: Mild atrophy. Ventricles are mildly prominent consistent with atrophy.  Negative for  acute infarct. Mild chronic microvascular ischemic change in the white matter and pons bilaterally. No cortical infarct. Cerebellum normal in signal and volume.  Negative for intracranial hemorrhage.  Negative for mass or edema.  Normal enhancement following contrast administration  Paranasal sinuses are clear.  Normal orbit bilaterally.  IMPRESSION: Atrophy and chronic microvascular ischemic change. No acute abnormality.   Electronically Signed   By: CFranchot GalloM.D.   On: 02016/08/1617:45   Mr Breast Bilateral W Wo Contrast  09/04/2014   CLINICAL DATA:  59year old female with recently diagnosed invasive ductal carcinoma of the right breast following ultrasound-guided biopsy of a palpable mass at the 12:30 position, with the mass and calcifications measuring 2.5 cm mammographically.  LABS:  Not applicable.  EXAM: BILATERAL BREAST MRI WITH AND WITHOUT CONTRAST  TECHNIQUE: Multiplanar, multisequence MR images of both breasts were obtained prior to and following the intravenous administration of 11 ml of MultiHance.  THREE-DIMENSIONAL MR IMAGE RENDERING ON INDEPENDENT WORKSTATION:  Three-dimensional MR images were rendered by post-processing of the original MR data on an independent workstation. The three-dimensional MR images were interpreted, and findings are reported in the following complete MRI report for this study. Three dimensional images were evaluated at the independent DynaCad workstation  COMPARISON:  Previous exam(s).  FINDINGS: Breast composition: c.  Heterogeneous fibroglandular tissue.  Background parenchymal enhancement: Moderate.  Right breast: Spiculated mass containing biopsy clip artifact in the superior right breast posterior depth at  the approximate 12:30 position measures 2.3 cm AP, 2.3 cm transverse, and 2.1 cm craniocaudal. The mass comes into close proximity with the adjacent pectoralis muscle, however there are no findings to suggest muscular invasion. No additional suspicious areas  of enhancement or masses seen in the right breast to suggest additional sites of disease.  Left breast: No suspicious rapidly enhancing masses or abnormal areas of enhancement seen in the left breast to suggest malignancy.  Lymph nodes: No morphologically abnormal axillary lymph nodes. No internal mammary lymphadenopathy.  Ancillary findings:  None.  IMPRESSION: 1. Biopsy proven malignancy in the superior posterior right breast at the approximate 12:30 position measures up to 2.3 cm, and corresponds to the mass and associated calcifications seen mammographically spanning approximately 2.5 cm. No findings to suggest additional sites of disease in the right breast.  2.  No MRI evidence of malignancy in the left breast.  RECOMMENDATION: Treatment plan for known right breast cancer.  BI-RADS CATEGORY  6: Known biopsy-proven malignancy.   Electronically Signed   By: Everlean Alstrom M.D.   On: 09/04/2014 10:46   US Carotid Duplex Bilateral  08/19/2014   CLINICAL DATA:  59 year old female with a history of gait abnormality.  Cardiovascular risk factors include hyperlipidemia and former smoking.  EXAM: BILATERAL CAROTID DUPLEX ULTRASOUND  TECHNIQUE: Pearline Cables scale imaging, color Doppler and duplex ultrasound were performed of bilateral carotid and vertebral arteries in the neck.  COMPARISON:  None  FINDINGS: Criteria: Quantification of carotid stenosis is based on velocity parameters that correlate the residual internal carotid diameter with NASCET-based stenosis levels, using the diameter of the distal internal carotid lumen as the denominator for stenosis measurement.  The following velocity measurements were obtained:  RIGHT  ICA:  Systolic 226 cm/sec, Diastolic 43 cm/sec  CCA:  81 cm/sec  SYSTOLIC ICA/CCA RATIO:  1.2  ECA:  76 cm/sec  LEFT  ICA:  Systolic 63 cm/sec, Diastolic 29 cm/sec  CCA:  75 cm/sec  SYSTOLIC ICA/CCA RATIO:  0.8  ECA:  69 cm/sec  Right Brachial SBP: 333 systolic  Left Brachial SBP: 99 systolic  RIGHT  CAROTID ARTERY: No significant atherosclerotic disease of the right common carotid artery. Intermediate waveform maintained. Low resistance waveform of the right ICA. Mild heterogeneous plaque at the right carotid bifurcation.  RIGHT VERTEBRAL ARTERY: Antegrade flow with low resistance waveform.  LEFT CAROTID ARTERY: No significant atherosclerotic disease of the left common carotid artery. Intermediate waveform maintained. Mild heterogeneous plaque at the left carotid bifurcation. Low resistance waveform of the left ICA.  LEFT VERTEBRAL ARTERY:  Antegrade flow with low resistance waveform.  IMPRESSION: Color duplex indicates minimal heterogeneous plaque, with no hemodynamically significant stenosis by duplex criteria in the extracranial cerebrovascular circulation.  Signed,  Dulcy Fanny. Earleen Newport, DO  [facility]  [facility]   Electronically Signed   By: Corrie Mckusick D.O.   On: 08/19/2014 15:38   Mm Digital Diagnostic Unilat R  08/30/2014   CLINICAL DATA:  Post biopsy clip mammograms following ultrasound-guided core needle biopsy of the right breast.  EXAM: DIAGNOSTIC RIGHT MAMMOGRAM POST ULTRASOUND BIOPSY  COMPARISON:  Previous exam(s).  FINDINGS: Mammographic images were obtained following ultrasound guided biopsy of a 12 o'clock position irregular hypoechoic right breast mass. The coil shaped biopsy clip projects within the mass.  IMPRESSION: Well-positioned coil shaped biopsy clip following ultrasound-guided core needle biopsy of a suspicious right breast mass.  Final Assessment: Post Procedure Mammograms for Marker Placement   Electronically Signed  By: Lajean Manes M.D.   On: 08/30/2014 11:59   US Breast Ltd Uni Right Inc Axilla  08/27/2014   CLINICAL DATA:  Patient has developed a palpable right breast mass in the past month.  EXAM: DIGITAL DIAGNOSTIC right breast MAMMOGRAM WITH 3D TOMOSYNTHESIS WITH CAD  ULTRASOUND right BREAST  COMPARISON:  10/04/2013,  10/03/2012, 09/15/2010.  ACR Breast Density Category c: The breast tissue is heterogeneously dense, which may obscure small masses.  FINDINGS: The patient has developed a spiculated mass located superiorly within the right breast at the 12:30 o'clock position with central pleomorphic calcifications extending anteriorly from the mass. The mass and calcifications extending anteriorly span 2.5 cm. The right breast is otherwise unchanged. This does correspond to the palpable finding on physical examination.  Mammographic images were processed with CAD.  On physical exam, there is a firm, mobile palpable mass located superiorly within the right breast at 12:30 o'clock position 8 cm from the nipple. By physical examination this measures approximately 2 cm in size. There is no palpable right axillary adenopathy.  Targeted ultrasound is performed, showing an irregularly marginated hypoechoic mass with shadowing located within the right breast at 12:30 o'clock position 8 cm from the nipple measuring 1.8 x 1.7 x 1.7 cm in size. This is suspicious for invasive mammary carcinoma. I have discussed ultrasound-guided core biopsy of the mass with the patient. The patient is currently on daily aspirin. She plans to discontinue this at this time. Core biopsy will be scheduled.  Ultrasound of the right axilla demonstrates normal axillary contents and no evidence for adenopathy.  IMPRESSION: 1.8 cm suspicious mass located within the right breast at 12:30 o'clock position 8 cm from the nipple. There are calcifications which extend anteriorly from the mass. The mass with calcifications spans 2.5 cm. Tissue sampling is recommended and ultrasound-guided core biopsy will be scheduled.  RECOMMENDATION: Right breast ultrasound-guided core biopsy.  I have discussed the findings and recommendations with the patient. Results were also provided in writing at the conclusion of the visit. If applicable, a reminder letter will be sent to the patient  regarding the next appointment.  BI-RADS CATEGORY  5: Highly suggestive of malignancy.   Electronically Signed   By: Altamese Cabal M.D.   On: 08/27/2014 13:41   Mm Diag Breast Tomo Uni Left  09/04/2014   CLINICAL DATA:  59 year old female with recently diagnosed invasive carcinoma of the right breast. Patient presents for pre MRI mammography of the left breast.  EXAM: DIGITAL DIAGNOSTIC LEFT MAMMOGRAM WITH 3D TOMOSYNTHESIS AND CAD  COMPARISON:  Previous exams.  ACR Breast Density Category c: The breast tissue is heterogeneously dense, which may obscure small masses.  FINDINGS: No suspicious masses or calcifications are seen in the left breast. There is no mammographic evidence of malignancy in the left breast.  Mammographic images were processed with CAD.  IMPRESSION: No mammographic evidence of malignancy in the left breast.  RECOMMENDATION: Treatment plan for known right breast malignancy.  I have discussed the findings and recommendations with the patient. Results were also provided in writing at the conclusion of the visit. If applicable, a reminder letter will be sent to the patient regarding the next appointment.  BI-RADS CATEGORY  1: Negative.   Electronically Signed   By: Everlean Alstrom M.D.   On: 09/04/2014 11:14   Mm Diag Breast Tomo Uni Right  08/27/2014   CLINICAL DATA:  Patient has developed a palpable right breast mass in the past month.  EXAM: DIGITAL  DIAGNOSTIC right breast MAMMOGRAM WITH 3D TOMOSYNTHESIS WITH CAD  ULTRASOUND right BREAST  COMPARISON:  10/04/2013, 10/03/2012, 09/15/2010.  ACR Breast Density Category c: The breast tissue is heterogeneously dense, which may obscure small masses.  FINDINGS: The patient has developed a spiculated mass located superiorly within the right breast at the 12:30 o'clock position with central pleomorphic calcifications extending anteriorly from the mass. The mass and calcifications extending anteriorly span 2.5 cm. The right breast is otherwise  unchanged. This does correspond to the palpable finding on physical examination.  Mammographic images were processed with CAD.  On physical exam, there is a firm, mobile palpable mass located superiorly within the right breast at 12:30 o'clock position 8 cm from the nipple. By physical examination this measures approximately 2 cm in size. There is no palpable right axillary adenopathy.  Targeted ultrasound is performed, showing an irregularly marginated hypoechoic mass with shadowing located within the right breast at 12:30 o'clock position 8 cm from the nipple measuring 1.8 x 1.7 x 1.7 cm in size. This is suspicious for invasive mammary carcinoma. I have discussed ultrasound-guided core biopsy of the mass with the patient. The patient is currently on daily aspirin. She plans to discontinue this at this time. Core biopsy will be scheduled.  Ultrasound of the right axilla demonstrates normal axillary contents and no evidence for adenopathy.  IMPRESSION: 1.8 cm suspicious mass located within the right breast at 12:30 o'clock position 8 cm from the nipple. There are calcifications which extend anteriorly from the mass. The mass with calcifications spans 2.5 cm. Tissue sampling is recommended and ultrasound-guided core biopsy will be scheduled.  RECOMMENDATION: Right breast ultrasound-guided core biopsy.  I have discussed the findings and recommendations with the patient. Results were also provided in writing at the conclusion of the visit. If applicable, a reminder letter will be sent to the patient regarding the next appointment.  BI-RADS CATEGORY  5: Highly suggestive of malignancy.   Electronically Signed   By: Altamese Cabal M.D.   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 This was found to be concordant by Dr.  Lajean Manes. Pathology was discussed with the patient by telephone. She reported doing well after the biopsy. Post biopsy instructions and care were reviewed and her questions were answered. She has been scheduled at [facility] on September 11, 2014. A bilateral breast MRI has been scheduled on September 04, 2014. She is encouraged to come to [facility] for educational materials. My number is provided for future questions and concerns.  Pathology results reported by Drema Pry, BSN on September 02, 2014.   Electronically Signed   By: Lajean Manes M.D.   On: 09/02/2014 14:25   09/02/2014   CLINICAL DATA:  Patient presents for ultrasound-guided core needle biopsy of a suspicious, 1.8 cm, upper posterior right breast mass.  EXAM: ULTRASOUND GUIDED RIGHT BREAST CORE NEEDLE BIOPSY  COMPARISON:  Previous exam(s).  PROCEDURE: I met with the patient and we discussed the procedure of ultrasound-guided biopsy, including benefits and alternatives. We discussed the high likelihood of a successful procedure. We discussed the risks of the procedure including infection, bleeding, tissue injury, clip migration, and inadequate sampling. Informed written consent was given. The usual time-out protocol  was performed immediately prior to the procedure.  Using sterile technique and 2% Lidocaine as local anesthetic, under direct ultrasound visualization, a 12 gauge vacuum-assisted device was used to perform biopsy of the hypoechoic irregular 18 mm massusing a lateral approach. At the conclusion of the procedure, a coil shaped tissue marker clip was deployed into the biopsy cavity. Follow-up 2-view mammogram was performed and dictated separately.  IMPRESSION: Ultrasound-guided biopsy of a highly suspicious right breast mass. No apparent complications.  Electronically Signed: By: Lajean Manes M.D. On: 08/30/2014 11:58    ASSESSMENT: 59 y.o. [place] woman status post right  breast biopsy 08/30/2014 for a clinical T2 N0, stage IIA invasive ductal carcinoma, grade 2, estrogen receptor2% "positive," progesterone receptor negative, HER-2 positive, with an MIB-1 of 81%.  (1) neoadjuvant chemotherapy will consist of carboplatin, docetaxel, trastuzumab and pertuzumab  (2) trastuzumab will be continued to complete 1 year  (3) surgery will follow chemotherapy  (4) adjuvant radiation will follow surgery  (5) consider anti-estrogens after radiation  (6) genetics testing pending  PLAN: Tamilyn and I spent about 40 minutes discussing her upcoming chemotherapy plans. She was made aware of potential side effects and toxicities of the carboplatin, doctaxel, trastuzumab, and pertuzumab. She has not yet attended chemotherapy school, so we have scheduled her for the next class this upcoming week. She will have a port placed tomorrow. I do not believe she has had a baseline echocardiogram performed yet, so I have ordered this as well.  Finally, we reviewed her antiemetic schedule, and she feels comfortable with every medication listed as well as the indication and potential side effects. These prescriptions were sent to her pharmacy today. At this time we are going to hold off on the ativan as she is already prescribed klonopin and xanax. She will use benadryl or melatonin in addition to to klonopoin. She was made aware of her neulasta injections, including the fact that these will be administered with an onbody injector that she will need to wear until the device is no longer active.   Thedora will return on Monday for her first cycle of treatment. She understands and agrees with this plan. She knows the goal of treatment in her case is cure. She has been encouraged to call with any issues that might arise before her next visit here.  Laurie Panda, NP   09/16/2014 5:26 PM

## 2014-09-17 ENCOUNTER — Ambulatory Visit (HOSPITAL_BASED_OUTPATIENT_CLINIC_OR_DEPARTMENT_OTHER): Payer: BC Managed Care – PPO | Admitting: Anesthesiology

## 2014-09-17 ENCOUNTER — Encounter (HOSPITAL_BASED_OUTPATIENT_CLINIC_OR_DEPARTMENT_OTHER): Payer: Self-pay | Admitting: Anesthesiology

## 2014-09-17 ENCOUNTER — Ambulatory Visit (HOSPITAL_BASED_OUTPATIENT_CLINIC_OR_DEPARTMENT_OTHER)
Admission: RE | Admit: 2014-09-17 | Discharge: 2014-09-17 | Disposition: A | Discharge status: Home / Self Care | Payer: BC Managed Care – PPO | Source: Ambulatory Visit | Attending: General Surgery | Admitting: General Surgery

## 2014-09-17 ENCOUNTER — Ambulatory Visit (HOSPITAL_COMMUNITY): Payer: BC Managed Care – PPO

## 2014-09-17 ENCOUNTER — Encounter (HOSPITAL_BASED_OUTPATIENT_CLINIC_OR_DEPARTMENT_OTHER)
Admission: RE | Disposition: A | Discharge status: Home / Self Care | Payer: Self-pay | Source: Ambulatory Visit | Attending: General Surgery

## 2014-09-17 DIAGNOSIS — C50211 Malignant neoplasm of upper-inner quadrant of right female breast: Secondary | ICD-10-CM | POA: Insufficient documentation

## 2014-09-17 DIAGNOSIS — E780 Pure hypercholesterolemia: Secondary | ICD-10-CM | POA: Diagnosis not present

## 2014-09-17 DIAGNOSIS — Z87891 Personal history of nicotine dependence: Secondary | ICD-10-CM | POA: Insufficient documentation

## 2014-09-17 DIAGNOSIS — K219 Gastro-esophageal reflux disease without esophagitis: Secondary | ICD-10-CM | POA: Diagnosis not present

## 2014-09-17 DIAGNOSIS — F419 Anxiety disorder, unspecified: Secondary | ICD-10-CM | POA: Diagnosis not present

## 2014-09-17 DIAGNOSIS — I252 Old myocardial infarction: Secondary | ICD-10-CM | POA: Insufficient documentation

## 2014-09-17 DIAGNOSIS — M1990 Unspecified osteoarthritis, unspecified site: Secondary | ICD-10-CM | POA: Diagnosis not present

## 2014-09-17 DIAGNOSIS — Z95828 Presence of other vascular implants and grafts: Secondary | ICD-10-CM

## 2014-09-17 DIAGNOSIS — F329 Major depressive disorder, single episode, unspecified: Secondary | ICD-10-CM | POA: Insufficient documentation

## 2014-09-17 HISTORY — PX: PORTACATH PLACEMENT: SHX2246

## 2014-09-17 SURGERY — INSERTION, TUNNELED CENTRAL VENOUS DEVICE, WITH PORT
Anesthesia: General | Site: Chest | Laterality: Left

## 2014-09-17 MED ORDER — HEPARIN (PORCINE) IN NACL 2-0.9 UNIT/ML-% IJ SOLN
INTRAMUSCULAR | Status: DC | PRN
  Administered 2014-09-17: 1 via INTRAVENOUS

## 2014-09-17 MED ORDER — FENTANYL CITRATE 0.05 MG/ML IJ SOLN: 50.0000 ug | INTRAMUSCULAR | Status: DC | PRN

## 2014-09-17 MED ORDER — LIDOCAINE HCL (CARDIAC) 20 MG/ML IV SOLN
INTRAVENOUS | Status: DC | PRN
  Administered 2014-09-17: 50 mg via INTRAVENOUS

## 2014-09-17 MED ORDER — MIDAZOLAM HCL 5 MG/5ML IJ SOLN
INTRAMUSCULAR | Status: DC | PRN
  Administered 2014-09-17: 2 mg via INTRAVENOUS

## 2014-09-17 MED ORDER — DEXAMETHASONE SODIUM PHOSPHATE 4 MG/ML IJ SOLN
INTRAMUSCULAR | Status: DC | PRN
  Administered 2014-09-17: 10 mg via INTRAVENOUS

## 2014-09-17 MED ORDER — SODIUM CHLORIDE 0.9 % IV SOLN
250.0000 mL | INTRAVENOUS | Status: DC | PRN
Start: 2014-09-17 — End: 2014-09-17

## 2014-09-17 MED ORDER — PROPOFOL 10 MG/ML IV BOLUS
INTRAVENOUS | Status: DC | PRN
  Administered 2014-09-17: 200 mg via INTRAVENOUS

## 2014-09-17 MED ORDER — SODIUM CHLORIDE 0.9 % IJ SOLN: 3.0000 mL | INTRAMUSCULAR | Status: DC | PRN

## 2014-09-17 MED ORDER — FENTANYL CITRATE (PF) 100 MCG/2ML IJ SOLN
INTRAMUSCULAR | Status: DC | PRN
Start: 2014-09-17 — End: 2014-09-17
  Administered 2014-09-17: 100 ug via INTRAVENOUS

## 2014-09-17 MED ORDER — MIDAZOLAM HCL 2 MG/2ML IJ SOLN: 1.0000 mg | INTRAMUSCULAR | Status: DC | PRN

## 2014-09-17 MED ORDER — ACETAMINOPHEN 650 MG RE SUPP: 650.0000 mg | RECTAL | Status: DC | PRN

## 2014-09-17 MED ORDER — LIDOCAINE-EPINEPHRINE (PF) 1 %-1:200000 IJ SOLN
INTRAMUSCULAR | Status: DC | PRN
  Administered 2014-09-17: 15 mL

## 2014-09-17 MED ORDER — LACTATED RINGERS IV SOLN
INTRAVENOUS | Status: DC
  Administered 2014-09-17 (×2): via INTRAVENOUS

## 2014-09-17 MED ORDER — FENTANYL CITRATE (PF) 100 MCG/2ML IJ SOLN
INTRAMUSCULAR | Status: AC
  Filled 2014-09-17: qty 6

## 2014-09-17 MED ORDER — SODIUM CHLORIDE 0.9 % IJ SOLN: 3.0000 mL | Freq: Two times a day (BID) | INTRAMUSCULAR | Status: DC

## 2014-09-17 MED ORDER — CIPROFLOXACIN IN D5W 400 MG/200ML IV SOLN
INTRAVENOUS | Status: AC
  Filled 2014-09-17: qty 200

## 2014-09-17 MED ORDER — MIDAZOLAM HCL 2 MG/2ML IJ SOLN
INTRAMUSCULAR | Status: AC
  Filled 2014-09-17: qty 2

## 2014-09-17 MED ORDER — ONDANSETRON HCL 4 MG/2ML IJ SOLN
INTRAMUSCULAR | Status: DC | PRN
  Administered 2014-09-17: 4 mg via INTRAVENOUS

## 2014-09-17 MED ORDER — CIPROFLOXACIN IN D5W 400 MG/200ML IV SOLN
INTRAVENOUS | Status: DC | PRN
  Administered 2014-09-17: 400 mg via INTRAVENOUS

## 2014-09-17 MED ORDER — ACETAMINOPHEN 325 MG PO TABS: 650.0000 mg | ORAL_TABLET | ORAL | Status: DC | PRN

## 2014-09-17 MED ORDER — HEPARIN SOD (PORK) LOCK FLUSH 100 UNIT/ML IV SOLN
INTRAVENOUS | Status: DC | PRN
  Administered 2014-09-17: 500 [IU] via INTRAVENOUS

## 2014-09-17 MED ORDER — SUCCINYLCHOLINE CHLORIDE 20 MG/ML IJ SOLN
INTRAMUSCULAR | Status: AC
  Filled 2014-09-17: qty 2

## 2014-09-17 MED ORDER — CIPROFLOXACIN IN D5W 400 MG/200ML IV SOLN
400.0000 mg | INTRAVENOUS | Status: AC
Start: 2014-09-17 — End: 2014-09-17
  Administered 2014-09-17: 400 mg via INTRAVENOUS

## 2014-09-17 MED ORDER — PHENYLEPHRINE HCL 10 MG/ML IJ SOLN
INTRAMUSCULAR | Status: DC | PRN
  Administered 2014-09-17: 40 ug via INTRAVENOUS

## 2014-09-17 MED ORDER — OXYCODONE HCL 5 MG PO TABS: 5.0000 mg | ORAL_TABLET | ORAL | Status: DC | PRN

## 2014-09-17 MED ORDER — OXYCODONE-ACETAMINOPHEN 5-325 MG PO TABS: 1.0000 | ORAL_TABLET | ORAL | Status: DC | PRN

## 2014-09-17 SURGICAL SUPPLY — 39 items
BAG DECANTER FOR FLEXI CONT (MISCELLANEOUS) ×2 IMPLANT
BLADE HEX COATED 2.75 (ELECTRODE) ×2 IMPLANT
BLADE SURG 11 STRL SS (BLADE) ×2 IMPLANT
BLADE SURG 15 STRL LF DISP TIS (BLADE) ×1 IMPLANT
BLADE SURG 15 STRL SS (BLADE) ×2
CHLORAPREP W/TINT 26ML (MISCELLANEOUS) ×2 IMPLANT
COVER BACK TABLE 60X90IN (DRAPES) ×2 IMPLANT
COVER MAYO STAND STRL (DRAPES) ×2 IMPLANT
DECANTER SPIKE VIAL GLASS SM (MISCELLANEOUS) IMPLANT
DRAPE C-ARM 42X72 X-RAY (DRAPES) ×2 IMPLANT
DRAPE LAPAROTOMY TRNSV 102X78 (DRAPE) ×2 IMPLANT
DRAPE UTILITY XL STRL (DRAPES) ×2 IMPLANT
DRSG TEGADERM 4X4.75 (GAUZE/BANDAGES/DRESSINGS) IMPLANT
ELECT REM PT RETURN 9FT ADLT (ELECTROSURGICAL) ×2
ELECTRODE REM PT RTRN 9FT ADLT (ELECTROSURGICAL) ×1 IMPLANT
GLOVE BIO SURGEON STRL SZ 6 (GLOVE) ×2 IMPLANT
GLOVE BIO SURGEON STRL SZ7 (GLOVE) ×2 IMPLANT
GLOVE BIOGEL PI IND STRL 6.5 (GLOVE) ×1 IMPLANT
GLOVE BIOGEL PI INDICATOR 6.5 (GLOVE) ×1
GOWN STRL REUS W/ TWL LRG LVL3 (GOWN DISPOSABLE) ×2 IMPLANT
GOWN STRL REUS W/TWL 2XL LVL3 (GOWN DISPOSABLE) ×2 IMPLANT
GOWN STRL REUS W/TWL LRG LVL3 (GOWN DISPOSABLE) ×4
KIT PORT POWER 8FR ISP CVUE (Catheter) ×2 IMPLANT
LIQUID BAND (GAUZE/BANDAGES/DRESSINGS) ×2 IMPLANT
NEEDLE HYPO 25X1 1.5 SAFETY (NEEDLE) ×2 IMPLANT
PACK BASIN DAY SURGERY FS (CUSTOM PROCEDURE TRAY) ×2 IMPLANT
PENCIL BUTTON HOLSTER BLD 10FT (ELECTRODE) ×2 IMPLANT
SLEEVE SCD COMPRESS KNEE MED (MISCELLANEOUS) ×2 IMPLANT
SPONGE GAUZE 4X4 12PLY STER LF (GAUZE/BANDAGES/DRESSINGS) IMPLANT
SUT MNCRL AB 4-0 PS2 18 (SUTURE) ×2 IMPLANT
SUT PROLENE 2 0 SH DA (SUTURE) ×4 IMPLANT
SUT VIC AB 3-0 SH 27 (SUTURE) ×2
SUT VIC AB 3-0 SH 27X BRD (SUTURE) ×1 IMPLANT
SUT VICRYL 3-0 CR8 SH (SUTURE) IMPLANT
SYR 5ML LUER SLIP (SYRINGE) ×2 IMPLANT
SYR CONTROL 10ML LL (SYRINGE) ×2 IMPLANT
SYRINGE 10CC LL (SYRINGE) ×2 IMPLANT
TOWEL OR 17X24 6PK STRL BLUE (TOWEL DISPOSABLE) ×2 IMPLANT
TOWEL OR NON WOVEN STRL DISP B (DISPOSABLE) ×2 IMPLANT

## 2014-09-17 NOTE — Op Note (Signed)
PREOPERATIVE DIAGNOSIS:  Right breast cancer, UIQ, cT2N0     POSTOPERATIVE DIAGNOSIS:  Same     PROCEDURE: Left subclavian port placement, Bard   Power Port, MRI safe, 8-French.      SURGEON:  [name], MD      ANESTHESIA:  General   FINDINGS:  Good venous return, easy flush, and tip of the catheter and   SVC 22 cm.      SPECIMEN:  None.      ESTIMATED BLOOD LOSS:  Minimal.      COMPLICATIONS:  None known.      PROCEDURE:  Pt was identified in the holding area and taken to   the operating room, where patient was placed supine on the operating room   table.  General anesthesia was induced.  Patient's arms were tucked and the upper   chest and neck were prepped and draped in sterile fashion.  Time-out was   performed according to the surgical safety check list.  When all was   correct, we continued.   Local anesthetic was administered over this   area at the angle of the clavicle.  The vein was accessed with 2 passes of the needle. There was good venous return and the wire passed easily with no ectopy.   Fluoroscopy was used to confirm that the wire was in the vena cava.      The patient was placed back level and the area for the pocket was anethetized   with local anesthetic.  A 3-cm transverse incision was made with a #15   blade.  Cautery was used to divide the subcutaneous tissues down to the   pectoralis muscle.  An [employer] retractor was used to elevate the skin   while a pocket was created on top of the pectoralis fascia.  The port   was placed into the pocket to confirm that it was of adequate size.  The   catheter was preattached to the port.  The port was then secured to the   pectoralis fascia with four 2-0 Prolene sutures.  These were clamped and   not tied down yet.    The catheter was tunneled through to the wire exit   site.  The catheter was placed along the wire to determine what length it should be to be in the SVC.  The catheter was cut at 22 cm.  The  tunneler sheath and dilator were passed over the wire and the dilator and wire were removed.  The catheter was advanced through the tunneler sheath and the tunneler sheath was pulled away.  Care was taken to keep the catheter in the tunneler sheath as this occurred. This was advanced and the tunneler sheath was removed.  There was good venous   return and easy flush of the catheter.  The Prolene sutures were tied   down to the pectoral fascia.  The skin was reapproximated using 3-0   Vicryl interrupted deep dermal sutures.    Fluoroscopy was used to re-confirm good position of the catheter.  The skin   was then closed using 4-0 Monocryl in a subcuticular fashion.  The port was flushed with concentrated heparin flush as well.  The wounds were then cleaned, dried, and dressed with Dermabond.  The patient was awakened from anesthesia and taken to the PACU in stable condition.  Needle, sponge, and instrument counts were correct.               [name], MD

## 2014-09-17 NOTE — Discharge Instructions (Addendum)
[place] Office Phone Number [phone]   POST OP INSTRUCTIONS  Always review your discharge instruction sheet given to you by the facility where your surgery was performed.  IF YOU HAVE DISABILITY OR FAMILY LEAVE FORMS, YOU MUST BRING THEM TO THE OFFICE FOR PROCESSING.  DO NOT GIVE THEM TO YOUR DOCTOR.  1. A prescription for pain medication may be given to you upon discharge.  Take your pain medication as prescribed, if needed.  If narcotic pain medicine is not needed, then you may take acetaminophen (Tylenol) or ibuprofen (Advil) as needed. 2. Take your usually prescribed medications unless otherwise directed 3. If you need a refill on your pain medication, please contact your pharmacy.  They will contact our office to request authorization.  Prescriptions will not be filled after 5pm or on week-ends. 4. You should eat very light the first 24 hours after surgery, such as soup, crackers, pudding, etc.  Resume your normal diet the day after surgery 5. It is common to experience some constipation if taking pain medication after surgery.  Increasing fluid intake and taking a stool softener will usually help or prevent this problem from occurring.  A mild laxative (Milk of Magnesia or Miralax) should be taken according to package directions if there are no bowel movements after 48 hours. 6. You may shower in 48 hours.  The surgical glue will flake off in 2-3 weeks.   7. ACTIVITIES:  No strenuous activity or heavy lifting for 1 week.   a. You may drive when you no longer are taking prescription pain medication, you can comfortably wear a seatbelt, and you can safely maneuver your car and apply brakes. b. RETURN TO WORK:  _________to be determined.  _____________ Dennis Bast should see your doctor in the office for a follow-up appointment approximately three-four weeks after your surgery.    WHEN TO CALL YOUR DOCTOR: 1. Fever over 101.0 2. Nausea and/or vomiting. 3. Extreme swelling or  bruising. 4. Continued bleeding from incision. 5. Increased pain, redness, or drainage from the incision.  The clinic staff is available to answer your questions during regular business hours.  Please dont hesitate to call and ask to speak to one of the nurses for clinical concerns.  If you have a medical emergency, go to the nearest emergency room or call 911.  A surgeon from [facility] is always on call at the hospital.  For further questions, please visit [email or web address]    Post Anesthesia Home Care Instructions  Activity: Get plenty of rest for the remainder of the day. A responsible adult should stay with you for 24 hours following the procedure.  For the next 24 hours, DO NOT: -Drive a car -Paediatric nurse -Drink alcoholic beverages -Take any medication unless instructed by your physician -Make any legal decisions or sign important papers.  Meals: Start with liquid foods such as gelatin or soup. Progress to regular foods as tolerated. Avoid greasy, spicy, heavy foods. If nausea and/or vomiting occur, drink only clear liquids until the nausea and/or vomiting subsides. Call your physician if vomiting continues.  Special Instructions/Symptoms: Your throat may feel dry or sore from the anesthesia or the breathing tube placed in your throat during surgery. If this causes discomfort, gargle with warm salt water. The discomfort should disappear within 24 hours.  If you had a scopolamine patch placed behind your ear for the management of post- operative nausea and/or vomiting:  1. The medication in the patch is effective for 72 hours, after  which it should be removed.  Wrap patch in a tissue and discard in the trash. Wash hands thoroughly with soap and water. 2. You may remove the patch earlier than 72 hours if you experience unpleasant side effects which may include dry mouth, dizziness or visual disturbances. 3. Avoid touching the patch. Wash your hands with  soap and water after contact with the patch.    Incentive Spirometer An incentive spirometer is a tool that can help keep your lungs clear and active. This tool measures how well you are filling your lungs with each breath. Taking long, deep breaths may help reverse or decrease the chance of developing breathing (pulmonary) problems (especially infection) following:  Surgery of the chest or abdomen.  Surgery if you have a history of smoking or a lung problem.  A long period of time when you are unable to move or be active. BEFORE THE PROCEDURE   If the spirometer includes an indicator to show your best effort, your nurse or respiratory therapist will set it to a desired goal.  If possible, sit up straight or lean slightly forward. Try not to slouch.  Hold the incentive spirometer in an upright position. INSTRUCTIONS FOR USE  1. Sit on the edge of your bed if possible, or sit up as far as you can in bed or on a chair. 2. Hold the incentive spirometer in an upright position. 3. Breathe out normally. 4. Place the mouthpiece in your mouth and seal your lips tightly around it. 5. Breathe in slowly and as deeply as possible, raising the piston or the ball toward the top of the column. 6. Hold your breath for 3-5 seconds or for as long as possible. Allow the piston or ball to fall to the bottom of the column. 7. Remove the mouthpiece from your mouth and breathe out normally. 8. Rest for a few seconds and repeat Steps 1 through 7 at least 10 times every 1-2 hours when you are awake. Take your time and take a few normal breaths between deep breaths. 9. The spirometer may include an indicator to show your best effort. Use the indicator as a goal to work toward during each repetition. 10. After each set of 10 deep breaths, practice coughing to be sure your lungs are clear. If you have an incision (the cut made at the time of surgery), support your incision when coughing by placing a pillow or  rolled-up towels firmly against it. Once you are able to get out of bed, walk around indoors and cough well. You may stop using the incentive spirometer when instructed by your caregiver.  RISKS AND COMPLICATIONS  Breathing too quickly may cause dizziness. At an extreme, this could cause you to pass out. Take your time so you do not get dizzy or light-headed.  If you are in pain, you may need to take or ask for pain medication before doing incentive spirometry. It is harder to take a deep breath if you are having pain. AFTER USE  Rest and breathe slowly and easily.  It can be helpful to keep a log of your progress. Your caregiver can provide you with a simple table to help with this. If you are using the spirometer at home, follow these instructions: [place] IF:   You are having difficultly using the spirometer.  You have trouble using the spirometer as often as instructed.  Your pain medication is not giving enough relief while using the spirometer.  You develop fever  of 100.14F (38.1C) or higher. SEEK IMMEDIATE MEDICAL CARE IF:   You cough up bloody sputum that had not been present before.  You develop fever of 102F (38.9C) or greater.  You develop worsening pain at or near the incision site. MAKE SURE YOU:   Understand these instructions.  Will watch your condition.  Will get help right away if you are not doing well or get worse. Document Released: 09/27/2006 Document Revised: 10/01/2013 Document Reviewed: 11/28/2006 [facility] Patient Information 2015 [place], [place]. This information is not intended to replace advice given to you by your health care provider. Make sure you discuss any questions you have with your health care provider.

## 2014-09-17 NOTE — Transfer of Care (Signed)
Immediate Anesthesia Transfer of Care Note  Patient: Beth Rice  Procedure(s) Performed: Procedure(s): INSERTION PORT-A-CATH (Left)  Patient Location: PACU  Anesthesia Type:General  Level of Consciousness: awake and patient cooperative  Airway & Oxygen Therapy: Patient Spontanous Breathing and Patient connected to face mask oxygen  Post-op Assessment: Report given to RN and Post -op Vital signs reviewed and stable  Post vital signs: Reviewed and stable  Last Vitals:  Filed Vitals:   09/17/14 0728  BP: 99/68  Pulse: 79  Temp: 36.8 C  Resp: 16    Complications: No apparent anesthesia complications

## 2014-09-17 NOTE — Anesthesia Procedure Notes (Signed)
Procedure Name: LMA Insertion Date/Time: 09/17/2014 9:00 AM Performed by: Toula Moos L Pre-anesthesia Checklist: Patient identified, Emergency Drugs available, Suction available, Patient being monitored and Timeout performed Patient Re-evaluated:Patient Re-evaluated prior to inductionOxygen Delivery Method: Circle System Utilized Preoxygenation: Pre-oxygenation with 100% oxygen Intubation Type: IV induction Ventilation: Mask ventilation without difficulty LMA: LMA inserted LMA Size: 4.0 Number of attempts: 1 Airway Equipment and Method: Bite block Placement Confirmation: positive ETCO2 Tube secured with: Tape Dental Injury: Teeth and Oropharynx as per pre-operative assessment

## 2014-09-17 NOTE — Anesthesia Postprocedure Evaluation (Signed)
  Anesthesia Post-op Note  Patient: Staci Righter  Procedure(s) Performed: Procedure(s): INSERTION PORT-A-CATH (Left)  Patient Location: PACU  Anesthesia Type: General   Level of Consciousness: awake, alert  and oriented  Airway and Oxygen Therapy: Patient Spontanous Breathing  Post-op Pain: mild  Post-op Assessment: Post-op Vital signs reviewed  Post-op Vital Signs: Reviewed  Last Vitals:  Filed Vitals:   09/17/14 1200  BP: 105/67  Pulse: 74  Temp: 36.7 C  Resp: 16    Complications: No apparent anesthesia complications

## 2014-09-17 NOTE — Addendum Note (Signed)
Addended by: Marcelino Duster on: 09/17/2014 08:36 AM   Modules accepted: Orders

## 2014-09-17 NOTE — Anesthesia Preprocedure Evaluation (Addendum)
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 MI) Rhythm:Regular Rate:Normal     Neuro/Psych    GI/Hepatic GERD-  Medicated and Controlled,  Endo/Other    Renal/GU      Musculoskeletal   Abdominal   Peds  Hematology   Anesthesia Other Findings   Reproductive/Obstetrics                           Anesthesia Physical Anesthesia Plan  ASA: II  Anesthesia Plan: General   Post-op Pain Management:    Induction: Intravenous  Airway Management Planned: LMA  Additional Equipment:   Intra-op Plan:   Post-operative Plan: Extubation in OR  Informed Consent: I have reviewed the patients History and Physical, chart, labs and discussed the procedure including the risks, benefits and alternatives for the proposed anesthesia with the patient or authorized representative who has indicated his/her understanding and acceptance.   Dental advisory given  Plan Discussed with: CRNA, Anesthesiologist and Surgeon  Anesthesia Plan Comments:         Anesthesia Quick Evaluation

## 2014-09-17 NOTE — Interval H&P Note (Signed)
History and Physical Interval Note:  09/17/2014 8:32 AM  Beth Rice  has presented today for surgery, with the diagnosis of RIGHT BREAST CANCER  The various methods of treatment have been discussed with the patient and family. After consideration of risks, benefits and other options for treatment, the patient has consented to  Procedure(s): INSERTION PORT-A-CATH (N/A) as a surgical intervention .  The patient's history has been reviewed, patient examined, no change in status, stable for surgery.  I have reviewed the patient's chart and labs.  Questions were answered to the patient's satisfaction.     Beth Rice

## 2014-09-17 NOTE — H&P (Signed)
Beth Rice 09/11/2014 7:48 AM Location: [facility] Patient #: [id number] DOB: 01/26/1956 Undefined / Language: Beth Rice / Race: Undefined Female  History of Present Illness [name] MD; 09/11/2014 3:59 PM) The patient is a 59 year old female who presents with breast cancer. Patient is referred by [name] for consultation with a new diagnosis of right breast cancer. She presented with a palpable mass around 4 weeks ago. She subsequently underwent diagnostic mammogram and ultrasound. She was seen to have an area of calcifications approximately 2.5 cm in the upper inner quadrant of the right breast. On ultrasound, this area was 1.8 cm and it appeared to be an irregular mass. 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 There are 2 ratio is 2.7 and Ki-67 was 81%. She subsequently underwent an MRI which demonstrated 2.3 x 2.3 x 2.1 cm mass in the posterior aspect of the right upper breast. This appeared adjacent to the pectoralis major but not invading the pectoralis major. She does not have a history of cancer in her family. She herself has not had any diagnoses of cancer. She is a former smoker. She quit in January of this year. Since approximately 8 beers per week. She does not use illicit drugs. She is due for a colonoscopy this year but did have one in 2006. She also has had a bone density study and a Pap smear recently. She had menarche at age 70. She last had menses in 2005. She has not used hormone replacement or hormonal contraception. She is nulliparous.   Other Problems Beth Rice [place], [place]; 09/11/2014 7:48 AM) Anxiety Disorder Arthritis Back Pain Breast Cancer Chest pain Depression Hypercholesterolemia Lump In Breast  Past Surgical History Beth Rice, [name]; 09/11/2014 7:48 AM) Breast Biopsy Right. Colon Polyp Removal -  Colonoscopy Oral Surgery Tonsillectomy  Diagnostic Studies History Beth Rice [name], [place]; 09/11/2014 7:48 AM) Colonoscopy >10 years ago Pap Smear 1-5 years ago  Social History Beth Rice Beth Rice, [name]; 09/11/2014 7:48 AM) Alcohol use Moderate alcohol use. Caffeine use Coffee. No drug use Tobacco use Former smoker.  Family History Beth Rice [place], [place]; 09/11/2014 7:48 AM) Arthritis Family Members In General, Mother. Colon Cancer Family Members In General. Colon Polyps Sister. Heart Disease Mother. Hypertension Sister. Ovarian Cancer Family Members In General. Seizure disorder Father. Thyroid problems Family Members In General.  Pregnancy / Birth History Beth Rice, [place]; 09/11/2014 7:48 AM) Age at menarche 62 years. Age of menopause 52-55 Gravida 0 Irregular periods Para 0  Review of Systems Beth Rice [name]; 09/11/2014 7:48 AM) General Present- Appetite Loss, Fatigue, Night Sweats and Weight Loss. Not Present- Chills, Fever and Weight Gain. Skin Present- Dryness. Not Present- Change in Wart/Mole, Hives, Jaundice, New Lesions, Non-Healing Wounds, Rash and Ulcer. HEENT Present- Seasonal Allergies and Wears glasses/contact lenses. Not Present- Earache, Hearing Loss, Hoarseness, Nose Bleed, Oral Ulcers, Ringing in the Ears, Sinus Pain, Sore Throat, Visual Disturbances and Yellow Eyes. Respiratory Present- Snoring. Not Present- Bloody sputum, Chronic Cough, Difficulty Breathing and Wheezing. Breast Present- Breast Mass and Breast Pain. Not Present- Nipple Discharge and Skin Changes. Cardiovascular Not Present- Chest Pain, Difficulty Breathing Lying Down, Leg Cramps, Palpitations, Rapid Heart Rate, Shortness of Breath and Swelling of Extremities. Gastrointestinal Not Present- Abdominal Pain, Bloating, Bloody Stool, Change in Bowel Habits, Chronic diarrhea, Constipation, Difficulty Swallowing, Excessive gas, Gets full quickly at meals, Hemorrhoids, Indigestion,  Nausea, Rectal Pain and Vomiting. Female Genitourinary Not Present- Frequency,  Nocturia, Painful Urination, Pelvic Pain and Urgency. Musculoskeletal Present- Back Pain and Joint Pain. Not Present- Joint Stiffness, Muscle Pain, Muscle Weakness and Swelling of Extremities. Neurological Present- Trouble walking. Not Present- Decreased Memory, Fainting, Headaches, Numbness, Seizures, Tingling, Tremor and Weakness. Psychiatric Present- Anxiety, Change in Sleep Pattern and Depression. Not Present- Bipolar, Fearful and Frequent crying. Endocrine Present- Cold Intolerance and Hot flashes. Not Present- Excessive Hunger, Hair Changes, Heat Intolerance and New Diabetes. Hematology Not Present- Easy Bruising, Excessive bleeding, Gland problems, HIV and Persistent Infections.   Physical Exam [name] MD; 09/11/2014 4:01 PM) General Mental Status-Alert. General Appearance-Consistent with stated age. Hydration-Well hydrated. Voice-Normal.  Head and Neck Head-normocephalic, atraumatic with no lesions or palpable masses. Trachea-midline. Thyroid Gland Characteristics - normal size and consistency.  Eye Eyeball - Bilateral-Extraocular movements intact. Sclera/Conjunctiva - Bilateral-No scleral icterus.  Chest and Lung Exam Chest and lung exam reveals -quiet, even and easy respiratory effort with no use of accessory muscles and on auscultation, normal breath sounds, no adventitious sounds and normal vocal resonance. Inspection Chest Wall - Normal. Back - normal.  Breast Note: The patient has a palpable mass in the upper central right breast. This is mobile. It is visible with a change in contour with the patient seated and lying. There is no palpable lymphadenopathy. She has no nipple retraction or skin dimpling. There is no nipple discharge.   Cardiovascular Cardiovascular examination reveals -normal heart sounds, regular rate and rhythm with no murmurs and normal pedal  pulses bilaterally.  Abdomen Inspection Inspection of the abdomen reveals - No Hernias. Palpation/Percussion Palpation and Percussion of the abdomen reveal - Soft, Non Tender, No Rebound tenderness, No Rigidity (guarding) and No hepatosplenomegaly. Auscultation Auscultation of the abdomen reveals - Bowel sounds normal.  Neurologic Neurologic evaluation reveals -alert and oriented x 3 with no impairment of recent or remote memory. Mental Status-Normal.  Musculoskeletal Global Assessment -Note: no gross deformities.  Normal Exam - Left-Upper Extremity Strength Normal and Lower Extremity Strength Normal. Normal Exam - Right-Upper Extremity Strength Normal and Lower Extremity Strength Normal.  Lymphatic Head & Neck  General Head & Neck Lymphatics: Bilateral - Description - Normal. Axillary  General Axillary Region: Bilateral - Description - Normal. Tenderness - Non Tender. Femoral & Inguinal  Generalized Femoral & Inguinal Lymphatics: Bilateral - Description - No Generalized lymphadenopathy.    Assessment & Plan [name] MD; 09/11/2014 4:05 PM) CARCINOMA OF RIGHT BREAST UPPER INNER QUADRANT (174.2  C50.211) Impression: Patient appears to have a T2 N0 M0 right breast cancer. The content of the Herceptin positivity and essentially hormone-negative tumor, she is a good candidate for neoadjuvant chemotherapy. I think this will maximize our chances for a negative margin on the pectoralis muscle. Also, she is much less likely to be lymph node positive following neoadjuvant chemotherapy.  I reviewed the risks of Port-A-Cath with the patient. I showed the patient an example of a Port-A-Cath and described the method for insertion. I also reviewed the risk of Port-A-Cath insertion including but not limited to pneumothorax, malfunction, malposition, bleeding, infection, possible need for additional surgeries, etc. We will do this next week. She is scheduled to start chemotherapy  a week after that.  I discussed the importance of remaining off tobacco. There there is a much lower risk of infection and wound healing issues when patients have stopped tobacco use.  60 min spent in evaluation, examination, counseling, and coordination of care. >50% spent in counseling. Current Plans  Schedule for Surgery   Signed by  [name], MD (09/11/2014 4:06 PM)

## 2014-09-18 ENCOUNTER — Ambulatory Visit (HOSPITAL_COMMUNITY): Payer: BC Managed Care – PPO

## 2014-09-18 ENCOUNTER — Encounter (HOSPITAL_BASED_OUTPATIENT_CLINIC_OR_DEPARTMENT_OTHER): Payer: Self-pay | Admitting: General Surgery

## 2014-09-19 ENCOUNTER — Other Ambulatory Visit (HOSPITAL_BASED_OUTPATIENT_CLINIC_OR_DEPARTMENT_OTHER): Payer: BC Managed Care – PPO

## 2014-09-19 ENCOUNTER — Ambulatory Visit (HOSPITAL_BASED_OUTPATIENT_CLINIC_OR_DEPARTMENT_OTHER): Payer: BC Managed Care – PPO | Admitting: Genetic Counselor

## 2014-09-19 ENCOUNTER — Encounter: Payer: Self-pay | Admitting: *Deleted

## 2014-09-19 ENCOUNTER — Other Ambulatory Visit: Payer: BC Managed Care – PPO

## 2014-09-19 DIAGNOSIS — Z8041 Family history of malignant neoplasm of ovary: Secondary | ICD-10-CM | POA: Diagnosis not present

## 2014-09-19 DIAGNOSIS — C50211 Malignant neoplasm of upper-inner quadrant of right female breast: Secondary | ICD-10-CM

## 2014-09-19 DIAGNOSIS — C50919 Malignant neoplasm of unspecified site of unspecified female breast: Secondary | ICD-10-CM | POA: Insufficient documentation

## 2014-09-19 DIAGNOSIS — Z800 Family history of malignant neoplasm of digestive organs: Secondary | ICD-10-CM

## 2014-09-19 DIAGNOSIS — C50811 Malignant neoplasm of overlapping sites of right female breast: Secondary | ICD-10-CM

## 2014-09-19 DIAGNOSIS — C50911 Malignant neoplasm of unspecified site of right female breast: Secondary | ICD-10-CM

## 2014-09-19 LAB — CBC WITH DIFFERENTIAL/PLATELET
BASO%: 0.8 % (ref 0.0–2.0)
BASOS ABS: 0.1 10*3/uL (ref 0.0–0.1)
EOS ABS: 0.2 10*3/uL (ref 0.0–0.5)
EOS%: 1.7 % (ref 0.0–7.0)
HCT: 43 % (ref 34.8–46.6)
HEMOGLOBIN: 14.2 g/dL (ref 11.6–15.9)
LYMPH#: 2 10*3/uL (ref 0.9–3.3)
LYMPH%: 22.6 % (ref 14.0–49.7)
MCH: 32.4 pg (ref 25.1–34.0)
MCHC: 33.1 g/dL (ref 31.5–36.0)
MCV: 98 fL (ref 79.5–101.0)
MONO#: 0.6 10*3/uL (ref 0.1–0.9)
MONO%: 6.4 % (ref 0.0–14.0)
NEUT%: 68.5 % (ref 38.4–76.8)
NEUTROS ABS: 6.1 10*3/uL (ref 1.5–6.5)
Platelets: 272 10*3/uL (ref 145–400)
RBC: 4.39 10*6/uL (ref 3.70–5.45)
RDW: 12.9 % (ref 11.2–14.5)
WBC: 8.9 10*3/uL (ref 3.9–10.3)

## 2014-09-19 LAB — COMPREHENSIVE METABOLIC PANEL (CC13)
ALBUMIN: 4 g/dL (ref 3.5–5.0)
ALT: 15 U/L (ref 0–55)
AST: 21 U/L (ref 5–34)
Alkaline Phosphatase: 74 U/L (ref 40–150)
Anion Gap: 14 mEq/L — ABNORMAL HIGH (ref 3–11)
BILIRUBIN TOTAL: 0.32 mg/dL (ref 0.20–1.20)
BUN: 5.5 mg/dL — ABNORMAL LOW (ref 7.0–26.0)
CALCIUM: 9.4 mg/dL (ref 8.4–10.4)
CO2: 26 mEq/L (ref 22–29)
CREATININE: 0.7 mg/dL (ref 0.6–1.1)
Chloride: 105 mEq/L (ref 98–109)
EGFR: 89 mL/min/{1.73_m2} — AB (ref >90)
Glucose: 95 mg/dl (ref 70–140)
Potassium: 4.2 mEq/L (ref 3.5–5.1)
Sodium: 144 mEq/L (ref 136–145)
Total Protein: 7.1 g/dL (ref 6.4–8.3)

## 2014-09-19 NOTE — Progress Notes (Signed)
[facility] Patient Visit  REFERRING PROVIDER: Darlyne Russian, MD [address] [place], [place] [address]  PRIMARY PROVIDER:  [name], MD  PRIMARY REASON FOR VISIT:  1. Breast cancer, right   2. Family history of malignant neoplasm of ovary   3. Family history of malignant neoplasm of gastrointestinal tract     HISTORY OF PRESENT ILLNESS:   Beth Rice, a 59 y.o. female, was seen for a [facility] cancer genetics consultation at the request of Dr. Everlene Farrier due to a personal and family history of cancer.  Ms. Lackman presents to clinic today to discuss the possibility of a hereditary predisposition to cancer, genetic testing, and to further clarify her future cancer risks, as well as potential cancer risks for family members. She was accompanied to clinic by her sister, [name].  CANCER HISTORY:    Breast cancer of upper-inner quadrant of right female breast   08/27/2014 Breast US an irregularly marginated hypoechoic mass with shadowing located within the right breast at 12:30 o'clock position 8 cm from the nipple measuring 1.8 x 1.7 x 1.7 cm in size. This is suspicious for invasive mammary carcinoma.    09/02/2014 Initial Biopsy Breast, right, needle core biopsy, upper 12-12:30 o'clock - INVASIVE DUCTAL CARCINOMA. - DUCTAL CARCINOMA IN SITU.   09/03/2014 Receptors her2 Estrogen Receptor: 2%, POSITIVE, MODERATE STAINING INTENSITY Progesterone Receptor: 0%, NEGATIVE Proliferation Marker Ki67: 81%,HER2/NEU BY CISH - POSITIVE.   09/03/2014 Initial Diagnosis Breast cancer of upper-inner quadrant of right female breast   09/04/2014 Breast MRI Biopsy proven malignancy in the superior posterior right breast at the approximate 12:30 position measures up to 2.3 cm, and corresponds to the mass and associated calcifications seen mammographically spanning approximately 2.5 cm.    Past Medical History  Diagnosis Date   Anxiety    Elevated cholesterol     Depression    Chest pain     a. 09/2008 Cath: ER 60%, LM nl, LAD 32m, 23m/d, LCX nondom, nl, RCA dom, 57m, PDA/PLA nl.   Tobacco abuse    H/O degenerative disc disease    Myocardial infarction     "I think I had a heart attack in 2010"   Headache     "frequency depends on the weather" (06/04/2014)   Arthritis     "neck" (06/04/2014)   Chronic lower back pain    Gastroesophageal reflux disease     H/O   Hot flashes     Past Surgical History  Procedure Laterality Date   Ovarian cyst removal  1980's?   Tonsillectomy     Cardiac catheterization  10/15/2008    noncritical mid & distal third anterior descending disease   Portacath placement Left 09/17/2014    Procedure: INSERTION PORT-A-CATH;  Surgeon: [name], MD;  Location: [facility];  Service: General;  Laterality: Left;    History   Social History   Marital Status: Single    Spouse Name: N/A   Number of Children: N/A   Years of Education: N/A   Social History Main Topics   Smoking status: Former Smoker -- 1.00 packs/day for 20 years    Types: Cigarettes    Quit date: 06/21/2014   Smokeless tobacco: Never Used   Alcohol Use: 1.8 oz/week    3 Cans of beer, 0 Standard drinks or equivalent per week   Drug Use: No   Sexual Activity: No     Comment: intercourse age 36, sexual  partners less than 5   Other Topics Concern   Not on file   Social History Narrative   Lives in [place] with her 4 dogs.  She does not routinely exercise as activity is limited by chronic back pain.     FAMILY HISTORY:  During the visit, a 4-generation pedigree was obtained. A copy of the pedigree with be scanned into Epic under the Media tab. Significant family history diagnoses include the following: Family History  Problem Relation Age of Onset   Hypertension Sister    Heart disease Mother    Alzheimer's disease Mother    Cerebral aneurysm Father    Mental illness Father    Cancer Maternal Aunt 27     ovarian   Cancer Other 61    mat great aunt (through [facility]) with colorectal cancer   Cancer Other 40    mat great uncle (through [facility]) with prostate cancer   Cancer Cousin 67    paternal female first cousin through an uncle with pancreatic cancer   Ms. Fichera's ancestry is of Caucasian descent. There is no known Jewish ancestry or consanguinity.  GENETIC COUNSELING ASSESSMENT:  Ms. Negrette is a 59 y.o. female with a personal and family history of cancer suggestive of a hereditary predisposition to cancer. We, therefore, discussed and recommended the following at today's visit.   DISCUSSION:  We reviewed the characteristics, features and inheritance patterns of hereditary cancer syndromes. We also discussed genetic testing, including the appropriate family members to test, the process of testing, insurance coverage and turn-around-time for results. We discussed the implications of a negative, positive and/or variant of uncertain significant result. We recommended Ms. Norell pursue genetic testing for the OvaNext gene panel. The OvaNext gene panel offered by [employer] includes sequencing and rearrangement analysis for the following 24 genes:ATM, BARD1, BRCA1, BRCA2, BRIP1, CDH1, CHEK2, EPCAM, MLH1, MRE11A, MSH2, MSH6, MUTYH, NBN, NF1, PALB2, PMS2, PTEN, RAD50, RAD51C, RAD51D, SMARCA4, STK11, and TP53.  PLAN:  Based on our above recommendation, Ms. Turvey wished to pursue genetic testing and the blood sample was drawn and will be sent to [facility] for analysis. Results should be available within approximately 4 weeks time, at which point they will be disclosed by telephone to Ms. Marlette, as will any additional recommendations warranted by these results. Lastly, we encouraged Ms. Ybarra to remain in contact with cancer genetics annually so that we can continuously update the family history and inform her of any changes in cancer genetics and testing that may be of benefit for  this family.   Ms.  Liptak questions were answered to her satisfaction today. Our contact information was provided should additional questions or concerns arise. Thank you for the referral and allowing [place] to share in the care of your patient.   Catherine A. Fine, MS, CGC Certified Psychologist, [email or web address] phone: [phone]  The patient was seen for a total of 45 minutes in face-to-face genetic counseling.  This patient was discussed with Dr. Jana Hakim who agrees with the above.    ______________________________________________________________________ For Office Staff:  Number of people involved in session including genetic counselor: 3 Was an intern or student involved with case: not applicable

## 2014-09-20 ENCOUNTER — Ambulatory Visit (HOSPITAL_COMMUNITY)
Admission: RE | Admit: 2014-09-20 | Discharge: 2014-09-20 | Disposition: A | Discharge status: Home / Self Care | Payer: BC Managed Care – PPO | Source: Ambulatory Visit | Attending: Nurse Practitioner | Admitting: Nurse Practitioner

## 2014-09-20 DIAGNOSIS — C50211 Malignant neoplasm of upper-inner quadrant of right female breast: Secondary | ICD-10-CM

## 2014-09-20 DIAGNOSIS — C50919 Malignant neoplasm of unspecified site of unspecified female breast: Secondary | ICD-10-CM | POA: Diagnosis not present

## 2014-09-20 DIAGNOSIS — Z87891 Personal history of nicotine dependence: Secondary | ICD-10-CM | POA: Insufficient documentation

## 2014-09-20 NOTE — Progress Notes (Signed)
Echocardiogram 2D Echocardiogram has been performed.  Natalyah Cummiskey 09/20/2014, 10:30 AM

## 2014-09-21 ENCOUNTER — Other Ambulatory Visit: Payer: Self-pay | Admitting: Family Medicine

## 2014-09-23 ENCOUNTER — Ambulatory Visit (HOSPITAL_BASED_OUTPATIENT_CLINIC_OR_DEPARTMENT_OTHER): Payer: BC Managed Care – PPO | Admitting: Nurse Practitioner

## 2014-09-23 ENCOUNTER — Ambulatory Visit (HOSPITAL_BASED_OUTPATIENT_CLINIC_OR_DEPARTMENT_OTHER): Payer: BC Managed Care – PPO

## 2014-09-23 ENCOUNTER — Telehealth: Payer: Self-pay | Admitting: Radiology

## 2014-09-23 ENCOUNTER — Other Ambulatory Visit (HOSPITAL_BASED_OUTPATIENT_CLINIC_OR_DEPARTMENT_OTHER): Payer: BC Managed Care – PPO

## 2014-09-23 ENCOUNTER — Encounter: Payer: Self-pay | Admitting: Nurse Practitioner

## 2014-09-23 ENCOUNTER — Other Ambulatory Visit: Payer: Self-pay | Admitting: *Deleted

## 2014-09-23 ENCOUNTER — Encounter: Payer: Self-pay | Admitting: Oncology

## 2014-09-23 ENCOUNTER — Other Ambulatory Visit: Payer: Self-pay | Admitting: Nurse Practitioner

## 2014-09-23 VITALS — BP 123/63 | HR 63 | Temp 98.7°F | Resp 18

## 2014-09-23 DIAGNOSIS — T7840XA Allergy, unspecified, initial encounter: Secondary | ICD-10-CM | POA: Insufficient documentation

## 2014-09-23 DIAGNOSIS — Z5111 Encounter for antineoplastic chemotherapy: Secondary | ICD-10-CM

## 2014-09-23 DIAGNOSIS — R232 Flushing: Secondary | ICD-10-CM | POA: Diagnosis not present

## 2014-09-23 DIAGNOSIS — C50811 Malignant neoplasm of overlapping sites of right female breast: Secondary | ICD-10-CM

## 2014-09-23 DIAGNOSIS — Z5112 Encounter for antineoplastic immunotherapy: Secondary | ICD-10-CM

## 2014-09-23 DIAGNOSIS — C50211 Malignant neoplasm of upper-inner quadrant of right female breast: Secondary | ICD-10-CM

## 2014-09-23 DIAGNOSIS — Z5189 Encounter for other specified aftercare: Secondary | ICD-10-CM

## 2014-09-23 LAB — COMPREHENSIVE METABOLIC PANEL (CC13)
ALBUMIN: 3.8 g/dL (ref 3.5–5.0)
ALT: 13 U/L (ref 0–55)
AST: 17 U/L (ref 5–34)
Alkaline Phosphatase: 69 U/L (ref 40–150)
Anion Gap: 13 mEq/L — ABNORMAL HIGH (ref 3–11)
BUN: 7.5 mg/dL (ref 7.0–26.0)
CHLORIDE: 104 meq/L (ref 98–109)
CO2: 21 mEq/L — ABNORMAL LOW (ref 22–29)
Calcium: 9.2 mg/dL (ref 8.4–10.4)
Creatinine: 0.8 mg/dL (ref 0.6–1.1)
EGFR: 82 mL/min/{1.73_m2} — ABNORMAL LOW (ref >90)
Glucose: 164 mg/dl — ABNORMAL HIGH (ref 70–140)
Potassium: 4.1 mEq/L (ref 3.5–5.1)
SODIUM: 139 meq/L (ref 136–145)
TOTAL PROTEIN: 7.1 g/dL (ref 6.4–8.3)
Total Bilirubin: <0.2 mg/dL (ref 0.20–1.20)

## 2014-09-23 LAB — CBC WITH DIFFERENTIAL/PLATELET
BASO%: 0 % (ref 0.0–2.0)
Basophils Absolute: 0 10*3/uL (ref 0.0–0.1)
EOS%: 0 % (ref 0.0–7.0)
Eosinophils Absolute: 0 10*3/uL (ref 0.0–0.5)
HEMATOCRIT: 39.8 % (ref 34.8–46.6)
HGB: 13.8 g/dL (ref 11.6–15.9)
LYMPH%: 5.9 % — AB (ref 14.0–49.7)
MCH: 33 pg (ref 25.1–34.0)
MCHC: 34.7 g/dL (ref 31.5–36.0)
MCV: 95.2 fL (ref 79.5–101.0)
MONO#: 0.5 10*3/uL (ref 0.1–0.9)
MONO%: 6.1 % (ref 0.0–14.0)
NEUT#: 7.7 10*3/uL — ABNORMAL HIGH (ref 1.5–6.5)
NEUT%: 88 % — ABNORMAL HIGH (ref 38.4–76.8)
Platelets: 208 10*3/uL (ref 145–400)
RBC: 4.18 10*6/uL (ref 3.70–5.45)
RDW: 12.5 % (ref 11.2–14.5)
WBC: 8.8 10*3/uL (ref 3.9–10.3)
lymph#: 0.5 10*3/uL — ABNORMAL LOW (ref 0.9–3.3)

## 2014-09-23 MED ORDER — HEPARIN SOD (PORK) LOCK FLUSH 100 UNIT/ML IV SOLN
500.0000 [IU] | Freq: Once | INTRAVENOUS | Status: AC | PRN
  Administered 2014-09-23: 500 [IU]
  Filled 2014-09-23: qty 5

## 2014-09-23 MED ORDER — ACETAMINOPHEN 325 MG PO TABS
650.0000 mg | ORAL_TABLET | Freq: Once | ORAL | Status: AC
  Administered 2014-09-23: 650 mg via ORAL

## 2014-09-23 MED ORDER — DIPHENHYDRAMINE HCL 25 MG PO CAPS
ORAL_CAPSULE | ORAL | Status: AC
  Filled 2014-09-23: qty 2

## 2014-09-23 MED ORDER — SODIUM CHLORIDE 0.9 % IV SOLN
Freq: Once | INTRAVENOUS | Status: AC
  Administered 2014-09-23: 14:00:00 via INTRAVENOUS
  Filled 2014-09-23: qty 8

## 2014-09-23 MED ORDER — DIPHENHYDRAMINE HCL 25 MG PO CAPS
25.0000 mg | ORAL_CAPSULE | Freq: Once | ORAL | Status: AC
  Administered 2014-09-23: 25 mg via ORAL

## 2014-09-23 MED ORDER — PEGFILGRASTIM 6 MG/0.6ML ~~LOC~~ PSKT
6.0000 mg | PREFILLED_SYRINGE | Freq: Once | SUBCUTANEOUS | Status: AC
  Administered 2014-09-23: 6 mg via SUBCUTANEOUS
  Filled 2014-09-23: qty 0.6

## 2014-09-23 MED ORDER — ACETAMINOPHEN 325 MG PO TABS
ORAL_TABLET | ORAL | Status: AC
  Filled 2014-09-23: qty 2

## 2014-09-23 MED ORDER — SODIUM CHLORIDE 0.9 % IV SOLN
461.5000 mg | Freq: Once | INTRAVENOUS | Status: AC
  Administered 2014-09-23: 460 mg via INTRAVENOUS
  Filled 2014-09-23: qty 46

## 2014-09-23 MED ORDER — TRASTUZUMAB CHEMO INJECTION 440 MG
8.0000 mg/kg | Freq: Once | INTRAVENOUS | Status: AC
  Administered 2014-09-23: 462 mg via INTRAVENOUS
  Filled 2014-09-23: qty 22

## 2014-09-23 MED ORDER — SODIUM CHLORIDE 0.9 % IV SOLN
Freq: Once | INTRAVENOUS | Status: AC
  Administered 2014-09-23: 09:00:00 via INTRAVENOUS

## 2014-09-23 MED ORDER — DOCETAXEL CHEMO INJECTION 160 MG/16ML
75.0000 mg/m2 | Freq: Once | INTRAVENOUS | Status: AC
  Administered 2014-09-23: 120 mg via INTRAVENOUS
  Filled 2014-09-23: qty 12

## 2014-09-23 MED ORDER — SODIUM CHLORIDE 0.9 % IV SOLN
840.0000 mg | Freq: Once | INTRAVENOUS | Status: AC
  Administered 2014-09-23: 840 mg via INTRAVENOUS
  Filled 2014-09-23: qty 28

## 2014-09-23 MED ORDER — SODIUM CHLORIDE 0.9 % IJ SOLN
10.0000 mL | INTRAMUSCULAR | Status: DC | PRN
  Administered 2014-09-23: 10 mL
  Filled 2014-09-23: qty 10

## 2014-09-23 NOTE — Progress Notes (Signed)
Enrolled pt in the [employer] program.  Faxed signed form and activated card today.  °

## 2014-09-23 NOTE — Progress Notes (Signed)
SYMPTOM MANAGEMENT CLINIC   HPI: Beth Rice 59 y.o. female diagnosed with breast cancer.  Presents today to the cancer Center to initiate Taxotere/carboplatinum/Herceptin/Perjeta chemotherapy regimen.   Patient presents to the cancer Center today to receive her first cycle of Taxotere/carboplatinum/Herceptin/Perjeta chemotherapy regimen.  Patient had received approximately 45 minutes of the initial Herceptin infusion; when she developed some mild generalized flushing.  She denied any other new symptoms whatsoever.  Herceptin was held; and confirm the patient did receive premedications of both Tylenol 650 mg, and Benadryl 25 mg.  Vital signs remained stable throughout.  After just a few brief minutes-all symptoms essentially resolved; and patient was able to complete her chemotherapy as previously directed.    HPI  ROS  Past Medical History  Diagnosis Date  . Anxiety   . Elevated cholesterol   . Depression   . Chest pain     a. 09/2008 Cath: ER 60%, LM nl, LAD 63m, 52m/d, LCX nondom, nl, RCA dom, 55m, PDA/PLA nl.  . Tobacco abuse   . H/O degenerative disc disease   . Myocardial infarction     "I think I had a heart attack in 2010"  . Headache     "frequency depends on the weather" (06/04/2014)  . Arthritis     "neck" (06/04/2014)  . Chronic lower back pain   . Gastroesophageal reflux disease     H/O  . Hot flashes     Past Surgical History  Procedure Laterality Date  . Ovarian cyst removal  1980's?  . Tonsillectomy    . Cardiac catheterization  10/15/2008    noncritical mid & distal third anterior descending disease  . Portacath placement Left 09/17/2014    Procedure: INSERTION PORT-A-CATH;  Surgeon: Almond Lint, MD;  Location: [facility];  Service: General;  Laterality: Left;    has Other and unspecified hyperlipidemia; Depression; Lumbar pain; Chest pain; Tobacco abuse; Abnormality of gait; Breast cancer of upper-inner quadrant of right female  breast; Breast cancer; Family history of malignant neoplasm of ovary; Family history of malignant neoplasm of gastrointestinal tract; and Hypersensitivity reaction on her problem list.    is allergic to penicillins; statins; and sulfa antibiotics.    Medication List       This list is accurate as of: 09/23/14  1:33 PM.  Always use your most recent med list.               ALPRAZolam 0.5 MG tablet  Commonly known as:  XANAX  TAKE 1 TABLET BY MOUTH EVERY 6 HOURS AS NEEDED FOR MUSCLE SPASMS     aspirin EC 81 MG tablet  Take 81 mg by mouth daily.     b complex vitamins capsule  Take 1 capsule by mouth at bedtime.     CALCIUM + D PO  Take 1 tablet by mouth at bedtime.     clonazePAM 1 MG tablet  Commonly known as:  KLONOPIN  TAKE 1 TABLET BY MOUTH AT BEDTIME AS NEEDED FOR ANXIETY     dexamethasone 4 MG tablet  Commonly known as:  DECADRON  Take 2 tablets (8 mg total) by mouth 2 (two) times daily. Start the day before Taxotere. Then again the day after chemo for 3 days.     ezetimibe 10 MG tablet  Commonly known as:  ZETIA  Take 1 tablet (10 mg total) by mouth daily.     fish oil-omega-3 fatty acids 1000 MG capsule  Take 1 g by mouth  at bedtime.     fluticasone 50 MCG/ACT nasal spray  Commonly known as:  FLONASE  Place 2 sprays into both nostrils daily.     levocetirizine 5 MG tablet  Commonly known as:  XYZAL  TAKE 1 TABLET (5 MG TOTAL) BY MOUTH EVERY EVENING.     lidocaine-prilocaine cream  Commonly known as:  EMLA  Apply to affected area once     LORazepam 0.5 MG tablet  Commonly known as:  ATIVAN  Take 1 tablet (0.5 mg total) by mouth every 6 (six) hours as needed (Nausea or vomiting).     Lysine 500 MG Caps  Take 500 mg by mouth at bedtime.     multivitamin with minerals tablet  Take 1 tablet by mouth at bedtime.     ondansetron 8 MG tablet  Commonly known as:  ZOFRAN  Take 1 tablet (8 mg total) by mouth 2 (two) times daily. Start the day after chemo for  3 days. Then take as needed for nausea or vomiting.     oxyCODONE-acetaminophen 5-325 MG per tablet  Commonly known as:  ROXICET  Take 1-2 tablets by mouth every 4 (four) hours as needed for severe pain.     prochlorperazine 10 MG tablet  Commonly known as:  COMPAZINE  Take 1 tablet (10 mg total) by mouth every 6 (six) hours as needed (Nausea or vomiting).     ranitidine 150 MG tablet  Commonly known as:  ZANTAC  TAKE 1 TABLET (150 MG TOTAL) BY MOUTH 2 (TWO) TIMES DAILY.     Salicylic Acid 3 % Sham  Apply 1 application topically daily.     sertraline 50 MG tablet  Commonly known as:  ZOLOFT  Take 1 tablet (50 mg total) by mouth daily.         PHYSICAL EXAMINATION  Oncology Vitals 09/23/2014 09/17/2014 09/17/2014 09/17/2014 09/17/2014 09/17/2014 09/17/2014  Height - - - - - - -  Weight - - - - - - -  Weight (lbs) - - - - - - -  BMI (kg/m2) - - - - - - -  Temp 98.6 98.1 - - - - -  Pulse 86 74 - - 74 72 74  Resp 18 16 - - $R'21 25 12  'Am$ SpO2 98 95 95 95 96 95 100  BSA (m2) - - - - - - -   BP Readings from Last 3 Encounters:  09/23/14 117/70  09/17/14 105/67  09/16/14 120/71    Physical Exam  Constitutional: She is oriented to person, place, and time and well-developed, well-nourished, and in no distress.  HENT:  Head: Normocephalic and atraumatic.  Eyes: Conjunctivae and EOM are normal. Pupils are equal, round, and reactive to light. Right eye exhibits no discharge. Left eye exhibits no discharge. No scleral icterus.  Neck: Normal range of motion.  Pulmonary/Chest: Effort normal. No stridor. No respiratory distress.  Musculoskeletal: Normal range of motion.  Neurological: She is alert and oriented to person, place, and time.  Skin: Skin is warm and dry. No rash noted. No erythema. No pallor.  Psychiatric: Affect normal.  Nursing note and vitals reviewed.   LABORATORY DATA:. Appointment on 09/23/2014  Component Date Value Ref Range Status  . WBC 09/23/2014 8.8  3.9 - 10.3  10e3/uL Final  . NEUT# 09/23/2014 7.7* 1.5 - 6.5 10e3/uL Final  . HGB 09/23/2014 13.8  11.6 - 15.9 g/dL Final  . HCT 09/23/2014 39.8  34.8 - 46.6 % Final  . Platelets  09/23/2014 208  145 - 400 10e3/uL Final  . MCV 09/23/2014 95.2  79.5 - 101.0 fL Final  . MCH 09/23/2014 33.0  25.1 - 34.0 pg Final  . MCHC 09/23/2014 34.7  31.5 - 36.0 g/dL Final  . RBC 09/23/2014 4.18  3.70 - 5.45 10e6/uL Final  . RDW 09/23/2014 12.5  11.2 - 14.5 % Final  . lymph# 09/23/2014 0.5* 0.9 - 3.3 10e3/uL Final  . MONO# 09/23/2014 0.5  0.1 - 0.9 10e3/uL Final  . Eosinophils Absolute 09/23/2014 0.0  0.0 - 0.5 10e3/uL Final  . Basophils Absolute 09/23/2014 0.0  0.0 - 0.1 10e3/uL Final  . NEUT% 09/23/2014 88.0* 38.4 - 76.8 % Final  . LYMPH% 09/23/2014 5.9* 14.0 - 49.7 % Final  . MONO% 09/23/2014 6.1  0.0 - 14.0 % Final  . EOS% 09/23/2014 0.0  0.0 - 7.0 % Final  . BASO% 09/23/2014 0.0  0.0 - 2.0 % Final  . Sodium 09/23/2014 139  136 - 145 mEq/L Final  . Potassium 09/23/2014 4.1  3.5 - 5.1 mEq/L Final  . Chloride 09/23/2014 104  98 - 109 mEq/L Final  . CO2 09/23/2014 21* 22 - 29 mEq/L Final  . Glucose 09/23/2014 164* 70 - 140 mg/dl Final  . BUN 09/23/2014 7.5  7.0 - 26.0 mg/dL Final  . Creatinine 09/23/2014 0.8  0.6 - 1.1 mg/dL Final  . Total Bilirubin 09/23/2014 <0.20  0.20 - 1.20 mg/dL Final  . Alkaline Phosphatase 09/23/2014 69  40 - 150 U/L Final  . AST 09/23/2014 17  5 - 34 U/L Final  . ALT 09/23/2014 13  0 - 55 U/L Final  . Total Protein 09/23/2014 7.1  6.4 - 8.3 g/dL Final  . Albumin 09/23/2014 3.8  3.5 - 5.0 g/dL Final  . Calcium 09/23/2014 9.2  8.4 - 10.4 mg/dL Final  . Anion Gap 09/23/2014 13* 3 - 11 mEq/L Final  . EGFR 09/23/2014 82* >90 ml/min/1.73 m2 Final   eGFR is calculated using the CKD-EPI Creatinine Equation (2009)     RADIOGRAPHIC STUDIES: No results found.  ASSESSMENT/PLAN:    Breast cancer of upper-inner quadrant of right female breast Patient presents to the [facility] today to  receive her first cycle of Taxotere/carboplatinum/Herceptin/Perjeta chemotherapy regimen.  Patient had received approximately 45 minutes of the initial Herceptin infusion; when she developed some mild generalized flushing.  She denied any other new symptoms whatsoever.  Herceptin was held; and confirm the patient did receive premedications of both Tylenol 650 mg, and Benadryl 25 mg.  Vital signs remained stable throughout.  After just a few brief minutes-all symptoms essentially resolved; and patient was able to complete her chemotherapy as previously directed.  Patient has plans to return on 10/01/2014 for a follow-up visit.  She will return on 10/11/2014 for labs and a follow-up visit.  She will return on 10/14/2014 for her next chemotherapy.   Hypersensitivity reaction Patient presents to the [place] today to receive her first cycle of Taxotere/carboplatinum/Herceptin/Perjeta chemotherapy regimen.  Patient had received approximately 45 minutes of the initial Herceptin infusion; when she developed some mild generalized flushing.  She denied any other new symptoms whatsoever.  Herceptin was held; and confirm the patient did receive premedications of both Tylenol 650 mg, and Benadryl 25 mg.  Vital signs remained stable throughout.  After just a few brief minutes-all symptoms essentially resolved; and patient was able to complete her chemotherapy as previously directed.     Patient stated understanding of all instructions; and  was in agreement with this plan of care. The patient knows to call the clinic with any problems, questions or concerns.   Review/collaboration with Dr. Jana Hakim regarding all aspects of patient's visit today.   Total time spent with patient was 25 minutes;  with greater than 75 percent of that time spent in face to face counseling regarding patient's symptoms,  and coordination of care and follow up.  Disclaimer: This note was dictated with voice recognition software.  Similar sounding words can inadvertently be transcribed and may not be corrected upon review.   Drue Second, NP 09/23/2014

## 2014-09-23 NOTE — Patient Instructions (Addendum)
[facility] Discharge Instructions for Patients Receiving Chemotherapy  Today you received the following chemotherapy agents Herceptin/Perjeta/Docetaxel/Carboplatin.  To help prevent nausea and vomiting after your treatment, we encourage you to take your nausea medication as directed.    If you develop nausea and vomiting that is not controlled by your nausea medication, call the clinic.   BELOW ARE SYMPTOMS THAT SHOULD BE REPORTED IMMEDIATELY:  *FEVER GREATER THAN 100.5 F  *CHILLS WITH OR WITHOUT FEVER  NAUSEA AND VOMITING THAT IS NOT CONTROLLED WITH YOUR NAUSEA MEDICATION  *UNUSUAL SHORTNESS OF BREATH  *UNUSUAL BRUISING OR BLEEDING  TENDERNESS IN MOUTH AND THROAT WITH OR WITHOUT PRESENCE OF ULCERS  *URINARY PROBLEMS  *BOWEL PROBLEMS  UNUSUAL RASH Items with * indicate a potential emergency and should be followed up as soon as possible.  Feel free to call the clinic you have any questions or concerns. The clinic phone number is [phone].  Please show the [facility] at check-in to the Emergency Department and triage nurse.  Trastuzumab injection for infusion What is this medicine? TRASTUZUMAB (tras TOO zoo mab) is a monoclonal antibody. It targets a protein called HER2. This protein is found in some stomach and breast cancers. This medicine can stop cancer cell growth. This medicine may be used with other cancer treatments. This medicine may be used for other purposes; ask your health care provider or pharmacist if you have questions. COMMON BRAND NAME(S): Herceptin What should I tell my health care provider before I take this medicine? They need to know if you have any of these conditions: -heart disease -heart failure -infection (especially a virus infection such as chickenpox, cold sores, or herpes) -lung or breathing disease, like asthma -recent or ongoing radiation therapy -an unusual or allergic reaction to trastuzumab, benzyl alcohol, or  other medications, foods, dyes, or preservatives -pregnant or trying to get pregnant -breast-feeding How should I use this medicine? This drug is given as an infusion into a vein. It is administered in a hospital or clinic by a specially trained health care professional. Talk to your pediatrician regarding the use of this medicine in children. This medicine is not approved for use in children. Overdosage: If you think you have taken too much of this medicine contact a poison control center or emergency room at once. NOTE: This medicine is only for you. Do not share this medicine with others. What if I miss a dose? It is important not to miss a dose. Call your doctor or health care professional if you are unable to keep an appointment. What may interact with this medicine? -cyclophosphamide -doxorubicin -warfarin This list may not describe all possible interactions. Give your health care provider a list of all the medicines, herbs, non-prescription drugs, or dietary supplements you use. Also tell them if you smoke, drink alcohol, or use illegal drugs. Some items may interact with your medicine. What should I watch for while using this medicine? Visit your doctor for checks on your progress. Report any side effects. Continue your course of treatment even though you feel ill unless your doctor tells you to stop. Call your doctor or health care professional for advice if you get a fever, chills or sore throat, or other symptoms of a cold or flu. Do not treat yourself. Try to avoid being around people who are sick. You may experience fever, chills and shaking during your first infusion. These effects are usually mild and can be treated with other medicines. Report any side effects  during the infusion to your health care professional. Fever and chills usually do not happen with later infusions. What side effects may I notice from receiving this medicine? Side effects that you should report to your  doctor or other health care professional as soon as possible: -breathing difficulties -chest pain or palpitations -cough -dizziness or fainting -fever or chills, sore throat -skin rash, itching or hives -swelling of the legs or ankles -unusually weak or tired Side effects that usually do not require medical attention (report to your doctor or other health care professional if they continue or are bothersome): -loss of appetite -headache -muscle aches -nausea This list may not describe all possible side effects. Call your doctor for medical advice about side effects. You may report side effects to FDA at 1-800-FDA-1088. Where should I keep my medicine? This drug is given in a hospital or clinic and will not be stored at home. NOTE: This sheet is a summary. It may not cover all possible information. If you have questions about this medicine, talk to your doctor, pharmacist, or health care provider.  2015, Elsevier/Gold Standard. (2009-03-21 13:43:15)   Pertuzumab injection What is this medicine? PERTUZUMAB (per TOOZ ue mab) is a monoclonal antibody that targets a protein called HER2. HER2 is found in some breast cancers. This medicine can stop cancer cell growth. This medicine is used with other cancer treatments. This medicine may be used for other purposes; ask your health care provider or pharmacist if you have questions. COMMON BRAND NAME(S): PERJETA What should I tell my health care provider before I take this medicine? They need to know if you have any of these conditions: -heart disease -heart failure -high blood pressure -history of irregular heart beat -recent or ongoing radiation therapy -an unusual or allergic reaction to pertuzumab, other medicines, foods, dyes, or preservatives -pregnant or trying to get pregnant -breast-feeding How should I use this medicine? This medicine is for infusion into a vein. It is given by a health care professional in a hospital or clinic  setting. Talk to your pediatrician regarding the use of this medicine in children. Special care may be needed. Overdosage: If you think you've taken too much of this medicine contact a poison control center or emergency room at once. Overdosage: If you think you have taken too much of this medicine contact a poison control center or emergency room at once. NOTE: This medicine is only for you. Do not share this medicine with others. What if I miss a dose? It is important not to miss your dose. Call your doctor or health care professional if you are unable to keep an appointment. What may interact with this medicine? Interactions are not expected. Give your health care provider a list of all the medicines, herbs, non-prescription drugs, or dietary supplements you use. Also tell them if you smoke, drink alcohol, or use illegal drugs. Some items may interact with your medicine. This list may not describe all possible interactions. Give your health care provider a list of all the medicines, herbs, non-prescription drugs, or dietary supplements you use. Also tell them if you smoke, drink alcohol, or use illegal drugs. Some items may interact with your medicine. What should I watch for while using this medicine? Your condition will be monitored carefully while you are receiving this medicine. Report any side effects. Continue your course of treatment even though you feel ill unless your doctor tells you to stop. Do not become pregnant while taking this medicine. Women should inform  their doctor if they wish to become pregnant or think they might be pregnant. There is a potential for serious side effects to an unborn child. Talk to your health care professional or pharmacist for more information. Do not breast-feed an infant while taking this medicine. Call your doctor or health care professional for advice if you get a fever, chills or sore throat, or other symptoms of a cold or flu. Do not treat yourself.  Try to avoid being around people who are sick. You may experience fever, chills, and headache during the infusion. Report any side effects during the infusion to your health care professional. What side effects may I notice from receiving this medicine? Side effects that you should report to your doctor or health care professional as soon as possible: -breathing problems -chest pain or palpitations -dizziness -feeling faint or lightheaded -fever or chills -skin rash, itching or hives -sore throat -swelling of the face, lips, or tongue -swelling of the legs or ankles -unusually weak or tired Side effects that usually do not require medical attention (Report these to your doctor or health care professional if they continue or are bothersome.): -diarrhea -hair loss -nausea, vomiting -tiredness This list may not describe all possible side effects. Call your doctor for medical advice about side effects. You may report side effects to FDA at 1-800-FDA-1088. Where should I keep my medicine? This drug is given in a hospital or clinic and will not be stored at home. NOTE: This sheet is a summary. It may not cover all possible information. If you have questions about this medicine, talk to your doctor, pharmacist, or health care provider.  2015, Elsevier/Gold Standard. (2012-03-15 16:54:15)   Docetaxel injection What is this medicine? DOCETAXEL (doe se TAX el) is a chemotherapy drug. It targets fast dividing cells, like cancer cells, and causes these cells to die. This medicine is used to treat many types of cancers like breast cancer, certain stomach cancers, head and neck cancer, lung cancer, and prostate cancer. This medicine may be used for other purposes; ask your health care provider or pharmacist if you have questions. COMMON BRAND NAME(S): Docefrez, Taxotere What should I tell my health care provider before I take this medicine? They need to know if you have any of these  conditions: -infection (especially a virus infection such as chickenpox, cold sores, or herpes) -liver disease -low blood counts, like low white cell, platelet, or red cell counts -an unusual or allergic reaction to docetaxel, polysorbate 80, other chemotherapy agents, other medicines, foods, dyes, or preservatives -pregnant or trying to get pregnant -breast-feeding How should I use this medicine? This drug is given as an infusion into a vein. It is administered in a hospital or clinic by a specially trained health care professional. Talk to your pediatrician regarding the use of this medicine in children. Special care may be needed. Overdosage: If you think you have taken too much of this medicine contact a poison control center or emergency room at once. NOTE: This medicine is only for you. Do not share this medicine with others. What if I miss a dose? It is important not to miss your dose. Call your doctor or health care professional if you are unable to keep an appointment. What may interact with this medicine? -cyclosporine -erythromycin -ketoconazole -medicines to increase blood counts like filgrastim, pegfilgrastim, sargramostim -vaccines Talk to your doctor or health care professional before taking any of these medicines: -acetaminophen -aspirin -ibuprofen -ketoprofen -naproxen This list may not describe all  possible interactions. Give your health care provider a list of all the medicines, herbs, non-prescription drugs, or dietary supplements you use. Also tell them if you smoke, drink alcohol, or use illegal drugs. Some items may interact with your medicine. What should I watch for while using this medicine? Your condition will be monitored carefully while you are receiving this medicine. You will need important blood work done while you are taking this medicine. This drug may make you feel generally unwell. This is not uncommon, as chemotherapy can affect healthy cells as well  as cancer cells. Report any side effects. Continue your course of treatment even though you feel ill unless your doctor tells you to stop. In some cases, you may be given additional medicines to help with side effects. Follow all directions for their use. Call your doctor or health care professional for advice if you get a fever, chills or sore throat, or other symptoms of a cold or flu. Do not treat yourself. This drug decreases your body's ability to fight infections. Try to avoid being around people who are sick. This medicine may increase your risk to bruise or bleed. Call your doctor or health care professional if you notice any unusual bleeding. Be careful brushing and flossing your teeth or using a toothpick because you may get an infection or bleed more easily. If you have any dental work done, tell your dentist you are receiving this medicine. Avoid taking products that contain aspirin, acetaminophen, ibuprofen, naproxen, or ketoprofen unless instructed by your doctor. These medicines may hide a fever. This medicine contains an alcohol in the product. You may get drowsy or dizzy. Do not drive, use machinery, or do anything that needs mental alertness until you know how this medicine affects you. Do not stand or sit up quickly, especially if you are an older patient. This reduces the risk of dizzy or fainting spells. Avoid alcoholic drinks Do not become pregnant while taking this medicine. Women should inform their doctor if they wish to become pregnant or think they might be pregnant. There is a potential for serious side effects to an unborn child. Talk to your health care professional or pharmacist for more information. Do not breast-feed an infant while taking this medicine. What side effects may I notice from receiving this medicine? Side effects that you should report to your doctor or health care professional as soon as possible: -allergic reactions like skin rash, itching or hives, swelling  of the face, lips, or tongue -low blood counts - This drug may decrease the number of white blood cells, red blood cells and platelets. You may be at increased risk for infections and bleeding. -signs of infection - fever or chills, cough, sore throat, pain or difficulty passing urine -signs of decreased platelets or bleeding - bruising, pinpoint red spots on the skin, black, tarry stools, nosebleeds -signs of decreased red blood cells - unusually weak or tired, fainting spells, lightheadedness -breathing problems -fast or irregular heartbeat -low blood pressure -mouth sores -nausea and vomiting -pain, swelling, redness or irritation at the injection site -pain, tingling, numbness in the hands or feet -swelling of the ankle, feet, hands -weight gain Side effects that usually do not require medical attention (report to your prescriber or health care professional if they continue or are bothersome): -bone pain -complete hair loss including hair on your head, underarms, pubic hair, eyebrows, and eyelashes -diarrhea -excessive tearing -changes in the color of fingernails -loosening of the fingernails -nausea -muscle pain -red flush  to skin -sweating -weak or tired This list may not describe all possible side effects. Call your doctor for medical advice about side effects. You may report side effects to FDA at 1-800-FDA-1088. Where should I keep my medicine? This drug is given in a hospital or clinic and will not be stored at home. NOTE: This sheet is a summary. It may not cover all possible information. If you have questions about this medicine, talk to your doctor, pharmacist, or health care provider.  2015, Elsevier/Gold Standard. (2013-04-12 22:21:02)   Carboplatin injection What is this medicine? CARBOPLATIN (KAR boe pla tin) is a chemotherapy drug. It targets fast dividing cells, like cancer cells, and causes these cells to die. This medicine is used to treat ovarian cancer and  many other cancers. This medicine may be used for other purposes; ask your health care provider or pharmacist if you have questions. COMMON BRAND NAME(S): Paraplatin What should I tell my health care provider before I take this medicine? They need to know if you have any of these conditions: -blood disorders -hearing problems -kidney disease -recent or ongoing radiation therapy -an unusual or allergic reaction to carboplatin, cisplatin, other chemotherapy, other medicines, foods, dyes, or preservatives -pregnant or trying to get pregnant -breast-feeding How should I use this medicine? This drug is usually given as an infusion into a vein. It is administered in a hospital or clinic by a specially trained health care professional. Talk to your pediatrician regarding the use of this medicine in children. Special care may be needed. Overdosage: If you think you have taken too much of this medicine contact a poison control center or emergency room at once. NOTE: This medicine is only for you. Do not share this medicine with others. What if I miss a dose? It is important not to miss a dose. Call your doctor or health care professional if you are unable to keep an appointment. What may interact with this medicine? -medicines for seizures -medicines to increase blood counts like filgrastim, pegfilgrastim, sargramostim -some antibiotics like amikacin, gentamicin, neomycin, streptomycin, tobramycin -vaccines Talk to your doctor or health care professional before taking any of these medicines: -acetaminophen -aspirin -ibuprofen -ketoprofen -naproxen This list may not describe all possible interactions. Give your health care provider a list of all the medicines, herbs, non-prescription drugs, or dietary supplements you use. Also tell them if you smoke, drink alcohol, or use illegal drugs. Some items may interact with your medicine. What should I watch for while using this medicine? Your condition  will be monitored carefully while you are receiving this medicine. You will need important blood work done while you are taking this medicine. This drug may make you feel generally unwell. This is not uncommon, as chemotherapy can affect healthy cells as well as cancer cells. Report any side effects. Continue your course of treatment even though you feel ill unless your doctor tells you to stop. In some cases, you may be given additional medicines to help with side effects. Follow all directions for their use. Call your doctor or health care professional for advice if you get a fever, chills or sore throat, or other symptoms of a cold or flu. Do not treat yourself. This drug decreases your body's ability to fight infections. Try to avoid being around people who are sick. This medicine may increase your risk to bruise or bleed. Call your doctor or health care professional if you notice any unusual bleeding. Be careful brushing and flossing your teeth or using  a toothpick because you may get an infection or bleed more easily. If you have any dental work done, tell your dentist you are receiving this medicine. Avoid taking products that contain aspirin, acetaminophen, ibuprofen, naproxen, or ketoprofen unless instructed by your doctor. These medicines may hide a fever. Do not become pregnant while taking this medicine. Women should inform their doctor if they wish to become pregnant or think they might be pregnant. There is a potential for serious side effects to an unborn child. Talk to your health care professional or pharmacist for more information. Do not breast-feed an infant while taking this medicine. What side effects may I notice from receiving this medicine? Side effects that you should report to your doctor or health care professional as soon as possible: -allergic reactions like skin rash, itching or hives, swelling of the face, lips, or tongue -signs of infection - fever or chills, cough, sore  throat, pain or difficulty passing urine -signs of decreased platelets or bleeding - bruising, pinpoint red spots on the skin, black, tarry stools, nosebleeds -signs of decreased red blood cells - unusually weak or tired, fainting spells, lightheadedness -breathing problems -changes in hearing -changes in vision -chest pain -high blood pressure -low blood counts - This drug may decrease the number of white blood cells, red blood cells and platelets. You may be at increased risk for infections and bleeding. -nausea and vomiting -pain, swelling, redness or irritation at the injection site -pain, tingling, numbness in the hands or feet -problems with balance, talking, walking -trouble passing urine or change in the amount of urine Side effects that usually do not require medical attention (report to your doctor or health care professional if they continue or are bothersome): -hair loss -loss of appetite -metallic taste in the mouth or changes in taste This list may not describe all possible side effects. Call your doctor for medical advice about side effects. You may report side effects to FDA at 1-800-FDA-1088. Where should I keep my medicine? This drug is given in a hospital or clinic and will not be stored at home. NOTE: This sheet is a summary. It may not cover all possible information. If you have questions about this medicine, talk to your doctor, pharmacist, or health care provider.  2015, Elsevier/Gold Standard. (2007-08-22 14:38:05)

## 2014-09-23 NOTE — Assessment & Plan Note (Signed)
Patient presents to the [facility] today to receive her first cycle of Taxotere/carboplatinum/Herceptin/Perjeta chemotherapy regimen.  Patient had received approximately 45 minutes of the initial Herceptin infusion; when she developed some mild generalized flushing.  She denied any other new symptoms whatsoever.  Herceptin was held; and confirm the patient did receive premedications of both Tylenol 650 mg, and Benadryl 25 mg.  Vital signs remained stable throughout.  After just a few brief minutes-all symptoms essentially resolved; and patient was able to complete her chemotherapy as previously directed.  Patient has plans to return on 10/01/2014 for a follow-up visit.  She will return on 10/11/2014 for labs and a follow-up visit.  She will return on 10/14/2014 for her next chemotherapy.

## 2014-09-23 NOTE — Telephone Encounter (Signed)
Pt notified that rx had been called in.

## 2014-09-23 NOTE — Assessment & Plan Note (Signed)
Patient presents to the [facility] today to receive her first cycle of Taxotere/carboplatinum/Herceptin/Perjeta chemotherapy regimen.  Patient had received approximately 45 minutes of the initial Herceptin infusion; when she developed some mild generalized flushing.  She denied any other new symptoms whatsoever.  Herceptin was held; and confirm the patient did receive premedications of both Tylenol 650 mg, and Benadryl 25 mg.  Vital signs remained stable throughout.  After just a few brief minutes-all symptoms essentially resolved; and patient was able to complete her chemotherapy as previously directed.

## 2014-09-24 ENCOUNTER — Telehealth: Payer: Self-pay | Admitting: *Deleted

## 2014-09-24 NOTE — Telephone Encounter (Signed)
Chemotherapy follow up. Patient has no complaints. Instructed patient to call the clinic if she has any issues. Patient verbalized understanding.

## 2014-09-24 NOTE — Telephone Encounter (Signed)
-----   Message from Ronnette Juniper, RN sent at 09/23/2014  4:30 PM EDT ----- Regarding: 1st treatment 1st treatment; [facility]CH/Perjeta. Dr. Jana Hakim  [phone] (M) [phone] (H)

## 2014-09-25 ENCOUNTER — Ambulatory Visit: Payer: BC Managed Care – PPO

## 2014-09-25 ENCOUNTER — Ambulatory Visit: Payer: BC Managed Care – PPO | Admitting: Neurology

## 2014-09-27 ENCOUNTER — Ambulatory Visit: Payer: BC Managed Care – PPO | Admitting: Cardiovascular Disease

## 2014-09-30 ENCOUNTER — Other Ambulatory Visit: Payer: Self-pay | Admitting: Oncology

## 2014-09-30 ENCOUNTER — Other Ambulatory Visit: Payer: Self-pay

## 2014-09-30 ENCOUNTER — Telehealth: Payer: Self-pay

## 2014-09-30 DIAGNOSIS — C50211 Malignant neoplasm of upper-inner quadrant of right female breast: Secondary | ICD-10-CM

## 2014-09-30 MED ORDER — ACYCLOVIR 400 MG PO TABS: 400.0000 mg | ORAL_TABLET | Freq: Two times a day (BID) | ORAL | Status: AC

## 2014-09-30 MED ORDER — FLUCONAZOLE 100 MG PO TABS: 100.0000 mg | ORAL_TABLET | Freq: Every day | ORAL | Status: DC

## 2014-09-30 NOTE — Progress Notes (Unsigned)
LMOVM - Rx sent in to [facility].  Pt to call clinic - ask to speak with desk nurse.

## 2014-09-30 NOTE — Telephone Encounter (Signed)
Call report received from [facility] 09/29/14 3pm - pt not eating, drinking, and has diarrhea.  LMOVM for patient to follow up.

## 2014-10-01 ENCOUNTER — Other Ambulatory Visit: Payer: BC Managed Care – PPO

## 2014-10-01 ENCOUNTER — Encounter: Payer: Self-pay | Admitting: Nurse Practitioner

## 2014-10-01 ENCOUNTER — Ambulatory Visit: Payer: BC Managed Care – PPO | Admitting: Nurse Practitioner

## 2014-10-01 ENCOUNTER — Ambulatory Visit (HOSPITAL_BASED_OUTPATIENT_CLINIC_OR_DEPARTMENT_OTHER): Payer: BC Managed Care – PPO | Admitting: Nurse Practitioner

## 2014-10-01 VITALS — BP 118/74 | HR 87 | Temp 97.7°F | Resp 18 | Ht 62.0 in | Wt 123.2 lb

## 2014-10-01 DIAGNOSIS — E860 Dehydration: Secondary | ICD-10-CM

## 2014-10-01 DIAGNOSIS — T451X5A Adverse effect of antineoplastic and immunosuppressive drugs, initial encounter: Secondary | ICD-10-CM | POA: Insufficient documentation

## 2014-10-01 DIAGNOSIS — R630 Anorexia: Secondary | ICD-10-CM

## 2014-10-01 DIAGNOSIS — C50211 Malignant neoplasm of upper-inner quadrant of right female breast: Secondary | ICD-10-CM

## 2014-10-01 DIAGNOSIS — K521 Toxic gastroenteritis and colitis: Secondary | ICD-10-CM

## 2014-10-01 DIAGNOSIS — K6289 Other specified diseases of anus and rectum: Secondary | ICD-10-CM

## 2014-10-01 DIAGNOSIS — C50811 Malignant neoplasm of overlapping sites of right female breast: Secondary | ICD-10-CM

## 2014-10-01 DIAGNOSIS — K219 Gastro-esophageal reflux disease without esophagitis: Secondary | ICD-10-CM

## 2014-10-01 DIAGNOSIS — G4700 Insomnia, unspecified: Secondary | ICD-10-CM | POA: Diagnosis not present

## 2014-10-01 DIAGNOSIS — B370 Candidal stomatitis: Secondary | ICD-10-CM | POA: Insufficient documentation

## 2014-10-01 DIAGNOSIS — K1231 Oral mucositis (ulcerative) due to antineoplastic therapy: Secondary | ICD-10-CM

## 2014-10-01 DIAGNOSIS — IMO0001 Reserved for inherently not codable concepts without codable children: Secondary | ICD-10-CM

## 2014-10-01 MED ORDER — CHOLESTYRAMINE 4 G PO PACK: 4.0000 g | PACK | Freq: Two times a day (BID) | ORAL | Status: DC

## 2014-10-01 MED ORDER — OMEPRAZOLE 40 MG PO CPDR: 40.0000 mg | DELAYED_RELEASE_CAPSULE | Freq: Every day | ORAL | Status: DC

## 2014-10-01 NOTE — Progress Notes (Signed)
[place]  [phone] [phone]     ID: Beth Rice DOB: 1955-08-16  MR#: [id number]  IWL#:[id number]  Patient Care Team: Beth Russian, MD as PCP - General (Family Medicine) Beth Klein, MD as Consulting Physician (General Surgery) Beth Cruel, MD as Consulting Physician (Oncology) Beth Gibson, MD as Attending Physician (Radiation Oncology) Beth Germany, RN as Registered Nurse Beth Kaufmann, RN as Registered Nurse PCP: Beth Reichmann, MD OTHER MD: Beth Kato MD  CHIEF COMPLAINT: HER-2 positive breast cancer  CURRENT TREATMENT: Neoadjuvant chemotherapy and anti-HER-2 immunotherapy   BREAST CANCER HISTORY: Beth Rice herself palpated a mass in her right breast but "didn't think much about it". She saw Dr. Phineas Rice for routine gynecologic follow-up and he also palpated and immediately set her up for right diagnostic mammography with tomosynthesis and ultrasonography at the [facility] 08/27/2014. In the upper outer quadrant of the right breast there was a spiculated mass accompanied by calcifications, the complex extending up to 2.5 cm. This was palpable and mobile. It measured approximately 2 cm by palpation. There was no palpable right axillary adenopathy. Ultrasound of the right axilla also showed normal axillary contents.  Ultrasound of the right breast confirmed an irregularly marginated hypoechoic mass measuring 1.8 cm. Biopsy was not immediately performed because of patient was on aspirin at the time. On 08/30/2014 Beth Rice underwent biopsy of the mass in question, with the pathology ([id number]) (showing an invasive ductal carcinoma, grade 2, estrogen receptor 2% positive with moderate staining intensity, progesterone receptor negative, with an MIB-1 of 81% and with HER-2 amplified, the signals ratio being 2.87 and the number per cell 4.30.  On 09/03/2024 she underwent bilateral breast MRI. This found the breast composition to be  category C. In the right breast there was a spiculated mass measuring 2.3 cm in close proximity to the pectoralis but without obvious invasion of the muscle. The rest of the breast, the left breast, and the lymph node areas well otherwise unremarkable.  Her subsequent history is as detailed below   INTERVAL HISTORY: Beth Rice returns for follow up of her breast cancer, accompanied by her sister Beth Rice. Today is day 8, cycle 1 of carboplatin, docetaxel, trastuzumab and pertuzumab given every 3 weeks.  REVIEW OF SYSTEMS: Beth Rice was "ready to give up" chemotherapy based off of her experience for the past week. Things got progressively worse starting with day 4. She completely lost her appetite, began having serious diarrhea, and was not able to keep up with her fluids which compounded her issues. She used imodium but her diarrhea persists even today, though she's down from 6 to 3 movements daily. She had burning from reflux down her entire throat and into her stomach. Her diarrhea was acidic as well and her rectum is now irritated. She had bone pain from the neulasta that claratin, tylenol, and even oxycodone could not solve. She complains of nose and mouth sore along with thrush. She was prescribed fluconazole and valacyclovir. The thrush is still present but is starting to clear up. She has always had difficulty sleeping, but it was worse this week, likely because of the steroids. She is on klonopin with "did nothing". She is anxious and depressed. A detailed review of systems is otherwise stable.  PAST MEDICAL HISTORY: Past Medical History  Diagnosis Date  . Anxiety   . Elevated cholesterol   . Depression   . Chest pain     a. 09/2008 Cath: ER 60%, LM nl,  LAD 67m 461m, LCX nondom, nl, RCA dom, 2045mDA/PLA nl.  . Tobacco abuse   . H/O degenerative disc disease   . Myocardial infarction     "I think I had a heart attack in 2010"  . Headache     "frequency depends on the weather" (06/04/2014)  .  Arthritis     "neck" (06/04/2014)  . Chronic lower back pain   . Gastroesophageal reflux disease     H/O  . Hot flashes     PAST SURGICAL HISTORY: Past Surgical History  Procedure Laterality Date  . Ovarian cyst removal  1980's?  . Tonsillectomy    . Cardiac catheterization  10/15/2008    noncritical mid & distal third anterior descending disease  . Portacath placement Left 09/17/2014    Procedure: INSERTION PORT-A-CATH;  Surgeon: Beth Rice;  [facility];  Laterality: Left;    FAMILY HISTORY Family History  Problem Relation Age of Onset  . Hypertension Sister   . Heart disease Mother   . Alzheimer's disease Mother   . Cerebral aneurysm Father   . Mental illness Father   . Cancer Maternal Aunt 45    ovarian  . Cancer Other 75 105 mat great [facility]  . Cancer Other 65 84 mat [facility]  . Cancer Cousin 65 74 paternal female first cousin through an uncle with pancreatic cancer  The patient's father died at the age of 49 44ter having cerebral hemorrhages and age 38 29d 32.66he patient's mother died at the age of 77 60th Parkinson's and Alzheimer's disease. The patient's mother had one sister and this sister was diagnosed with ovarian cancer in her early 40s64shere is no other history of breast or ovarian cancer in the family.   GYNECOLOGIC HISTORY:  No LMP recorded. Patient is postmenopausal.  menarche age [age]he patient is GX P0. She stopped having periods in 2005. She did not take hormone replacement.   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 She is single, lives alone with 4 dogs.    ADVANCED DIRECTIVES: Not in place. She tells me she plans to name her sister JoaLollie Marrow her healthcare power of attorney. [name]  is a nurMarine scientistan be reached in [place] [phone]) or [phone]).   HEALTH MAINTENANCE: History  Substance Use Topics  . Smoking status: Former Smoker -- 1.00 packs/day for 20 years    Types: Cigarettes    Quit date: 06/21/2014  . Smokeless tobacco: Never Used  . Alcohol Use: 1.8 oz/week    3 Cans of beer, 0 Standard drinks or equivalent per week     Colonoscopy:2006/[facility]  PAPXQJ:[id number]   [facility]   Lipid panel:  Allergies  Allergen Reactions  . Penicillins Hives  . Statins     Patient has significant muscle cramps with statins. She cannot tolerate them.  . Sulfa Antibiotics Hives    Current Outpatient Prescriptions  Medication Sig Dispense Refill  . acyclovir (ZOVIRAX) 400 MG tablet Take 1 tablet (400 mg total) by mouth 2 (two) times daily. 60 tablet 3  . b complex vitamins capsule Take 1 capsule by mouth at bedtime.     . Calcium Carbonate-Vitamin D (  CALCIUM + D PO) Take 1 tablet by mouth at bedtime.     . clonazePAM (KLONOPIN) 1 MG tablet TAKE 1 TABLET BY MOUTH AT BEDTIME AS NEEDED FOR ANXIETY 90 tablet 0  . ezetimibe (ZETIA) 10 MG tablet Take 1 tablet (10 mg total) by mouth daily. 30 tablet 11  . fish oil-omega-3 fatty acids 1000 MG capsule Take 1 g by mouth at bedtime.     . fluconazole (DIFLUCAN) 100 MG tablet Take 1 tablet (100 mg total) by mouth daily. 10 tablet 3  . levocetirizine (XYZAL) 5 MG tablet TAKE 1 TABLET (5 MG TOTAL) BY MOUTH EVERY EVENING. 30 tablet 3  . Lysine 500 MG CAPS Take 500 mg by mouth at bedtime.     . Multiple Vitamins-Minerals (MULTIVITAMIN WITH MINERALS) tablet Take 1 tablet by mouth at bedtime.     Marland Kitchen oxyCODONE-acetaminophen (ROXICET) 5-325 MG per tablet Take 1-2 tablets by mouth every 4 (four) hours as needed for severe pain. 30 tablet 0  . prochlorperazine (COMPAZINE) 10 MG tablet Take 1 tablet (10 mg total) by mouth every 6 (six) hours as needed (Nausea or  vomiting). 30 tablet 1  . ranitidine (ZANTAC) 150 MG tablet TAKE 1 TABLET (150 MG TOTAL) BY MOUTH 2 (TWO) TIMES DAILY. 60 tablet 3  . sertraline (ZOLOFT) 50 MG tablet Take 1 tablet (50 mg total) by mouth daily. 30 tablet 11  . ALPRAZolam (XANAX) 0.5 MG tablet TAKE 1 TABLET BY MOUTH EVERY 6 HOURS AS NEEDED FOR MUSCLE SPASMS (Patient not taking: Reported on 10/01/2014) 30 tablet 2  . aspirin EC 81 MG tablet Take 81 mg by mouth daily.    . cholestyramine (QUESTRAN) 4 G packet Take 1 packet (4 g total) by mouth 2 (two) times daily. 60 each 1  . dexamethasone (DECADRON) 4 MG tablet Take 2 tablets (8 mg total) by mouth 2 (two) times daily. Start the day before Taxotere. Then again the day after chemo for 3 days. (Patient not taking: Reported on 10/01/2014) 30 tablet 1  . fluticasone (FLONASE) 50 MCG/ACT nasal spray Place 2 sprays into both nostrils daily. (Patient not taking: Reported on 10/01/2014) 16 g 11  . lidocaine-prilocaine (EMLA) cream Apply to affected area once (Patient not taking: Reported on 10/01/2014) 30 g 3  . LORazepam (ATIVAN) 0.5 MG tablet Take 1 tablet (0.5 mg total) by mouth every 6 (six) hours as needed (Nausea or vomiting). (Patient not taking: Reported on 10/01/2014) 30 tablet 0  . omeprazole (PRILOSEC) 40 MG capsule Take 1 capsule (40 mg total) by mouth daily. 30 capsule 2  . ondansetron (ZOFRAN) 8 MG tablet Take 1 tablet (8 mg total) by mouth 2 (two) times daily. Start the day after chemo for 3 days. Then take as needed for nausea or vomiting. (Patient not taking: Reported on 10/01/2014) 30 tablet 1  . Salicylic Acid 3 % SHAM Apply 1 application topically daily. (Patient not taking: Reported on 09/16/2014) 177 mL 0   No current facility-administered medications for this visit.    OBJECTIVE: middle-aged white woman without appears older than stated age  59 Vitals:   10/01/14 1415  BP: 118/74  Pulse: 87  Temp: 97.7 F (36.5 C)  Resp: 18     Body mass index is 22.53 kg/(m^2).    ECOG  FS:1 - Symptomatic but completely ambulatory  Skin: warm, dry  HEENT: sclerae anicteric, conjunctivae pink, oropharynx clear. No thrush or mucositis.  Lymph Nodes: No cervical or supraclavicular lymphadenopathy  Lungs: clear  to auscultation bilaterally, no rales, wheezes, or rhonci  Heart: regular rate and rhythm  Abdomen: round, soft, non tender, positive bowel sounds  Musculoskeletal: No focal spinal tenderness, no peripheral edema  Neuro: non focal, well oriented, positive affect  Breasts: deferred   LAB RESULTS:  CMP     Component Value Date/Time   NA 139 09/23/2014 0833   NA 139 06/18/2014 1133   K 4.1 09/23/2014 0833   K 4.2 06/18/2014 1133   CL 103 06/18/2014 1133   CO2 21* 09/23/2014 0833   CO2 27 06/18/2014 1133   GLUCOSE 164* 09/23/2014 0833   GLUCOSE 93 06/18/2014 1133   BUN 7.5 09/23/2014 0833   BUN 7 06/18/2014 1133   CREATININE 0.8 09/23/2014 0833   CREATININE 0.73 06/18/2014 1133   CREATININE 0.64 06/03/2014 1203   CALCIUM 9.2 09/23/2014 0833   CALCIUM 9.3 06/18/2014 1133   PROT 7.1 09/23/2014 0833   PROT 6.8 10/23/2013 1030   ALBUMIN 3.8 09/23/2014 0833   ALBUMIN 4.5 10/23/2013 1030   AST 17 09/23/2014 0833   AST 25 10/23/2013 1030   ALT 13 09/23/2014 0833   ALT 16 10/23/2013 1030   ALKPHOS 69 09/23/2014 0833   ALKPHOS 68 10/23/2013 1030   BILITOT <0.20 09/23/2014 0833   BILITOT 0.7 10/23/2013 1030   GFRNONAA >90 06/03/2014 1203   GFRNONAA >89 10/23/2013 1030   GFRAA >90 06/03/2014 1203   GFRAA >89 10/23/2013 1030    INo results found for: SPEP, UPEP  Lab Results  Component Value Date   WBC 8.8 09/23/2014   NEUTROABS 7.7* 09/23/2014   HGB 13.8 09/23/2014   HCT 39.8 09/23/2014   MCV 95.2 09/23/2014   PLT 208 09/23/2014      Chemistry      Component Value Date/Time   NA 139 09/23/2014 0833   NA 139 06/18/2014 1133   K 4.1 09/23/2014 0833   K 4.2 06/18/2014 1133   CL 103 06/18/2014 1133   CO2 21* 09/23/2014 0833   CO2 27 06/18/2014  1133   BUN 7.5 09/23/2014 0833   BUN 7 06/18/2014 1133   CREATININE 0.8 09/23/2014 0833   CREATININE 0.73 06/18/2014 1133   CREATININE 0.64 06/03/2014 1203      Component Value Date/Time   CALCIUM 9.2 09/23/2014 0833   CALCIUM 9.3 06/18/2014 1133   ALKPHOS 69 09/23/2014 0833   ALKPHOS 68 10/23/2013 1030   AST 17 09/23/2014 0833   AST 25 10/23/2013 1030   ALT 13 09/23/2014 0833   ALT 16 10/23/2013 1030   BILITOT <0.20 09/23/2014 0833   BILITOT 0.7 10/23/2013 1030       No results found for: LABCA2  No components found for: LABCA125  No results for input(s): INR in the last 168 hours.  Urinalysis    Component Value Date/Time   COLORURINE CANCELED 08/21/2014 1204   APPEARANCEUR CANCELED 08/21/2014 1204   LABSPEC CANCELED 08/21/2014 1204   PHURINE CANCELED 08/21/2014 1204   GLUCOSEU CANCELED 08/21/2014 1204   HGBUR CANCELED 08/21/2014 1204   BILIRUBINUR CANCELED 08/21/2014 1204   BILIRUBINUR neg 06/18/2014 1103   KETONESUR CANCELED 08/21/2014 1204   PROTEINUR CANCELED 08/21/2014 1204   PROTEINUR neg 06/18/2014 1103   UROBILINOGEN CANCELED 08/21/2014 1204   UROBILINOGEN 0.2 06/18/2014 1103   NITRITE CANCELED 08/21/2014 1204   NITRITE neg 06/18/2014 1103   LEUKOCYTESUR CANCELED 08/21/2014 1204    STUDIES: Mr Breast Bilateral W Wo Contrast  09/04/2014   CLINICAL DATA:  58 year old female  with recently diagnosed invasive ductal carcinoma of the right breast following ultrasound-guided biopsy of a palpable mass at the 12:30 position, with the mass and calcifications measuring 2.5 cm mammographically.  LABS:  Not applicable.  EXAM: BILATERAL BREAST MRI WITH AND WITHOUT CONTRAST  TECHNIQUE: Multiplanar, multisequence MR images of both breasts were obtained prior to and following the intravenous administration of 11 ml of MultiHance.  THREE-DIMENSIONAL MR IMAGE RENDERING ON INDEPENDENT WORKSTATION:  Three-dimensional MR images were rendered by post-processing of the original MR  data on an independent workstation. The three-dimensional MR images were interpreted, and findings are reported in the following complete MRI report for this study. Three dimensional images were evaluated at the independent DynaCad workstation  COMPARISON:  Previous exam(s).  FINDINGS: Breast composition: c.  Heterogeneous fibroglandular tissue.  Background parenchymal enhancement: Moderate.  Right breast: Spiculated mass containing biopsy clip artifact in the superior right breast posterior depth at the approximate 12:30 position measures 2.3 cm AP, 2.3 cm transverse, and 2.1 cm craniocaudal. The mass comes into close proximity with the adjacent pectoralis muscle, however there are no findings to suggest muscular invasion. No additional suspicious areas of enhancement or masses seen in the right breast to suggest additional sites of disease.  Left breast: No suspicious rapidly enhancing masses or abnormal areas of enhancement seen in the left breast to suggest malignancy.  Lymph nodes: No morphologically abnormal axillary lymph nodes. No internal mammary lymphadenopathy.  Ancillary findings:  None.  IMPRESSION: 1. Biopsy proven malignancy in the superior posterior right breast at the approximate 12:30 position measures up to 2.3 cm, and corresponds to the mass and associated calcifications seen mammographically spanning approximately 2.5 cm. No findings to suggest additional sites of disease in the right breast.  2.  No MRI evidence of malignancy in the left breast.  RECOMMENDATION: Treatment plan for known right breast cancer.  BI-RADS CATEGORY  6: Known biopsy-proven malignancy.   Electronically Signed   By: Everlean Alstrom M.D.   On: 09/04/2014 10:46   Dg Chest Port 1 View  09/17/2014   CLINICAL DATA:  Port-A-Cath placement  EXAM: PORTABLE CHEST - 1 VIEW  COMPARISON:  Portable chest x-ray of 06/05/2014  FINDINGS: A left-sided Port-A-Cath is present with the tip overlying the expected SVC -RA junction. No  pneumothorax is seen. There is mild haziness at the lung bases which may be due to atelectasis, but pneumonia cannot be excluded. No pleural effusion is seen. Heart size is stable.  IMPRESSION: 1. Left-sided Port-A-Cath tip overlies the expected SVC -RA junction. No pneumothorax. 2. Parenchymal haziness at the lung bases right greater than left. Possible atelectasis, but pneumonia cannot be excluded.   Electronically Signed   By: Ivar Drape M.D.   On: 09/17/2014 10:22   Dg Fluoro Guide Cv Line-no Report  09/17/2014   CLINICAL DATA:    FLOURO GUIDE CV LINE  Fluoroscopy was utilized by the requesting physician.  No radiographic  interpretation.    Mm Diag Breast Tomo Uni Left  09/04/2014   CLINICAL DATA:  59 year old female with recently diagnosed invasive carcinoma of the right breast. Patient presents for pre MRI mammography of the left breast.  EXAM: DIGITAL DIAGNOSTIC LEFT MAMMOGRAM WITH 3D TOMOSYNTHESIS AND CAD  COMPARISON:  Previous exams.  ACR Breast Density Category c: The breast tissue is heterogeneously dense, which may obscure small masses.  FINDINGS: No suspicious masses or calcifications are seen in the left breast. There is no mammographic evidence of malignancy in the left breast.  Mammographic images were processed with CAD.  IMPRESSION: No mammographic evidence of malignancy in the left breast.  RECOMMENDATION: Treatment plan for known right breast malignancy.  I have discussed the findings and recommendations with the patient. Results were also provided in writing at the conclusion of the visit. If applicable, a reminder letter will be sent to the patient regarding the next appointment.  BI-RADS CATEGORY  1: Negative.   Electronically Signed   By: Everlean Alstrom M.D.   On: 09/04/2014 11:14    ASSESSMENT: 59 y.o. [place] woman status post right breast biopsy 08/30/2014 for a clinical T2 N0, stage IIA invasive ductal carcinoma, grade 2, estrogen receptor2% "positive," progesterone  receptor negative, HER-2 positive, with an MIB-1 of 81%.  (1) neoadjuvant chemotherapy will consist of carboplatin, docetaxel, trastuzumab and pertuzumab  (2) trastuzumab will be continued to complete 1 year  (3) surgery will follow chemotherapy  (4) adjuvant radiation will follow surgery  (5) consider anti-estrogens after radiation  (6) genetics testing pending  PLAN: Vala had a troubling time with her first round of chemotherapy. I am making the following adjustments to the management of her side effects:  1. Diarrhea - continue imodium up to 4 times daily, add questran powder twice daily.  2. Reflux/heartburn - stop ranitidine. Start omeprazole 37m daily.  3. Bone pain - alternate the use of naproxen and oxycodone. Use claratin for 5 days starting day of injection. 4. Nose irritation - add ocean nasal spray QID PRN 5. Mucositis/thrush - add magic mouthwash QID PRN, continue fluconazole until the resolution of thrush, warm salt water rinses 6. Insomnia - stop klonopin, start 0.532mativan QHS and melatonin 7. Anorexia - push protein. Add protein powder to milk or add fruit to create smoothie (not a fan of boost or ensure) 8. Rectal irritation - tuck's wipes as directed, or apply witch hazel to area using cotton ball 9. Dehydration - IV fluids scheduled tomorrow, and will be prescheduled with next cycle of treatment.  All of the previous interventions were appropriately prescribed and instructions were written on a sheet of paper for the patient to refer to accordingly.   ShAxelill retin in 2 weeks for labs, an office visit, and the start of cycle 2. She understands and agrees with this plan. She knows the goal of treatment in her case is cure. She has been encouraged to call with any issues that might arise before her next visit here.  HeLaurie PandaNP   10/01/2014 5:25 PM

## 2014-10-02 ENCOUNTER — Other Ambulatory Visit: Payer: Self-pay | Admitting: Nurse Practitioner

## 2014-10-02 ENCOUNTER — Ambulatory Visit (HOSPITAL_BASED_OUTPATIENT_CLINIC_OR_DEPARTMENT_OTHER): Payer: BC Managed Care – PPO

## 2014-10-02 ENCOUNTER — Other Ambulatory Visit (HOSPITAL_BASED_OUTPATIENT_CLINIC_OR_DEPARTMENT_OTHER): Payer: BC Managed Care – PPO

## 2014-10-02 ENCOUNTER — Telehealth: Payer: Self-pay | Admitting: *Deleted

## 2014-10-02 VITALS — BP 130/76 | HR 87 | Temp 98.8°F | Resp 18

## 2014-10-02 DIAGNOSIS — C50811 Malignant neoplasm of overlapping sites of right female breast: Secondary | ICD-10-CM

## 2014-10-02 DIAGNOSIS — E860 Dehydration: Secondary | ICD-10-CM

## 2014-10-02 DIAGNOSIS — C50211 Malignant neoplasm of upper-inner quadrant of right female breast: Secondary | ICD-10-CM

## 2014-10-02 LAB — CBC WITH DIFFERENTIAL/PLATELET
BASO%: 0.3 % (ref 0.0–2.0)
BASOS ABS: 0 10*3/uL (ref 0.0–0.1)
EOS%: 0.3 % (ref 0.0–7.0)
Eosinophils Absolute: 0 10*3/uL (ref 0.0–0.5)
HEMATOCRIT: 37.3 % (ref 34.8–46.6)
HGB: 13 g/dL (ref 11.6–15.9)
LYMPH%: 18 % (ref 14.0–49.7)
MCH: 33.2 pg (ref 25.1–34.0)
MCHC: 34.9 g/dL (ref 31.5–36.0)
MCV: 95.4 fL (ref 79.5–101.0)
MONO#: 1.6 10*3/uL — ABNORMAL HIGH (ref 0.1–0.9)
MONO%: 17.2 % — ABNORMAL HIGH (ref 0.0–14.0)
NEUT#: 6 10*3/uL (ref 1.5–6.5)
NEUT%: 64.2 % (ref 38.4–76.8)
PLATELETS: 175 10*3/uL (ref 145–400)
RBC: 3.91 10*6/uL (ref 3.70–5.45)
RDW: 12.4 % (ref 11.2–14.5)
WBC: 9.3 10*3/uL (ref 3.9–10.3)
lymph#: 1.7 10*3/uL (ref 0.9–3.3)
nRBC: 0 % (ref 0–0)

## 2014-10-02 MED ORDER — SODIUM CHLORIDE 0.9 % IJ SOLN
10.0000 mL | INTRAMUSCULAR | Status: DC | PRN
  Administered 2014-10-02: 10 mL
  Filled 2014-10-02: qty 10

## 2014-10-02 MED ORDER — SODIUM CHLORIDE 0.9 % IV SOLN
1000.0000 mL | Freq: Once | INTRAVENOUS | Status: AC
  Administered 2014-10-02: 15:00:00 via INTRAVENOUS

## 2014-10-02 MED ORDER — SODIUM CHLORIDE 0.9 % IV SOLN
Freq: Once | INTRAVENOUS | Status: AC
  Administered 2014-10-02: 15:00:00 via INTRAVENOUS
  Filled 2014-10-02: qty 4

## 2014-10-02 MED ORDER — HEPARIN SOD (PORK) LOCK FLUSH 100 UNIT/ML IV SOLN
500.0000 [IU] | Freq: Once | INTRAVENOUS | Status: AC | PRN
  Administered 2014-10-02: 500 [IU]
  Filled 2014-10-02: qty 5

## 2014-10-02 NOTE — Telephone Encounter (Signed)
Called and spoke with pt to inform her of the appt time for today to receive IV fluids is 2:15p. Pt verbalized understanding and agreed. Message to be forwarded to Gentry Fitz, NP.

## 2014-10-02 NOTE — Patient Instructions (Signed)
Dehydration, Adult Dehydration is when you lose more fluids from the body than you take in. Vital organs like the kidneys, brain, and heart cannot function without a proper amount of fluids and salt. Any loss of fluids from the body can cause dehydration.  CAUSES   Vomiting.  Diarrhea.  Excessive sweating.  Excessive urine output.  Fever. SYMPTOMS  Mild dehydration  Thirst.  Dry lips.  Slightly dry mouth. Moderate dehydration  Very dry mouth.  Sunken eyes.  Skin does not bounce back quickly when lightly pinched and released.  Dark urine and decreased urine production.  Decreased tear production.  Headache. Severe dehydration  Very dry mouth.  Extreme thirst.  Rapid, weak pulse (more than 100 beats per minute at rest).  Cold hands and feet.  Not able to sweat in spite of heat and temperature.  Rapid breathing.  Blue lips.  Confusion and lethargy.  Difficulty being awakened.  Minimal urine production.  No tears. DIAGNOSIS  Your caregiver will diagnose dehydration based on your symptoms and your exam. Blood and urine tests will help confirm the diagnosis. The diagnostic evaluation should also identify the cause of dehydration. TREATMENT  Treatment of mild or moderate dehydration can often be done at home by increasing the amount of fluids that you drink. It is best to drink small amounts of fluid more often. Drinking too much at one time can make vomiting worse. Refer to the home care instructions below. Severe dehydration needs to be treated at the hospital where you will probably be given intravenous (IV) fluids that contain water and electrolytes. HOME CARE INSTRUCTIONS   Ask your caregiver about specific rehydration instructions.  Drink enough fluids to keep your urine clear or pale yellow.  Drink small amounts frequently if you have nausea and vomiting.  Eat as you normally do.  Avoid:  Foods or drinks high in sugar.  Carbonated  drinks.  Juice.  Extremely hot or cold fluids.  Drinks with caffeine.  Fatty, greasy foods.  Alcohol.  Tobacco.  Overeating.  Gelatin desserts.  Wash your hands well to avoid spreading bacteria and viruses.  Only take over-the-counter or prescription medicines for pain, discomfort, or fever as directed by your caregiver.  Ask your caregiver if you should continue all prescribed and over-the-counter medicines.  Keep all follow-up appointments with your caregiver. SEEK MEDICAL CARE IF:  You have abdominal pain and it increases or stays in one area (localizes).  You have a rash, stiff neck, or severe headache.  You are irritable, sleepy, or difficult to awaken.  You are weak, dizzy, or extremely thirsty. SEEK IMMEDIATE MEDICAL CARE IF:   You are unable to keep fluids down or you get worse despite treatment.  You have frequent episodes of vomiting or diarrhea.  You have blood or green matter (bile) in your vomit.  You have blood in your stool or your stool looks black and tarry.  You have not urinated in 6 to 8 hours, or you have only urinated a small amount of very dark urine.  You have a fever.  You faint. MAKE SURE YOU:   Understand these instructions.  Will watch your condition.  Will get help right away if you are not doing well or get worse. Document Released: 05/17/2005 Document Revised: 08/09/2011 Document Reviewed: 01/04/2011 ExitCare Patient Information 2015 [employer]. This information is not intended to replace advice given to you by your health care provider. Make sure you discuss any questions you have with your health care   provider.  

## 2014-10-10 ENCOUNTER — Other Ambulatory Visit: Payer: Self-pay | Admitting: *Deleted

## 2014-10-10 DIAGNOSIS — C50211 Malignant neoplasm of upper-inner quadrant of right female breast: Secondary | ICD-10-CM

## 2014-10-11 ENCOUNTER — Telehealth: Payer: Self-pay | Admitting: Oncology

## 2014-10-11 ENCOUNTER — Ambulatory Visit (HOSPITAL_BASED_OUTPATIENT_CLINIC_OR_DEPARTMENT_OTHER): Payer: BC Managed Care – PPO | Admitting: Oncology

## 2014-10-11 ENCOUNTER — Other Ambulatory Visit (HOSPITAL_BASED_OUTPATIENT_CLINIC_OR_DEPARTMENT_OTHER): Payer: BC Managed Care – PPO

## 2014-10-11 VITALS — BP 134/79 | HR 72 | Temp 98.1°F | Resp 18 | Ht 62.0 in | Wt 124.3 lb

## 2014-10-11 DIAGNOSIS — Z170 Estrogen receptor positive status [ER+]: Secondary | ICD-10-CM | POA: Diagnosis not present

## 2014-10-11 DIAGNOSIS — C50211 Malignant neoplasm of upper-inner quadrant of right female breast: Secondary | ICD-10-CM

## 2014-10-11 DIAGNOSIS — R197 Diarrhea, unspecified: Secondary | ICD-10-CM | POA: Diagnosis not present

## 2014-10-11 DIAGNOSIS — C50811 Malignant neoplasm of overlapping sites of right female breast: Secondary | ICD-10-CM

## 2014-10-11 DIAGNOSIS — K521 Toxic gastroenteritis and colitis: Secondary | ICD-10-CM

## 2014-10-11 DIAGNOSIS — T451X5A Adverse effect of antineoplastic and immunosuppressive drugs, initial encounter: Secondary | ICD-10-CM

## 2014-10-11 LAB — COMPREHENSIVE METABOLIC PANEL (CC13)
ALT: 24 U/L (ref 0–55)
AST: 24 U/L (ref 5–34)
Albumin: 3.5 g/dL (ref 3.5–5.0)
Alkaline Phosphatase: 85 U/L (ref 40–150)
Anion Gap: 12 mEq/L — ABNORMAL HIGH (ref 3–11)
BUN: 9.2 mg/dL (ref 7.0–26.0)
CALCIUM: 8.8 mg/dL (ref 8.4–10.4)
CHLORIDE: 99 meq/L (ref 98–109)
CO2: 30 mEq/L — ABNORMAL HIGH (ref 22–29)
Creatinine: 0.8 mg/dL (ref 0.6–1.1)
EGFR: 78 mL/min/{1.73_m2} — ABNORMAL LOW (ref >90)
Glucose: 94 mg/dl (ref 70–140)
POTASSIUM: 3.4 meq/L — AB (ref 3.5–5.1)
Sodium: 141 mEq/L (ref 136–145)
TOTAL PROTEIN: 6.6 g/dL (ref 6.4–8.3)
Total Bilirubin: 0.3 mg/dL (ref 0.20–1.20)

## 2014-10-11 LAB — CBC WITH DIFFERENTIAL/PLATELET
BASO%: 1 % (ref 0.0–2.0)
Basophils Absolute: 0.1 10*3/uL (ref 0.0–0.1)
EOS%: 2.4 % (ref 0.0–7.0)
Eosinophils Absolute: 0.2 10*3/uL (ref 0.0–0.5)
HCT: 34.7 % — ABNORMAL LOW (ref 34.8–46.6)
HGB: 11.8 g/dL (ref 11.6–15.9)
LYMPH#: 2.1 10*3/uL (ref 0.9–3.3)
LYMPH%: 24.8 % (ref 14.0–49.7)
MCH: 32.5 pg (ref 25.1–34.0)
MCHC: 34.1 g/dL (ref 31.5–36.0)
MCV: 95.3 fL (ref 79.5–101.0)
MONO#: 0.9 10*3/uL (ref 0.1–0.9)
MONO%: 10.8 % (ref 0.0–14.0)
NEUT#: 5.2 10*3/uL (ref 1.5–6.5)
NEUT%: 61 % (ref 38.4–76.8)
Platelets: 309 10*3/uL (ref 145–400)
RBC: 3.64 10*6/uL — AB (ref 3.70–5.45)
RDW: 12.5 % (ref 11.2–14.5)
WBC: 8.5 10*3/uL (ref 3.9–10.3)

## 2014-10-11 MED ORDER — FLUCONAZOLE 100 MG PO TABS: 100.0000 mg | ORAL_TABLET | Freq: Every day | ORAL | Status: DC

## 2014-10-11 NOTE — Telephone Encounter (Signed)
Appointments made and avs printed for patient °

## 2014-10-11 NOTE — Progress Notes (Signed)
[name]  [phone] [phone]     ID: Beth Rice DOB: [birth year]  MR#: [id number]  DHW#:[id number]  Patient Care Team: Darlyne Russian, MD as PCP - General (Family Medicine) [name], MD as Consulting Physician (General Surgery) Chauncey Cruel, MD as Consulting Physician (Oncology) Eppie Gibson, MD as Attending Physician (Radiation Oncology) Rockwell Germany, RN as Registered Nurse Mauro Kaufmann, RN as Registered Nurse PCP: [name], MD OTHER MD: Jamie Kato MD  CHIEF COMPLAINT: HER-2 positive breast cancer  CURRENT TREATMENT: Neoadjuvant chemotherapy and anti-HER-2 immunotherapy   BREAST CANCER HISTORY: From the original intake note:  [name] herself palpated a mass in her right breast but "didn't think much about it". She saw [name] for routine gynecologic follow-up and he also palpated and immediately set her up for right diagnostic mammography with tomosynthesis and ultrasonography at the [facility] 08/27/2014. In the upper outer quadrant of the right breast there was a spiculated mass accompanied by calcifications, the complex extending up to 2.5 cm. This was palpable and mobile. It measured approximately 2 cm by palpation. There was no palpable right axillary adenopathy. Ultrasound of the right axilla also showed normal axillary contents.  Ultrasound of the right breast confirmed an irregularly marginated hypoechoic mass measuring 1.8 cm. Biopsy was not immediately performed because of patient was on aspirin at the time. On 08/30/2014 [name] underwent biopsy of the mass in question, with the pathology ([id number]) (showing an invasive ductal carcinoma, grade 2, estrogen receptor 2% positive with moderate staining intensity, progesterone receptor negative, with an MIB-1 of 81% and with HER-2 amplified, the signals ratio being 2.87 and the number per cell 4.30.  On 09/03/2024 she underwent bilateral breast MRI. This  found the breast composition to be category C. In the right breast there was a spiculated mass measuring 2.3 cm in close proximity to the pectoralis but without obvious invasion of the muscle. The rest of the breast, the left breast, and the lymph node areas well otherwise unremarkable.  Her subsequent history is as detailed below   INTERVAL HISTORY: [name] returns for follow up of her breast cancer, accompanied by her sister Beth Rice. Today is day 19, cycle 1 of 6 planned cycles of of carboplatin, docetaxel, trastuzumab and pertuzumab given every 3 weeks with Onpro support.  REVIEW OF SYSTEMS: Cora had a hard time with her first cycle. "I had every possible side effects". She has had a much better week this week, with more energy, but she continues to have altered taste, decreased appetite, insomnia, soreness in the right breast, mouth sores (improving) loose bowel movements, aches and pains here and there, and anxiety and depression symptoms. She is not exercising regularly. She has only just begun to lose her hair. A detailed review of systems today was otherwise stable  PAST MEDICAL HISTORY: Past Medical History  Diagnosis Date  . Anxiety   . Elevated cholesterol   . Depression   . Chest pain     a. 09/2008 Cath: ER 60%, LM nl, LAD 28m, 75m/d, LCX nondom, nl, RCA dom, 11m, PDA/PLA nl.  . Tobacco abuse   . H/O degenerative disc disease   . Myocardial infarction     "I think I had a heart attack in 2010"  . Headache     "frequency depends on the weather" (06/04/2014)  . Arthritis     "neck" (06/04/2014)  . Chronic lower back pain   . Gastroesophageal reflux disease  H/O  . Hot flashes     PAST SURGICAL HISTORY: Past Surgical History  Procedure Laterality Date  . Ovarian cyst removal  1980's?  . Tonsillectomy    . Cardiac catheterization  10/15/2008    noncritical mid & distal third anterior descending disease  . Portacath placement Left 09/17/2014    Procedure: INSERTION  PORT-A-CATH;  Surgeon: [name], MD;  Location: [facility];  Service: General;  Laterality: Left;    FAMILY HISTORY Family History  Problem Relation Age of Onset  . Hypertension Sister   . Heart disease Mother   . Alzheimer's disease Mother   . Cerebral aneurysm Father   . Mental illness Father   . Cancer Maternal Aunt 45    ovarian  . Cancer Other 52    mat great aunt (through [facility]) with colorectal cancer  . Cancer Other 18    mat great uncle (through [facility]) with prostate cancer  . Cancer Cousin 86    paternal female first cousin through an uncle with pancreatic cancer  The patient's father died at the age of 60 after having cerebral hemorrhages and age 60 and 27. The patient's mother died at the age of 60 with Parkinson's and Alzheimer's disease. The patient's mother had one sister and this sister was diagnosed with ovarian cancer in her early 30s. There is no other history of breast or ovarian cancer in the family.   GYNECOLOGIC HISTORY:  No LMP recorded. Patient is postmenopausal.  menarche age 17, the patient is GX P0. She stopped having periods in 2005. She did not take hormone replacement.   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 She is in process of retiring, with targeted retirement date in June. She is single, lives alone with 4 dogs.    ADVANCED DIRECTIVES: Not in place. She tells me she plans to name her sister Beth Rice as her healthcare power of attorney. Beth Rice (who is a Marine scientist) can be reached in [place] at [phone] (cell) or [phone] (home).   HEALTH MAINTENANCE: History  Substance Use Topics  . Smoking status: Former Smoker -- 1.00 packs/day for 20 years    Types: Cigarettes    Quit date: 06/21/2014  . Smokeless tobacco: Never Used  . Alcohol Use: 1.8 oz/week    3 Cans of beer, 0 Standard drinks or equivalent per week     Colonoscopy: 2006/[facility]  SFK:CLEXN  2016   B[facility] density:09/10/[facility] at [facility]/normal   Lipid panel:  Allergies  Allergen Reactions  . Penicillins Hives  . Statins     Patient has significant muscle cramps with statins. She cannot tolerate them.  . Sulfa Antibiotics Hives    Current Outpatient Prescriptions  Medication Sig Dispense Refill  . acyclovir (ZOVIRAX) 400 MG tablet Take 1 tablet (400 mg total) by mouth 2 (two) times daily. 60 tablet 3  . ALPRAZolam (XANAX) 0.5 MG tablet TAKE 1 TABLET BY MOUTH EVERY 6 HOURS AS NEEDED FOR MUSCLE SPASMS (Patient not taking: Reported on 10/01/2014) 30 tablet 2  . aspirin EC 81 MG tablet Take 81 mg by mouth daily.    Marland Kitchen b complex vitamins capsule Take 1 capsule by mouth at bedtime.     . Calcium Carbonate-Vitamin D (CALCIUM + D PO) Take 1 tablet by mouth at bedtime.     . cholestyramine (QUESTRAN) 4 G packet Take 1 packet (4 g total) by mouth 2 (two) times daily.  60 each 1  . clonazePAM (KLONOPIN) 1 MG tablet TAKE 1 TABLET BY MOUTH AT BEDTIME AS NEEDED FOR ANXIETY 90 tablet 0  . dexamethasone (DECADRON) 4 MG tablet Take 2 tablets (8 mg total) by mouth 2 (two) times daily. Start the day before Taxotere. Then again the day after chemo for 3 days. (Patient not taking: Reported on 10/01/2014) 30 tablet 1  . ezetimibe (ZETIA) 10 MG tablet Take 1 tablet (10 mg total) by mouth daily. 30 tablet 11  . fish oil-omega-3 fatty acids 1000 MG capsule Take 1 g by mouth at bedtime.     . fluconazole (DIFLUCAN) 100 MG tablet Take 1 tablet (100 mg total) by mouth daily. 10 tablet 3  . fluticasone (FLONASE) 50 MCG/ACT nasal spray Place 2 sprays into both nostrils daily. (Patient not taking: Reported on 10/01/2014) 16 g 11  . levocetirizine (XYZAL) 5 MG tablet TAKE 1 TABLET (5 MG TOTAL) BY MOUTH EVERY EVENING. 30 tablet 3  . lidocaine-prilocaine (EMLA) cream Apply to affected area once (Patient not taking: Reported on 10/01/2014) 30 g 3  . LORazepam (ATIVAN) 0.5 MG tablet Take 1 tablet (0.5  mg total) by mouth every 6 (six) hours as needed (Nausea or vomiting). (Patient not taking: Reported on 10/01/2014) 30 tablet 0  . Lysine 500 MG CAPS Take 500 mg by mouth at bedtime.     . Multiple Vitamins-Minerals (MULTIVITAMIN WITH MINERALS) tablet Take 1 tablet by mouth at bedtime.     Marland Kitchen omeprazole (PRILOSEC) 40 MG capsule Take 1 capsule (40 mg total) by mouth daily. 30 capsule 2  . ondansetron (ZOFRAN) 8 MG tablet Take 1 tablet (8 mg total) by mouth 2 (two) times daily. Start the day after chemo for 3 days. Then take as needed for nausea or vomiting. (Patient not taking: Reported on 10/01/2014) 30 tablet 1  . oxyCODONE-acetaminophen (ROXICET) 5-325 MG per tablet Take 1-2 tablets by mouth every 4 (four) hours as needed for severe pain. 30 tablet 0  . prochlorperazine (COMPAZINE) 10 MG tablet Take 1 tablet (10 mg total) by mouth every 6 (six) hours as needed (Nausea or vomiting). 30 tablet 1  . ranitidine (ZANTAC) 150 MG tablet TAKE 1 TABLET (150 MG TOTAL) BY MOUTH 2 (TWO) TIMES DAILY. 60 tablet 3  . Salicylic Acid 3 % SHAM Apply 1 application topically daily. (Patient not taking: Reported on 09/16/2014) 177 mL 0  . sertraline (ZOLOFT) 50 MG tablet Take 1 tablet (50 mg total) by mouth daily. 30 tablet 11   No current facility-administered medications for this visit.    OBJECTIVE: middle-aged white woman who appears stated age 59 Vitals:   10/11/14 1313  BP: 134/79  Pulse: 72  Temp: 98.1 F (36.7 C)  Resp: 18     Body mass index is 22.73 kg/(m^2).    ECOG FS:1 - Symptomatic but completely ambulatory  Sclerae unicteric, EOMs intact Oropharynx clear, no thrush or other lesions noted No cervical or supraclavicular adenopathy Lungs no rales or rhonchi Heart regular rate and rhythm Abd soft, nontender, positive bowel sounds MSK no focal spinal tenderness, no upper extremity lymphedema Neuro: nonfocal, well oriented, anxious affect Breasts: The mass in the right breast upper outer quadrant  is still palpable, perhaps a little softer, not obviously smaller. There is no skin or nipple change of concern. The right axilla is benign per the left breast is unremarkable.     LAB RESULTS:  CMP     Component Value Date/Time   NA  141 10/11/2014 1215   NA 139 06/18/2014 1133   K 3.4* 10/11/2014 1215   K 4.2 06/18/2014 1133   CL 103 06/18/2014 1133   CO2 30* 10/11/2014 1215   CO2 27 06/18/2014 1133   GLUCOSE 94 10/11/2014 1215   GLUCOSE 93 06/18/2014 1133   BUN 9.2 10/11/2014 1215   BUN 7 06/18/2014 1133   CREATININE 0.8 10/11/2014 1215   CREATININE 0.73 06/18/2014 1133   CREATININE 0.64 06/03/2014 1203   CALCIUM 8.8 10/11/2014 1215   CALCIUM 9.3 06/18/2014 1133   PROT 6.6 10/11/2014 1215   PROT 6.8 10/23/2013 1030   ALBUMIN 3.5 10/11/2014 1215   ALBUMIN 4.5 10/23/2013 1030   AST 24 10/11/2014 1215   AST 25 10/23/2013 1030   ALT 24 10/11/2014 1215   ALT 16 10/23/2013 1030   ALKPHOS 85 10/11/2014 1215   ALKPHOS 68 10/23/2013 1030   BILITOT 0.30 10/11/2014 1215   BILITOT 0.7 10/23/2013 1030   GFRNONAA >90 06/03/2014 1203   GFRNONAA >89 10/23/2013 1030   GFRAA >90 06/03/2014 1203   GFRAA >89 10/23/2013 1030    INo results found for: SPEP, UPEP  Lab Results  Component Value Date   WBC 8.5 10/11/2014   NEUTROABS 5.2 10/11/2014   HGB 11.8 10/11/2014   HCT 34.7* 10/11/2014   MCV 95.3 10/11/2014   PLT 309 10/11/2014      Chemistry      Component Value Date/Time   NA 141 10/11/2014 1215   NA 139 06/18/2014 1133   K 3.4* 10/11/2014 1215   K 4.2 06/18/2014 1133   CL 103 06/18/2014 1133   CO2 30* 10/11/2014 1215   CO2 27 06/18/2014 1133   BUN 9.2 10/11/2014 1215   BUN 7 06/18/2014 1133   CREATININE 0.8 10/11/2014 1215   CREATININE 0.73 06/18/2014 1133   CREATININE 0.64 06/03/2014 1203      Component Value Date/Time   CALCIUM 8.8 10/11/2014 1215   CALCIUM 9.3 06/18/2014 1133   ALKPHOS 85 10/11/2014 1215   ALKPHOS 68 10/23/2013 1030   AST 24 10/11/2014  1215   AST 25 10/23/2013 1030   ALT 24 10/11/2014 1215   ALT 16 10/23/2013 1030   BILITOT 0.30 10/11/2014 1215   BILITOT 0.7 10/23/2013 1030       No results found for: LABCA2  No components found for: LABCA125  No results for input(s): INR in the last 168 hours.  Urinalysis    Component Value Date/Time   COLORURINE CANCELED 08/21/2014 1204   APPEARANCEUR CANCELED 08/21/2014 1204   LABSPEC CANCELED 08/21/2014 1204   PHURINE CANCELED 08/21/2014 1204   GLUCOSEU CANCELED 08/21/2014 1204   HGBUR CANCELED 08/21/2014 [place] 08/21/2014 1204   BILIRUBINUR neg 06/18/2014 1103   KETONESUR CANCELED 08/21/2014 1204   PROTEINUR CANCELED 08/21/2014 1204   PROTEINUR neg 06/18/2014 1103   UROBILINOGEN CANCELED 08/21/2014 1204   UROBILINOGEN 0.2 06/18/2014 1103   NITRITE CANCELED 08/21/2014 1204   NITRITE neg 06/18/2014 1103   LEUKOCYTESUR CANCELED 08/21/2014 1204    STUDIES: Dg Chest Port 1 View  09/17/2014   CLINICAL DATA:  Port-A-Cath placement  EXAM: PORTABLE CHEST - 1 VIEW  COMPARISON:  Portable chest x-ray of 06/05/2014  FINDINGS: A left-sided Port-A-Cath is present with the tip overlying the expected SVC -RA junction. No pneumothorax is seen. There is mild haziness at the lung bases which may be due to atelectasis, but pneumonia cannot be excluded. No pleural effusion is seen. Heart  size is stable.  IMPRESSION: 1. Left-sided Port-A-Cath tip overlies the expected SVC -RA junction. No pneumothorax. 2. Parenchymal haziness at the lung bases right greater than left. Possible atelectasis, but pneumonia cannot be excluded.   Electronically Signed   By: Ivar Drape M.D.   On: 09/17/2014 10:22   Dg Fluoro Guide Cv Line-no Report  09/17/2014   CLINICAL DATA:    FLOURO GUIDE CV LINE  Fluoroscopy was utilized by the requesting physician.  No radiographic  interpretation.     ASSESSMENT: 59 y.o. [place] woman status post right breast biopsy 08/30/2014 for a clinical T2  N0, stage IIA invasive ductal carcinoma, grade 2, estrogen receptor2% "positive," progesterone receptor negative, HER-2 positive, with an MIB-1 of 81%.  (1) neoadjuvant chemotherapy will consist of carboplatin, docetaxel, trastuzumab and pertuzumab  (2) trastuzumab will be continued to complete 1 year  (3) surgery will follow chemotherapy  (4) adjuvant radiation will follow surgery  (5) consider anti-estrogens after radiation  (6) genetics testing pending  PLAN: I wanted to see Aleesa today to make sure she had a good understanding of the suggestions made at her last post chemotherapy visit her mother and I went ahead and copy dose and gave them to her in writing again. I suggested that perhaps we could set her up for intravenous fluids of for a few days if that would be helpful. I urged her to take the Questran together with the Imodium to control the diarrhea.  She understands that she will be losing her hair in the next few days. She already has several bandanna and hats ready. She is somewhat encouraged that this third week was much better than the other 2.  I am hopeful by optimizing supportive care we can get her through the 6 planned cycles of treatment. Otherwise however we would have to consider alternatives, such as going with Taxol, trastuzumab and pertuzumab alone, omitting the carboplatin and Taxotere, and then omitting the pertuzumab if necessary. This would be followed by surgery and then Cytoxan and doxorubicin after surgery. I did not discuss these options with her today because it would be much better if we could simply complete the treatment as planned.  She is agreeable to proceeding Monday. She knows to call for any problems that may develop before her next visit here.      Chauncey Cruel, MD   10/11/2014 1:35 PM

## 2014-10-14 ENCOUNTER — Ambulatory Visit (HOSPITAL_BASED_OUTPATIENT_CLINIC_OR_DEPARTMENT_OTHER): Payer: BC Managed Care – PPO

## 2014-10-14 ENCOUNTER — Encounter: Payer: Self-pay | Admitting: *Deleted

## 2014-10-14 ENCOUNTER — Other Ambulatory Visit: Payer: Self-pay | Admitting: Oncology

## 2014-10-14 VITALS — BP 119/76 | HR 89 | Temp 98.3°F | Resp 20

## 2014-10-14 DIAGNOSIS — Z5112 Encounter for antineoplastic immunotherapy: Secondary | ICD-10-CM | POA: Diagnosis not present

## 2014-10-14 DIAGNOSIS — C50811 Malignant neoplasm of overlapping sites of right female breast: Secondary | ICD-10-CM | POA: Diagnosis not present

## 2014-10-14 DIAGNOSIS — Z5111 Encounter for antineoplastic chemotherapy: Secondary | ICD-10-CM

## 2014-10-14 DIAGNOSIS — C50211 Malignant neoplasm of upper-inner quadrant of right female breast: Secondary | ICD-10-CM

## 2014-10-14 MED ORDER — SODIUM CHLORIDE 0.9 % IJ SOLN
10.0000 mL | INTRAMUSCULAR | Status: DC | PRN
Start: 2014-10-14 — End: 2014-10-14
  Administered 2014-10-14: 10 mL
  Filled 2014-10-14: qty 10

## 2014-10-14 MED ORDER — SODIUM CHLORIDE 0.9 % IV SOLN
Freq: Once | INTRAVENOUS | Status: AC
  Administered 2014-10-14: 11:00:00 via INTRAVENOUS
  Filled 2014-10-14: qty 8

## 2014-10-14 MED ORDER — SODIUM CHLORIDE 0.9 % IV SOLN
420.0000 mg | Freq: Once | INTRAVENOUS | Status: AC
  Administered 2014-10-14: 420 mg via INTRAVENOUS
  Filled 2014-10-14: qty 14

## 2014-10-14 MED ORDER — ACETAMINOPHEN 325 MG PO TABS
ORAL_TABLET | ORAL | Status: AC
  Filled 2014-10-14: qty 2

## 2014-10-14 MED ORDER — DIPHENHYDRAMINE HCL 25 MG PO CAPS
25.0000 mg | ORAL_CAPSULE | Freq: Once | ORAL | Status: AC
  Administered 2014-10-14: 25 mg via ORAL

## 2014-10-14 MED ORDER — TRASTUZUMAB CHEMO INJECTION 440 MG
6.0000 mg/kg | Freq: Once | INTRAVENOUS | Status: AC
  Administered 2014-10-14: 336 mg via INTRAVENOUS
  Filled 2014-10-14: qty 16

## 2014-10-14 MED ORDER — ACETAMINOPHEN 325 MG PO TABS
650.0000 mg | ORAL_TABLET | Freq: Once | ORAL | Status: AC
  Administered 2014-10-14: 650 mg via ORAL

## 2014-10-14 MED ORDER — CARBOPLATIN CHEMO INJECTION 600 MG/60ML
461.5000 mg | Freq: Once | INTRAVENOUS | Status: AC
  Administered 2014-10-14: 460 mg via INTRAVENOUS
  Filled 2014-10-14: qty 46

## 2014-10-14 MED ORDER — HEPARIN SOD (PORK) LOCK FLUSH 100 UNIT/ML IV SOLN
500.0000 [IU] | Freq: Once | INTRAVENOUS | Status: AC | PRN
  Administered 2014-10-14: 500 [IU]
  Filled 2014-10-14: qty 5

## 2014-10-14 MED ORDER — PEGFILGRASTIM 6 MG/0.6ML ~~LOC~~ PSKT
6.0000 mg | PREFILLED_SYRINGE | Freq: Once | SUBCUTANEOUS | Status: AC
  Administered 2014-10-14: 6 mg via SUBCUTANEOUS
  Filled 2014-10-14: qty 0.6

## 2014-10-14 MED ORDER — DIPHENHYDRAMINE HCL 25 MG PO CAPS
ORAL_CAPSULE | ORAL | Status: AC
  Filled 2014-10-14: qty 1

## 2014-10-14 MED ORDER — SODIUM CHLORIDE 0.9 % IV SOLN
Freq: Once | INTRAVENOUS | Status: AC
  Administered 2014-10-14: 08:00:00 via INTRAVENOUS

## 2014-10-14 MED ORDER — DOCETAXEL CHEMO INJECTION 160 MG/16ML
75.0000 mg/m2 | Freq: Once | INTRAVENOUS | Status: AC
  Administered 2014-10-14: 120 mg via INTRAVENOUS
  Filled 2014-10-14: qty 12

## 2014-10-14 NOTE — Patient Instructions (Signed)
[facility] Discharge Instructions for Patients Receiving Chemotherapy  Today you received the following chemotherapy agents Herceptin/Perjeta/Docetaxel/Carboplatin and On-Pro.  To help prevent nausea and vomiting after your treatment, we encourage you to take your nausea medication as directed.    If you develop nausea and vomiting that is not controlled by your nausea medication, call the clinic.   BELOW ARE SYMPTOMS THAT SHOULD BE REPORTED IMMEDIATELY:  *FEVER GREATER THAN 100.5 F  *CHILLS WITH OR WITHOUT FEVER  NAUSEA AND VOMITING THAT IS NOT CONTROLLED WITH YOUR NAUSEA MEDICATION  *UNUSUAL SHORTNESS OF BREATH  *UNUSUAL BRUISING OR BLEEDING  TENDERNESS IN MOUTH AND THROAT WITH OR WITHOUT PRESENCE OF ULCERS  *URINARY PROBLEMS  *BOWEL PROBLEMS  UNUSUAL RASH Items with * indicate a potential emergency and should be followed up as soon as possible.  Feel free to call the clinic you have any questions or concerns. The clinic phone number is [phone].  Please show the [facility] at check-in to the Emergency Department and triage nurse.  Trastuzumab injection for infusion What is this medicine? TRASTUZUMAB (tras TOO zoo mab) is a monoclonal antibody. It targets a protein called HER2. This protein is found in some stomach and breast cancers. This medicine can stop cancer cell growth. This medicine may be used with other cancer treatments. This medicine may be used for other purposes; ask your health care provider or pharmacist if you have questions. COMMON BRAND NAME(S): Herceptin What should I tell my health care provider before I take this medicine? They need to know if you have any of these conditions: -heart disease -heart failure -infection (especially a virus infection such as chickenpox, cold sores, or herpes) -lung or breathing disease, like asthma -recent or ongoing radiation therapy -an unusual or allergic reaction to trastuzumab, benzyl  alcohol, or other medications, foods, dyes, or preservatives -pregnant or trying to get pregnant -breast-feeding How should I use this medicine? This drug is given as an infusion into a vein. It is administered in a hospital or clinic by a specially trained health care professional. Talk to your pediatrician regarding the use of this medicine in children. This medicine is not approved for use in children. Overdosage: If you think you have taken too much of this medicine contact a poison control center or emergency room at once. NOTE: This medicine is only for you. Do not share this medicine with others. What if I miss a dose? It is important not to miss a dose. Call your doctor or health care professional if you are unable to keep an appointment. What may interact with this medicine? -cyclophosphamide -doxorubicin -warfarin This list may not describe all possible interactions. Give your health care provider a list of all the medicines, herbs, non-prescription drugs, or dietary supplements you use. Also tell them if you smoke, drink alcohol, or use illegal drugs. Some items may interact with your medicine. What should I watch for while using this medicine? Visit your doctor for checks on your progress. Report any side effects. Continue your course of treatment even though you feel ill unless your doctor tells you to stop. Call your doctor or health care professional for advice if you get a fever, chills or sore throat, or other symptoms of a cold or flu. Do not treat yourself. Try to avoid being around people who are sick. You may experience fever, chills and shaking during your first infusion. These effects are usually mild and can be treated with other medicines. Report any  side effects during the infusion to your health care professional. Fever and chills usually do not happen with later infusions. What side effects may I notice from receiving this medicine? Side effects that you should report  to your doctor or other health care professional as soon as possible: -breathing difficulties -chest pain or palpitations -cough -dizziness or fainting -fever or chills, sore throat -skin rash, itching or hives -swelling of the legs or ankles -unusually weak or tired Side effects that usually do not require medical attention (report to your doctor or other health care professional if they continue or are bothersome): -loss of appetite -headache -muscle aches -nausea This list may not describe all possible side effects. Call your doctor for medical advice about side effects. You may report side effects to FDA at 1-800-FDA-1088. Where should I keep my medicine? This drug is given in a hospital or clinic and will not be stored at home. NOTE: This sheet is a summary. It may not cover all possible information. If you have questions about this medicine, talk to your doctor, pharmacist, or health care provider.  2015, [employer] Standard. (2009-03-21 13:43:15)   Pertuzumab injection What is this medicine? PERTUZUMAB (per TOOZ ue mab) is a monoclonal antibody that targets a protein called HER2. HER2 is found in some breast cancers. This medicine can stop cancer cell growth. This medicine is used with other cancer treatments. This medicine may be used for other purposes; ask your health care provider or pharmacist if you have questions. COMMON BRAND NAME(S): PERJETA What should I tell my health care provider before I take this medicine? They need to know if you have any of these conditions: -heart disease -heart failure -high blood pressure -history of irregular heart beat -recent or ongoing radiation therapy -an unusual or allergic reaction to pertuzumab, other medicines, foods, dyes, or preservatives -pregnant or trying to get pregnant -breast-feeding How should I use this medicine? This medicine is for infusion into a vein. It is given by a health care professional in a hospital or  clinic setting. Talk to your pediatrician regarding the use of this medicine in children. Special care may be needed. Overdosage: If you think you've taken too much of this medicine contact a poison control center or emergency room at once. Overdosage: If you think you have taken too much of this medicine contact a poison control center or emergency room at once. NOTE: This medicine is only for you. Do not share this medicine with others. What if I miss a dose? It is important not to miss your dose. Call your doctor or health care professional if you are unable to keep an appointment. What may interact with this medicine? Interactions are not expected. Give your health care provider a list of all the medicines, herbs, non-prescription drugs, or dietary supplements you use. Also tell them if you smoke, drink alcohol, or use illegal drugs. Some items may interact with your medicine. This list may not describe all possible interactions. Give your health care provider a list of all the medicines, herbs, non-prescription drugs, or dietary supplements you use. Also tell them if you smoke, drink alcohol, or use illegal drugs. Some items may interact with your medicine. What should I watch for while using this medicine? Your condition will be monitored carefully while you are receiving this medicine. Report any side effects. Continue your course of treatment even though you feel ill unless your doctor tells you to stop. Do not become pregnant while taking this medicine. Women  should inform their doctor if they wish to become pregnant or think they might be pregnant. There is a potential for serious side effects to an unborn child. Talk to your health care professional or pharmacist for more information. Do not breast-feed an infant while taking this medicine. Call your doctor or health care professional for advice if you get a fever, chills or sore throat, or other symptoms of a cold or flu. Do not treat  yourself. Try to avoid being around people who are sick. You may experience fever, chills, and headache during the infusion. Report any side effects during the infusion to your health care professional. What side effects may I notice from receiving this medicine? Side effects that you should report to your doctor or health care professional as soon as possible: -breathing problems -chest pain or palpitations -dizziness -feeling faint or lightheaded -fever or chills -skin rash, itching or hives -sore throat -swelling of the face, lips, or tongue -swelling of the legs or ankles -unusually weak or tired Side effects that usually do not require medical attention (Report these to your doctor or health care professional if they continue or are bothersome.): -diarrhea -hair loss -nausea, vomiting -tiredness This list may not describe all possible side effects. Call your doctor for medical advice about side effects. You may report side effects to FDA at 1-800-FDA-1088. Where should I keep my medicine? This drug is given in a hospital or clinic and will not be stored at home. NOTE: This sheet is a summary. It may not cover all possible information. If you have questions about this medicine, talk to your doctor, pharmacist, or health care provider.  2015, [employer] Standard. (2012-03-15 16:54:15)   Docetaxel injection What is this medicine? DOCETAXEL (doe se TAX el) is a chemotherapy drug. It targets fast dividing cells, like cancer cells, and causes these cells to die. This medicine is used to treat many types of cancers like breast cancer, certain stomach cancers, head and neck cancer, lung cancer, and prostate cancer. This medicine may be used for other purposes; ask your health care provider or pharmacist if you have questions. COMMON BRAND NAME(S): Docefrez, Taxotere What should I tell my health care provider before I take this medicine? They need to know if you have any of these  conditions: -infection (especially a virus infection such as chickenpox, cold sores, or herpes) -liver disease -low blood counts, like low white cell, platelet, or red cell counts -an unusual or allergic reaction to docetaxel, polysorbate 80, other chemotherapy agents, other medicines, foods, dyes, or preservatives -pregnant or trying to get pregnant -breast-feeding How should I use this medicine? This drug is given as an infusion into a vein. It is administered in a hospital or clinic by a specially trained health care professional. Talk to your pediatrician regarding the use of this medicine in children. Special care may be needed. Overdosage: If you think you have taken too much of this medicine contact a poison control center or emergency room at once. NOTE: This medicine is only for you. Do not share this medicine with others. What if I miss a dose? It is important not to miss your dose. Call your doctor or health care professional if you are unable to keep an appointment. What may interact with this medicine? -cyclosporine -erythromycin -ketoconazole -medicines to increase blood counts like filgrastim, pegfilgrastim, sargramostim -vaccines Talk to your doctor or health care professional before taking any of these medicines: -acetaminophen -aspirin -ibuprofen -ketoprofen -naproxen This list may not  describe all possible interactions. Give your health care provider a list of all the medicines, herbs, non-prescription drugs, or dietary supplements you use. Also tell them if you smoke, drink alcohol, or use illegal drugs. Some items may interact with your medicine. What should I watch for while using this medicine? Your condition will be monitored carefully while you are receiving this medicine. You will need important blood work done while you are taking this medicine. This drug may make you feel generally unwell. This is not uncommon, as chemotherapy can affect healthy cells as well  as cancer cells. Report any side effects. Continue your course of treatment even though you feel ill unless your doctor tells you to stop. In some cases, you may be given additional medicines to help with side effects. Follow all directions for their use. Call your doctor or health care professional for advice if you get a fever, chills or sore throat, or other symptoms of a cold or flu. Do not treat yourself. This drug decreases your body's ability to fight infections. Try to avoid being around people who are sick. This medicine may increase your risk to bruise or bleed. Call your doctor or health care professional if you notice any unusual bleeding. Be careful brushing and flossing your teeth or using a toothpick because you may get an infection or bleed more easily. If you have any dental work done, tell your dentist you are receiving this medicine. Avoid taking products that contain aspirin, acetaminophen, ibuprofen, naproxen, or ketoprofen unless instructed by your doctor. These medicines may hide a fever. This medicine contains an alcohol in the product. You may get drowsy or dizzy. Do not drive, use machinery, or do anything that needs mental alertness until you know how this medicine affects you. Do not stand or sit up quickly, especially if you are an older patient. This reduces the risk of dizzy or fainting spells. Avoid alcoholic drinks Do not become pregnant while taking this medicine. Women should inform their doctor if they wish to become pregnant or think they might be pregnant. There is a potential for serious side effects to an unborn child. Talk to your health care professional or pharmacist for more information. Do not breast-feed an infant while taking this medicine. What side effects may I notice from receiving this medicine? Side effects that you should report to your doctor or health care professional as soon as possible: -allergic reactions like skin rash, itching or hives, swelling  of the face, lips, or tongue -low blood counts - This drug may decrease the number of white blood cells, red blood cells and platelets. You may be at increased risk for infections and bleeding. -signs of infection - fever or chills, cough, sore throat, pain or difficulty passing urine -signs of decreased platelets or bleeding - bruising, pinpoint red spots on the skin, black, tarry stools, nosebleeds -signs of decreased red blood cells - unusually weak or tired, fainting spells, lightheadedness -breathing problems -fast or irregular heartbeat -low blood pressure -mouth sores -nausea and vomiting -pain, swelling, redness or irritation at the injection site -pain, tingling, numbness in the hands or feet -swelling of the ankle, feet, hands -weight gain Side effects that usually do not require medical attention (report to your prescriber or health care professional if they continue or are bothersome): -bone pain -complete hair loss including hair on your head, underarms, pubic hair, eyebrows, and eyelashes -diarrhea -excessive tearing -changes in the color of fingernails -loosening of the fingernails -nausea -muscle pain -  red flush to skin -sweating -weak or tired This list may not describe all possible side effects. Call your doctor for medical advice about side effects. You may report side effects to FDA at 1-800-FDA-1088. Where should I keep my medicine? This drug is given in a hospital or clinic and will not be stored at home. NOTE: This sheet is a summary. It may not cover all possible information. If you have questions about this medicine, talk to your doctor, pharmacist, or health care provider.  2015, [employer] Standard. (2013-04-12 22:21:02)   Carboplatin injection What is this medicine? CARBOPLATIN (KAR boe pla tin) is a chemotherapy drug. It targets fast dividing cells, like cancer cells, and causes these cells to die. This medicine is used to treat ovarian cancer and  many other cancers. This medicine may be used for other purposes; ask your health care provider or pharmacist if you have questions. COMMON BRAND NAME(S): Paraplatin What should I tell my health care provider before I take this medicine? They need to know if you have any of these conditions: -blood disorders -hearing problems -kidney disease -recent or ongoing radiation therapy -an unusual or allergic reaction to carboplatin, cisplatin, other chemotherapy, other medicines, foods, dyes, or preservatives -pregnant or trying to get pregnant -breast-feeding How should I use this medicine? This drug is usually given as an infusion into a vein. It is administered in a hospital or clinic by a specially trained health care professional. Talk to your pediatrician regarding the use of this medicine in children. Special care may be needed. Overdosage: If you think you have taken too much of this medicine contact a poison control center or emergency room at once. NOTE: This medicine is only for you. Do not share this medicine with others. What if I miss a dose? It is important not to miss a dose. Call your doctor or health care professional if you are unable to keep an appointment. What may interact with this medicine? -medicines for seizures -medicines to increase blood counts like filgrastim, pegfilgrastim, sargramostim -some antibiotics like amikacin, gentamicin, neomycin, streptomycin, tobramycin -vaccines Talk to your doctor or health care professional before taking any of these medicines: -acetaminophen -aspirin -ibuprofen -ketoprofen -naproxen This list may not describe all possible interactions. Give your health care provider a list of all the medicines, herbs, non-prescription drugs, or dietary supplements you use. Also tell them if you smoke, drink alcohol, or use illegal drugs. Some items may interact with your medicine. What should I watch for while using this medicine? Your condition  will be monitored carefully while you are receiving this medicine. You will need important blood work done while you are taking this medicine. This drug may make you feel generally unwell. This is not uncommon, as chemotherapy can affect healthy cells as well as cancer cells. Report any side effects. Continue your course of treatment even though you feel ill unless your doctor tells you to stop. In some cases, you may be given additional medicines to help with side effects. Follow all directions for their use. Call your doctor or health care professional for advice if you get a fever, chills or sore throat, or other symptoms of a cold or flu. Do not treat yourself. This drug decreases your body's ability to fight infections. Try to avoid being around people who are sick. This medicine may increase your risk to bruise or bleed. Call your doctor or health care professional if you notice any unusual bleeding. Be careful brushing and flossing your teeth  or using a toothpick because you may get an infection or bleed more easily. If you have any dental work done, tell your dentist you are receiving this medicine. Avoid taking products that contain aspirin, acetaminophen, ibuprofen, naproxen, or ketoprofen unless instructed by your doctor. These medicines may hide a fever. Do not become pregnant while taking this medicine. Women should inform their doctor if they wish to become pregnant or think they might be pregnant. There is a potential for serious side effects to an unborn child. Talk to your health care professional or pharmacist for more information. Do not breast-feed an infant while taking this medicine. What side effects may I notice from receiving this medicine? Side effects that you should report to your doctor or health care professional as soon as possible: -allergic reactions like skin rash, itching or hives, swelling of the face, lips, or tongue -signs of infection - fever or chills, cough, sore  throat, pain or difficulty passing urine -signs of decreased platelets or bleeding - bruising, pinpoint red spots on the skin, black, tarry stools, nosebleeds -signs of decreased red blood cells - unusually weak or tired, fainting spells, lightheadedness -breathing problems -changes in hearing -changes in vision -chest pain -high blood pressure -low blood counts - This drug may decrease the number of white blood cells, red blood cells and platelets. You may be at increased risk for infections and bleeding. -nausea and vomiting -pain, swelling, redness or irritation at the injection site -pain, tingling, numbness in the hands or feet -problems with balance, talking, walking -trouble passing urine or change in the amount of urine Side effects that usually do not require medical attention (report to your doctor or health care professional if they continue or are bothersome): -hair loss -loss of appetite -metallic taste in the mouth or changes in taste This list may not describe all possible side effects. Call your doctor for medical advice about side effects. You may report side effects to FDA at 1-800-FDA-1088. Where should I keep my medicine? This drug is given in a hospital or clinic and will not be stored at home. NOTE: This sheet is a summary. It may not cover all possible information. If you have questions about this medicine, talk to your doctor, pharmacist, or health care provider.  2015, [employer] Standard. (2007-08-22 14:38:05)

## 2014-10-14 NOTE — Progress Notes (Signed)
Met with patient with her 2nd cycle chemo.  She had rough time after her 1st treatment.   She states she was really sick.  She is scheduled for IVF's this time for few days after her chemo so this will hopefully help.  Gave her contact information because she stated her sister had kept all of our information.

## 2014-10-16 ENCOUNTER — Telehealth: Payer: Self-pay | Admitting: *Deleted

## 2014-10-16 ENCOUNTER — Ambulatory Visit: Payer: BC Managed Care – PPO

## 2014-10-16 NOTE — Telephone Encounter (Signed)
Per staff message and POF I have scheduled appts. Advised scheduler of appts. JMW  

## 2014-10-17 ENCOUNTER — Encounter: Payer: Self-pay | Admitting: Genetic Counselor

## 2014-10-17 ENCOUNTER — Telehealth: Payer: Self-pay | Admitting: *Deleted

## 2014-10-17 ENCOUNTER — Ambulatory Visit (HOSPITAL_BASED_OUTPATIENT_CLINIC_OR_DEPARTMENT_OTHER): Payer: BC Managed Care – PPO

## 2014-10-17 VITALS — BP 120/67 | HR 77 | Temp 98.4°F | Resp 18

## 2014-10-17 DIAGNOSIS — C50811 Malignant neoplasm of overlapping sites of right female breast: Secondary | ICD-10-CM

## 2014-10-17 DIAGNOSIS — R197 Diarrhea, unspecified: Secondary | ICD-10-CM | POA: Diagnosis not present

## 2014-10-17 DIAGNOSIS — Z800 Family history of malignant neoplasm of digestive organs: Secondary | ICD-10-CM

## 2014-10-17 DIAGNOSIS — C50211 Malignant neoplasm of upper-inner quadrant of right female breast: Secondary | ICD-10-CM

## 2014-10-17 DIAGNOSIS — Z8041 Family history of malignant neoplasm of ovary: Secondary | ICD-10-CM

## 2014-10-17 MED ORDER — HEPARIN SOD (PORK) LOCK FLUSH 100 UNIT/ML IV SOLN
500.0000 [IU] | Freq: Once | INTRAVENOUS | Status: AC | PRN
  Administered 2014-10-17: 500 [IU]
  Filled 2014-10-17: qty 5

## 2014-10-17 MED ORDER — SODIUM CHLORIDE 0.9 % IV SOLN
Freq: Once | INTRAVENOUS | Status: AC
  Administered 2014-10-17: 14:00:00 via INTRAVENOUS

## 2014-10-17 MED ORDER — SODIUM CHLORIDE 0.9 % IJ SOLN
10.0000 mL | INTRAMUSCULAR | Status: DC | PRN
  Administered 2014-10-17: 10 mL
  Filled 2014-10-17: qty 10

## 2014-10-17 NOTE — Telephone Encounter (Signed)
Per chemo RN patient canceled appts for Friday and Saturday

## 2014-10-17 NOTE — Patient Instructions (Signed)
Dehydration, Adult Dehydration is when you lose more fluids from the body than you take in. Vital organs like the kidneys, brain, and heart cannot function without a proper amount of fluids and salt. Any loss of fluids from the body can cause dehydration.  CAUSES   Vomiting.  Diarrhea.  Excessive sweating.  Excessive urine output.  Fever. SYMPTOMS  Mild dehydration  Thirst.  Dry lips.  Slightly dry mouth. Moderate dehydration  Very dry mouth.  Sunken eyes.  Skin does not bounce back quickly when lightly pinched and released.  Dark urine and decreased urine production.  Decreased tear production.  Headache. Severe dehydration  Very dry mouth.  Extreme thirst.  Rapid, weak pulse (more than 100 beats per minute at rest).  Cold hands and feet.  Not able to sweat in spite of heat and temperature.  Rapid breathing.  Blue lips.  Confusion and lethargy.  Difficulty being awakened.  Minimal urine production.  No tears. DIAGNOSIS  Your caregiver will diagnose dehydration based on your symptoms and your exam. Blood and urine tests will help confirm the diagnosis. The diagnostic evaluation should also identify the cause of dehydration. TREATMENT  Treatment of mild or moderate dehydration can often be done at home by increasing the amount of fluids that you drink. It is best to drink small amounts of fluid more often. Drinking too much at one time can make vomiting worse. Refer to the home care instructions below. Severe dehydration needs to be treated at the hospital where you will probably be given intravenous (IV) fluids that contain water and electrolytes. HOME CARE INSTRUCTIONS   Ask your caregiver about specific rehydration instructions.  Drink enough fluids to keep your urine clear or pale yellow.  Drink small amounts frequently if you have nausea and vomiting.  Eat as you normally do.  Avoid:  Foods or drinks high in sugar.  Carbonated  drinks.  Juice.  Extremely hot or cold fluids.  Drinks with caffeine.  Fatty, greasy foods.  Alcohol.  Tobacco.  Overeating.  Gelatin desserts.  Wash your hands well to avoid spreading bacteria and viruses.  Only take over-the-counter or prescription medicines for pain, discomfort, or fever as directed by your caregiver.  Ask your caregiver if you should continue all prescribed and over-the-counter medicines.  Keep all follow-up appointments with your caregiver. SEEK MEDICAL CARE IF:  You have abdominal pain and it increases or stays in one area (localizes).  You have a rash, stiff neck, or severe headache.  You are irritable, sleepy, or difficult to awaken.  You are weak, dizzy, or extremely thirsty. SEEK IMMEDIATE MEDICAL CARE IF:   You are unable to keep fluids down or you get worse despite treatment.  You have frequent episodes of vomiting or diarrhea.  You have blood or green matter (bile) in your vomit.  You have blood in your stool or your stool looks black and tarry.  You have not urinated in 6 to 8 hours, or you have only urinated a small amount of very dark urine.  You have a fever.  You faint. MAKE SURE YOU:   Understand these instructions.  Will watch your condition.  Will get help right away if you are not doing well or get worse. Document Released: 05/17/2005 Document Revised: 08/09/2011 Document Reviewed: 01/04/2011 ExitCare Patient Information 2015 [employer]. This information is not intended to replace advice given to you by your health care provider. Make sure you discuss any questions you have with your health care   provider.  

## 2014-10-17 NOTE — Progress Notes (Signed)
[facility] Genetic Test Results   REFERRING PROVIDER: Dr. Lurline Del  PRIMARY PROVIDER:  [name], MD  GENETIC TEST RESULT:  Testing Laboratory: [facility]  Test Ordered: OvaNext gene panel Date of Report: 10/10/2014 Result: Normal, no pathogenic mutations identified General Interpretation: Reassuring  HPI: Ms. Provencal was previously seen in the [facility] due to concerns regarding a hereditary predisposition to cancer. Please refer to our prior cancer genetics clinic note for more information regarding Ms. Cromie's medical, social and family histories, and our assessment and recommendations, at the time. Ms. Addis genetic test results and recommendations warranted by these results were recently disclosed to her and are discussed in more detail below.  GENETIC TEST RESULTS: At the time of Ms. Giannattasio's visit, we recommended she pursue genetic testing, which includes sequencing and deletion/duplication analysis of several genes associated with an increased risk for cancer via a gene panel. The OvaNext gene panel offered by [employer] includes sequencing and rearrangement analysis for the following 24 genes:ATM, BARD1, BRCA1, BRCA2, BRIP1, CDH1, CHEK2, EPCAM, MLH1, MRE11A, MSH2, MSH6, MUTYH, NBN, NF1, PALB2, PMS2, PTEN, RAD50, RAD51C, RAD51D, SMARCA4, STK11, and TP53. Genetic testing for this gene panel was normal and did not reveal a pathogenic mutation in any of these genes. A copy of the genetic test report will be scanned into Epic under the media tab.   We discussed with Ms. Syverson that current genetic testing is not perfect, and it is, therefore, possible there may be a pathogenic gene mutation in one of these genes that current testing cannot detect, but that chance is small.  We also discussed, that it is possible that another gene that has not yet been discovered, or that we have not yet tested, is  responsible for the cancer diagnoses in her family. It is, therefore, important for Ms. Strnad to continue to remain in touch with cancer genetics so that we can continue to offer Ms. Marcellus the most up to date genetic testing.   CANCER SCREENING RECOMMENDATIONS: This result is reassuring and indicates that Ms. Bickley likely does not have an increased risk for a future cancer due to a mutation in one of these genes. This normal test also suggests that Ms. Smyser's cancer was most likely not due to an inherited predisposition associated with one of these genes.  Most cancers happen by chance and this negative test suggests that her cancer falls into this category.  We, therefore, recommended she continue to follow the cancer management and screening guidelines provided by her oncology and primary healthcare providers.   RECOMMENDATIONS FOR FAMILY MEMBERS: Individuals in this family might be at some increased risk of developing cancer, over the general population risk, simply due to the family history of cancer.  We recommended women in this family have a yearly mammogram beginning at age 40, an annual clinical breast exam, and perform monthly breast self-exams. Women in this family should also have a gynecological exam as recommended by their primary provider. All family members should have a colonoscopy by age 58, an annual dermatological exam and continue to follow cancer screening guidelines recommended by their healthcare provider.  FOLLOW-UP: Lastly, we discussed with Ms. Kronk that cancer genetics is a rapidly advancing field and it is likely that new genetic tests will be appropriate for her and/or family members in the future. We encouraged her to remain in contact with cancer genetics on an annual basis so we can update  her personal and family histories and let her know of advances in cancer genetics that may benefit this family.   Our contact number was provided. Ms. Sharrow questions were  answered to her satisfaction, and she knows she is welcome to call us at anytime with additional questions or concerns.    Catherine A. Fine, MS, CGC Certified Psychologist, [email or web address] Phone: [phone]

## 2014-10-18 ENCOUNTER — Other Ambulatory Visit: Payer: Self-pay | Admitting: *Deleted

## 2014-10-18 ENCOUNTER — Ambulatory Visit: Payer: BC Managed Care – PPO

## 2014-10-18 DIAGNOSIS — C50211 Malignant neoplasm of upper-inner quadrant of right female breast: Secondary | ICD-10-CM

## 2014-10-19 ENCOUNTER — Ambulatory Visit: Payer: BC Managed Care – PPO

## 2014-10-21 ENCOUNTER — Telehealth: Payer: Self-pay | Admitting: *Deleted

## 2014-10-21 ENCOUNTER — Ambulatory Visit (HOSPITAL_BASED_OUTPATIENT_CLINIC_OR_DEPARTMENT_OTHER): Payer: BC Managed Care – PPO | Admitting: Nurse Practitioner

## 2014-10-21 ENCOUNTER — Other Ambulatory Visit (HOSPITAL_BASED_OUTPATIENT_CLINIC_OR_DEPARTMENT_OTHER): Payer: BC Managed Care – PPO

## 2014-10-21 ENCOUNTER — Encounter: Payer: Self-pay | Admitting: Nurse Practitioner

## 2014-10-21 VITALS — BP 122/83 | HR 115 | Temp 98.3°F | Resp 20 | Wt 118.3 lb

## 2014-10-21 DIAGNOSIS — C50211 Malignant neoplasm of upper-inner quadrant of right female breast: Secondary | ICD-10-CM

## 2014-10-21 DIAGNOSIS — K521 Toxic gastroenteritis and colitis: Secondary | ICD-10-CM

## 2014-10-21 DIAGNOSIS — C50811 Malignant neoplasm of overlapping sites of right female breast: Secondary | ICD-10-CM

## 2014-10-21 DIAGNOSIS — R112 Nausea with vomiting, unspecified: Secondary | ICD-10-CM

## 2014-10-21 DIAGNOSIS — G620 Drug-induced polyneuropathy: Secondary | ICD-10-CM | POA: Diagnosis not present

## 2014-10-21 DIAGNOSIS — T451X5A Adverse effect of antineoplastic and immunosuppressive drugs, initial encounter: Secondary | ICD-10-CM | POA: Insufficient documentation

## 2014-10-21 LAB — CBC WITH DIFFERENTIAL/PLATELET
BASO%: 0.1 % (ref 0.0–2.0)
Basophils Absolute: 0 10*3/uL (ref 0.0–0.1)
EOS%: 2 % (ref 0.0–7.0)
Eosinophils Absolute: 0.1 10*3/uL (ref 0.0–0.5)
HEMATOCRIT: 36.6 % (ref 34.8–46.6)
HEMOGLOBIN: 12.9 g/dL (ref 11.6–15.9)
LYMPH%: 19.8 % (ref 14.0–49.7)
MCH: 33.2 pg (ref 25.1–34.0)
MCHC: 35.2 g/dL (ref 31.5–36.0)
MCV: 94.3 fL (ref 79.5–101.0)
MONO#: 1.1 10*3/uL — ABNORMAL HIGH (ref 0.1–0.9)
MONO%: 15.7 % — ABNORMAL HIGH (ref 0.0–14.0)
NEUT#: 4.5 10*3/uL (ref 1.5–6.5)
NEUT%: 62.4 % (ref 38.4–76.8)
NRBC: 0 % (ref 0–0)
PLATELETS: 115 10*3/uL — AB (ref 145–400)
RBC: 3.88 10*6/uL (ref 3.70–5.45)
RDW: 12.6 % (ref 11.2–14.5)
WBC: 7.1 10*3/uL (ref 3.9–10.3)
lymph#: 1.4 10*3/uL (ref 0.9–3.3)

## 2014-10-21 LAB — COMPREHENSIVE METABOLIC PANEL (CC13)
ALBUMIN: 4 g/dL (ref 3.5–5.0)
ALT: 21 U/L (ref 0–55)
AST: 19 U/L (ref 5–34)
Alkaline Phosphatase: 99 U/L (ref 40–150)
Anion Gap: 15 mEq/L — ABNORMAL HIGH (ref 3–11)
BILIRUBIN TOTAL: 0.39 mg/dL (ref 0.20–1.20)
BUN: 9.6 mg/dL (ref 7.0–26.0)
CO2: 25 mEq/L (ref 22–29)
Calcium: 9.1 mg/dL (ref 8.4–10.4)
Chloride: 97 mEq/L — ABNORMAL LOW (ref 98–109)
Creatinine: 0.9 mg/dL (ref 0.6–1.1)
EGFR: 69 mL/min/{1.73_m2} — ABNORMAL LOW (ref >90)
Glucose: 123 mg/dl (ref 70–140)
Potassium: 3.7 mEq/L (ref 3.5–5.1)
SODIUM: 136 meq/L (ref 136–145)
Total Protein: 7.1 g/dL (ref 6.4–8.3)

## 2014-10-21 NOTE — Progress Notes (Signed)
[facility]  Telephone:([phone] Fax:([phone]) [phone]     ID: [name] DOB: 10/25/1955  MR#: [id number]  JJO#:[id number]  Patient Care Team: Darlyne Russian, MD as PCP - General (Family Medicine) [name], MD as Consulting Physician (General Surgery) Chauncey Cruel, MD as Consulting Physician (Oncology) Eppie Gibson, MD as Attending Physician (Radiation Oncology) Rockwell Germany, RN as Registered Nurse Mauro Kaufmann, RN as Registered Nurse PCP: [name], MD OTHER MD: Jamie Kato MD  CHIEF COMPLAINT: HER-2 positive breast cancer  CURRENT TREATMENT: Neoadjuvant chemotherapy and anti-HER-2 immunotherapy   BREAST CANCER HISTORY: From the original intake note:  Beth Rice herself palpated a mass in her right breast but "didn't think much about it". She saw [name] for routine gynecologic follow-up and he also palpated and immediately set her up for right diagnostic mammography with tomosynthesis and ultrasonography at the [facility] Center 08/27/2014. In the upper outer quadrant of the right breast there was a spiculated mass accompanied by calcifications, the complex extending up to 2.5 cm. This was palpable and mobile. It measured approximately 2 cm by palpation. There was no palpable right axillary adenopathy. Ultrasound of the right axilla also showed normal axillary contents.  Ultrasound of the right breast confirmed an irregularly marginated hypoechoic mass measuring 1.8 cm. Biopsy was not immediately performed because of patient was on aspirin at the time. On 08/30/2014 [name] underwent biopsy of the mass in question, with the pathology ([id number]) (showing an invasive ductal carcinoma, grade 2, estrogen receptor 2% positive with moderate staining intensity, progesterone receptor negative, with an MIB-1 of 81% and with HER-2 amplified, the signals ratio being 2.87 and the number per cell 4.30.  On 09/03/2024 she underwent bilateral breast MRI. This  found the breast composition to be category C. In the right breast there was a spiculated mass measuring 2.3 cm in close proximity to the pectoralis but without obvious invasion of the muscle. The rest of the breast, the left breast, and the lymph node areas well otherwise unremarkable.  Her subsequent history is as detailed below   INTERVAL HISTORY: Beth Rice returns for follow up of her breast cancer, accompanied by a friend. Today is day 8, cycle 2 of 6 planned cycles of of carboplatin, docetaxel, trastuzumab and pertuzumab given every 3 weeks with Onpro support.  REVIEW OF SYSTEMS: Toleen had her hair buzzed and she is somewhat self conscious about it because people stare. She felt well this cycle up until day 5. On that day she extraordinary constipation, with a stool "as hard a corn cob". Her sister advised her to take 2 sennakot tablets, and since then she has had diarrhea with greater than 3 stools daily. Her nausea was well managed until yesterday when she vomited several times after coffee. She took dexamethasone for this instead of her PRN antiemetics, but it helped. Her vision is now blurred. Her taste has changed for the worse and she is not eating well, but supplements with Boost and tries to get her water intake in. She is eating with plastic utensils. She is complaining of persistent tingling to her bilateral fingertips. Her energy level is low. She has a few papules to her right index,  left middle finger, upper lip. These are asymptomatic. A detailed review of systems is otherwise stable.  PAST MEDICAL HISTORY: Past Medical History  Diagnosis Date  . Anxiety   . Elevated cholesterol   . Depression   . Chest pain  a. 09/2008 Cath: ER 60%, LM nl, LAD 55m, 27m/d, LCX nondom, nl, RCA dom, 47m, PDA/PLA nl.  . Tobacco abuse   . H/O degenerative disc disease   . Myocardial infarction     "I think I had a heart attack in 2010"  . Headache     "frequency depends on the weather"  (06/04/2014)  . Arthritis     "neck" (06/04/2014)  . Chronic lower back pain   . Gastroesophageal reflux disease     H/O  . Hot flashes     PAST SURGICAL HISTORY: Past Surgical History  Procedure Laterality Date  . Ovarian cyst removal  1980's?  . Tonsillectomy    . Cardiac catheterization  10/15/2008    noncritical mid & distal third anterior descending disease  . Portacath placement Left 09/17/2014    Procedure: INSERTION PORT-A-CATH;  Surgeon: [name], MD;  Location: [facility];  Service: General;  Laterality: Left;    FAMILY HISTORY Family History  Problem Relation Age of Onset  . Hypertension Sister   . Heart disease Mother   . Alzheimer's disease Mother   . Cerebral aneurysm Father   . Mental illness Father   . Cancer Maternal Aunt 45    ovarian  . Cancer Other 45    mat great aunt (through [facility]) with colorectal cancer  . Cancer Other 67    mat great uncle (through [facility]) with prostate cancer  . Cancer Cousin 63    paternal female first cousin through an uncle with pancreatic cancer  The patient's father died at the age of 40 after having cerebral hemorrhages and age 66 and 24. The patient's mother died at the age of 63 with Parkinson's and Alzheimer's disease. The patient's mother had one sister and this sister was diagnosed with ovarian cancer in her early 73s. There is no other history of breast or ovarian cancer in the family.   GYNECOLOGIC HISTORY:  No LMP recorded. Patient is postmenopausal.  menarche age 14, the patient is GX P0. She stopped having periods in 2005. She did not take hormone replacement.   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 She is in process of retiring, with targeted retirement date in June. She is single, lives alone with 4 dogs.    ADVANCED DIRECTIVES: Not in place. She tells me she plans to name her sister Beth Rice as her healthcare power of  attorney. [name] (who is a Marine scientist) can be reached in [place] at [phone] (cell) or [phone] (home).   HEALTH MAINTENANCE: History  Substance Use Topics  . Smoking status: Former Smoker -- 1.00 packs/day for 20 years    Types: Cigarettes    Quit date: 06/21/2014  . Smokeless tobacco: Never Used  . Alcohol Use: 1.8 oz/week    3 Cans of beer, 0 Standard drinks or equivalent per week     Colonoscopy: 2006/[facility]  NUU:VOZDG 2016   Bone density:09/10/[facility]/normal   Lipid panel:  Allergies  Allergen Reactions  . Penicillins Hives  . Statins     Patient has significant muscle cramps with statins. She cannot tolerate them.  . Sulfa Antibiotics Hives    Current Outpatient Prescriptions  Medication Sig Dispense Refill  . acyclovir (ZOVIRAX) 400 MG tablet Take 1 tablet (400 mg total) by mouth 2 (two) times daily. 60 tablet 3  . aspirin EC 81 MG tablet Take 81 mg by mouth daily.    Marland Kitchen  b complex vitamins capsule Take 1 capsule by mouth at bedtime.     . Calcium Carbonate-Vitamin D (CALCIUM + D PO) Take 1 tablet by mouth at bedtime.     . clonazePAM (KLONOPIN) 1 MG tablet TAKE 1 TABLET BY MOUTH AT BEDTIME AS NEEDED FOR ANXIETY 90 tablet 0  . dexamethasone (DECADRON) 4 MG tablet Take 2 tablets (8 mg total) by mouth 2 (two) times daily. Start the day before Taxotere. Then again the day after chemo for 3 days. 30 tablet 1  . ezetimibe (ZETIA) 10 MG tablet Take 1 tablet (10 mg total) by mouth daily. 30 tablet 11  . fish oil-omega-3 fatty acids 1000 MG capsule Take 1 g by mouth at bedtime.     . fluconazole (DIFLUCAN) 100 MG tablet Take 1 tablet (100 mg total) by mouth daily. 10 tablet 3  . levocetirizine (XYZAL) 5 MG tablet TAKE 1 TABLET (5 MG TOTAL) BY MOUTH EVERY EVENING. 30 tablet 3  . Lysine 500 MG CAPS Take 500 mg by mouth at bedtime.     . Multiple Vitamins-Minerals (MULTIVITAMIN WITH MINERALS) tablet Take 1 tablet by mouth at bedtime.     Marland Kitchen  omeprazole (PRILOSEC) 40 MG capsule Take 1 capsule (40 mg total) by mouth daily. 30 capsule 2  . ranitidine (ZANTAC) 150 MG tablet TAKE 1 TABLET (150 MG TOTAL) BY MOUTH 2 (TWO) TIMES DAILY. 60 tablet 3  . sertraline (ZOLOFT) 50 MG tablet Take 1 tablet (50 mg total) by mouth daily. 30 tablet 11  . ALPRAZolam (XANAX) 0.5 MG tablet TAKE 1 TABLET BY MOUTH EVERY 6 HOURS AS NEEDED FOR MUSCLE SPASMS (Patient not taking: Reported on 10/01/2014) 30 tablet 2  . cholestyramine (QUESTRAN) 4 G packet Take 1 packet (4 g total) by mouth 2 (two) times daily. (Patient not taking: Reported on 10/21/2014) 60 each 1  . fluticasone (FLONASE) 50 MCG/ACT nasal spray Place 2 sprays into both nostrils daily. (Patient not taking: Reported on 10/01/2014) 16 g 11  . lidocaine-prilocaine (EMLA) cream Apply to affected area once (Patient not taking: Reported on 10/01/2014) 30 g 3  . LORazepam (ATIVAN) 0.5 MG tablet Take 1 tablet (0.5 mg total) by mouth every 6 (six) hours as needed (Nausea or vomiting). (Patient not taking: Reported on 10/01/2014) 30 tablet 0  . ondansetron (ZOFRAN) 8 MG tablet Take 1 tablet (8 mg total) by mouth 2 (two) times daily. Start the day after chemo for 3 days. Then take as needed for nausea or vomiting. (Patient not taking: Reported on 10/01/2014) 30 tablet 1  . oxyCODONE-acetaminophen (ROXICET) 5-325 MG per tablet Take 1-2 tablets by mouth every 4 (four) hours as needed for severe pain. (Patient not taking: Reported on 10/21/2014) 30 tablet 0  . prochlorperazine (COMPAZINE) 10 MG tablet Take 1 tablet (10 mg total) by mouth every 6 (six) hours as needed (Nausea or vomiting). (Patient not taking: Reported on 10/21/2014) 30 tablet 1  . Salicylic Acid 3 % SHAM Apply 1 application topically daily. (Patient not taking: Reported on 09/16/2014) 177 mL 0   No current facility-administered medications for this visit.    OBJECTIVE: middle-aged white woman who appears stated age 59 Vitals:   10/21/14 0924  BP: 122/83    Pulse: 115  Temp: 98.3 F (36.8 C)  Resp: 20     Body mass index is 21.63 kg/(m^2).    ECOG FS:1 - Symptomatic but completely ambulatory  Skin: warm, dry  HEENT: sclerae anicteric, conjunctivae pink, oropharynx clear. No  thrush or mucositis.  Lymph Nodes: No cervical or supraclavicular lymphadenopathy  Lungs: clear to auscultation bilaterally, no rales, wheezes, or rhonci  Heart: regular rate and rhythm  Abdomen: round, soft, non tender, positive bowel sounds  Musculoskeletal: No focal spinal tenderness, no peripheral edema  Neuro: non focal, well oriented, positive affect  Breasts: deferred    LAB RESULTS:  CMP     Component Value Date/Time   NA 136 10/21/2014 0912   NA 139 06/18/2014 1133   K 3.7 10/21/2014 0912   K 4.2 06/18/2014 1133   CL 103 06/18/2014 1133   CO2 25 10/21/2014 0912   CO2 27 06/18/2014 1133   GLUCOSE 123 10/21/2014 0912   GLUCOSE 93 06/18/2014 1133   BUN 9.6 10/21/2014 0912   BUN 7 06/18/2014 1133   CREATININE 0.9 10/21/2014 0912   CREATININE 0.73 06/18/2014 1133   CREATININE 0.64 06/03/2014 1203   CALCIUM 9.1 10/21/2014 0912   CALCIUM 9.3 06/18/2014 1133   PROT 7.1 10/21/2014 0912   PROT 6.8 10/23/2013 1030   ALBUMIN 4.0 10/21/2014 0912   ALBUMIN 4.5 10/23/2013 1030   AST 19 10/21/2014 0912   AST 25 10/23/2013 1030   ALT 21 10/21/2014 0912   ALT 16 10/23/2013 1030   ALKPHOS 99 10/21/2014 0912   ALKPHOS 68 10/23/2013 1030   BILITOT 0.39 10/21/2014 0912   BILITOT 0.7 10/23/2013 1030   GFRNONAA >90 06/03/2014 1203   GFRNONAA >89 10/23/2013 1030   GFRAA >90 06/03/2014 1203   GFRAA >89 10/23/2013 1030    INo results found for: SPEP, UPEP  Lab Results  Component Value Date   WBC 7.1 10/21/2014   NEUTROABS 4.5 10/21/2014   HGB 12.9 10/21/2014   HCT 36.6 10/21/2014   MCV 94.3 10/21/2014   PLT 115* 10/21/2014      Chemistry      Component Value Date/Time   NA 136 10/21/2014 0912   NA 139 06/18/2014 1133   K 3.7 10/21/2014 0912    K 4.2 06/18/2014 1133   CL 103 06/18/2014 1133   CO2 25 10/21/2014 0912   CO2 27 06/18/2014 1133   BUN 9.6 10/21/2014 0912   BUN 7 06/18/2014 1133   CREATININE 0.9 10/21/2014 0912   CREATININE 0.73 06/18/2014 1133   CREATININE 0.64 06/03/2014 1203      Component Value Date/Time   CALCIUM 9.1 10/21/2014 0912   CALCIUM 9.3 06/18/2014 1133   ALKPHOS 99 10/21/2014 0912   ALKPHOS 68 10/23/2013 1030   AST 19 10/21/2014 0912   AST 25 10/23/2013 1030   ALT 21 10/21/2014 0912   ALT 16 10/23/2013 1030   BILITOT 0.39 10/21/2014 0912   BILITOT 0.7 10/23/2013 1030       No results found for: LABCA2  No components found for: LABCA125  No results for input(s): INR in the last 168 hours.  Urinalysis    Component Value Date/Time   COLORURINE CANCELED 08/21/2014 1204   APPEARANCEUR CANCELED 08/21/2014 1204   LABSPEC CANCELED 08/21/2014 1204   PHURINE CANCELED 08/21/2014 1204   GLUCOSEU CANCELED 08/21/2014 1204   HGBUR CANCELED 08/21/2014 1204   BILIRUBINUR CANCELED 08/21/2014 1204   BILIRUBINUR neg 06/18/2014 1103   KETONESUR CANCELED 08/21/2014 1204   PROTEINUR CANCELED 08/21/2014 1204   PROTEINUR neg 06/18/2014 1103   UROBILINOGEN CANCELED 08/21/2014 1204   UROBILINOGEN 0.2 06/18/2014 1103   NITRITE CANCELED 08/21/2014 1204   NITRITE neg 06/18/2014 1103   LEUKOCYTESUR CANCELED 08/21/2014 1204    STUDIES:  No results found.  ASSESSMENT: 59 y.o. [place] woman status post right breast biopsy 08/30/2014 for a clinical T2 N0, stage IIA invasive ductal carcinoma, grade 2, estrogen receptor2% "positive," progesterone receptor negative, HER-2 positive, with an MIB-1 of 81%.  (1) neoadjuvant chemotherapy will consist of carboplatin, docetaxel, trastuzumab and pertuzumab beginning 09/23/14  (2) trastuzumab will be continued to complete 1 year  (3) surgery will follow chemotherapy  (4) adjuvant radiation will follow surgery  (5) consider anti-estrogens after radiation  (6)  genetics testing pending  PLAN: [name] and I reviewed her antiemetic schedule. Instead of reaching for the dexamethasone, she will employ the use of zofran and compazine as advised, though nausea not a major issue for her. If we can get her bowel movements under control, I think she would fare better. We talked about using imodium conservatively for her loose bowel movements, and she will discontinue use when she gets back to normal. A stool softener or 2 daily for times where she is having firm stools should do enough without sending her back into diarrhea. She will of course increase her fluid intake and will continue her protein supplement shakes. The labs were reviewed in detail and were entirely stable.  Johnella will return in 2 weeks for labs and her next cycle of treatment. We may have to remove the docetaxel if her neuropathy symptoms continue. She understands and agrees with this plan. She knows the goal of treatment in her case is cure. She has been encouraged to call with any issues that might arise before her next visit here.     Laurie Panda, NP   10/21/2014 10:36 AM

## 2014-10-21 NOTE — Telephone Encounter (Signed)
Per staff message and POF I have scheduled appts. Advised scheduler of appts. JMW  

## 2014-10-24 NOTE — Addendum Note (Signed)
Addended by: Marcelino Duster on: 10/24/2014 12:07 PM   Modules accepted: Orders

## 2014-11-04 ENCOUNTER — Other Ambulatory Visit: Payer: Self-pay | Admitting: Oncology

## 2014-11-04 ENCOUNTER — Ambulatory Visit (HOSPITAL_BASED_OUTPATIENT_CLINIC_OR_DEPARTMENT_OTHER): Payer: BC Managed Care – PPO

## 2014-11-04 ENCOUNTER — Telehealth: Payer: Self-pay | Admitting: Oncology

## 2014-11-04 ENCOUNTER — Ambulatory Visit (HOSPITAL_BASED_OUTPATIENT_CLINIC_OR_DEPARTMENT_OTHER): Payer: BC Managed Care – PPO | Admitting: Oncology

## 2014-11-04 ENCOUNTER — Encounter: Payer: Self-pay | Admitting: *Deleted

## 2014-11-04 ENCOUNTER — Encounter: Payer: Self-pay | Admitting: Oncology

## 2014-11-04 ENCOUNTER — Other Ambulatory Visit (HOSPITAL_BASED_OUTPATIENT_CLINIC_OR_DEPARTMENT_OTHER): Payer: BC Managed Care – PPO

## 2014-11-04 VITALS — BP 116/73 | HR 89 | Temp 98.3°F | Resp 18 | Ht 62.0 in | Wt 120.7 lb

## 2014-11-04 DIAGNOSIS — C50811 Malignant neoplasm of overlapping sites of right female breast: Secondary | ICD-10-CM

## 2014-11-04 DIAGNOSIS — Z5189 Encounter for other specified aftercare: Secondary | ICD-10-CM

## 2014-11-04 DIAGNOSIS — C50211 Malignant neoplasm of upper-inner quadrant of right female breast: Secondary | ICD-10-CM

## 2014-11-04 DIAGNOSIS — Z5111 Encounter for antineoplastic chemotherapy: Secondary | ICD-10-CM

## 2014-11-04 DIAGNOSIS — Z5112 Encounter for antineoplastic immunotherapy: Secondary | ICD-10-CM | POA: Diagnosis not present

## 2014-11-04 LAB — COMPREHENSIVE METABOLIC PANEL (CC13)
ALBUMIN: 3.3 g/dL — AB (ref 3.5–5.0)
ALK PHOS: 65 U/L (ref 40–150)
ALT: 16 U/L (ref 0–55)
AST: 20 U/L (ref 5–34)
Anion Gap: 10 mEq/L (ref 3–11)
BUN: 10.4 mg/dL (ref 7.0–26.0)
CHLORIDE: 102 meq/L (ref 98–109)
CO2: 27 mEq/L (ref 22–29)
Calcium: 9.1 mg/dL (ref 8.4–10.4)
Creatinine: 0.8 mg/dL (ref 0.6–1.1)
EGFR: 81 mL/min/{1.73_m2} — AB (ref >90)
Glucose: 138 mg/dl (ref 70–140)
POTASSIUM: 3.7 meq/L (ref 3.5–5.1)
Sodium: 140 mEq/L (ref 136–145)
Total Bilirubin: 0.4 mg/dL (ref 0.20–1.20)
Total Protein: 6.1 g/dL — ABNORMAL LOW (ref 6.4–8.3)

## 2014-11-04 LAB — CBC WITH DIFFERENTIAL/PLATELET
BASO%: 0 % (ref 0.0–2.0)
Basophils Absolute: 0 10*3/uL (ref 0.0–0.1)
EOS%: 0 % (ref 0.0–7.0)
Eosinophils Absolute: 0 10*3/uL (ref 0.0–0.5)
HEMATOCRIT: 26.9 % — AB (ref 34.8–46.6)
HGB: 9.2 g/dL — ABNORMAL LOW (ref 11.6–15.9)
LYMPH#: 1 10*3/uL (ref 0.9–3.3)
LYMPH%: 20 % (ref 14.0–49.7)
MCH: 33.3 pg (ref 25.1–34.0)
MCHC: 34.2 g/dL (ref 31.5–36.0)
MCV: 97.5 fL (ref 79.5–101.0)
MONO#: 0.5 10*3/uL (ref 0.1–0.9)
MONO%: 9.7 % (ref 0.0–14.0)
NEUT#: 3.6 10*3/uL (ref 1.5–6.5)
NEUT%: 70.3 % (ref 38.4–76.8)
Platelets: 194 10*3/uL (ref 145–400)
RBC: 2.76 10*6/uL — AB (ref 3.70–5.45)
RDW: 13.9 % (ref 11.2–14.5)
WBC: 5.1 10*3/uL (ref 3.9–10.3)

## 2014-11-04 MED ORDER — DIPHENHYDRAMINE HCL 25 MG PO CAPS
25.0000 mg | ORAL_CAPSULE | Freq: Once | ORAL | Status: AC
  Administered 2014-11-04: 25 mg via ORAL

## 2014-11-04 MED ORDER — SODIUM CHLORIDE 0.9 % IJ SOLN
10.0000 mL | INTRAMUSCULAR | Status: DC | PRN
  Administered 2014-11-04: 10 mL
  Filled 2014-11-04: qty 10

## 2014-11-04 MED ORDER — ACETAMINOPHEN 325 MG PO TABS
650.0000 mg | ORAL_TABLET | Freq: Once | ORAL | Status: AC
  Administered 2014-11-04: 650 mg via ORAL

## 2014-11-04 MED ORDER — HEPARIN SOD (PORK) LOCK FLUSH 100 UNIT/ML IV SOLN
500.0000 [IU] | Freq: Once | INTRAVENOUS | Status: AC | PRN
  Administered 2014-11-04: 500 [IU]
  Filled 2014-11-04: qty 5

## 2014-11-04 MED ORDER — PERTUZUMAB CHEMO INJECTION 420 MG/14ML
420.0000 mg | Freq: Once | INTRAVENOUS | Status: AC
  Administered 2014-11-04: 420 mg via INTRAVENOUS
  Filled 2014-11-04: qty 14

## 2014-11-04 MED ORDER — SODIUM CHLORIDE 0.9 % IV SOLN
6.0000 mg/kg | Freq: Once | INTRAVENOUS | Status: AC
  Administered 2014-11-04: 336 mg via INTRAVENOUS
  Filled 2014-11-04: qty 16

## 2014-11-04 MED ORDER — DOCETAXEL CHEMO INJECTION 160 MG/16ML
75.0000 mg/m2 | Freq: Once | INTRAVENOUS | Status: AC
  Administered 2014-11-04: 120 mg via INTRAVENOUS
  Filled 2014-11-04: qty 12

## 2014-11-04 MED ORDER — DIPHENHYDRAMINE HCL 25 MG PO CAPS
ORAL_CAPSULE | ORAL | Status: AC
  Filled 2014-11-04: qty 1

## 2014-11-04 MED ORDER — SODIUM CHLORIDE 0.9 % IV SOLN
Freq: Once | INTRAVENOUS | Status: AC
  Administered 2014-11-04: 09:00:00 via INTRAVENOUS

## 2014-11-04 MED ORDER — ACETAMINOPHEN 325 MG PO TABS
ORAL_TABLET | ORAL | Status: AC
  Filled 2014-11-04: qty 2

## 2014-11-04 MED ORDER — PEGFILGRASTIM 6 MG/0.6ML ~~LOC~~ PSKT
6.0000 mg | PREFILLED_SYRINGE | Freq: Once | SUBCUTANEOUS | Status: AC
  Administered 2014-11-04: 6 mg via SUBCUTANEOUS
  Filled 2014-11-04: qty 0.6

## 2014-11-04 MED ORDER — SODIUM CHLORIDE 0.9 % IV SOLN
Freq: Once | INTRAVENOUS | Status: AC
  Administered 2014-11-04: 11:00:00 via INTRAVENOUS
  Filled 2014-11-04: qty 8

## 2014-11-04 MED ORDER — SODIUM CHLORIDE 0.9 % IV SOLN
461.5000 mg | Freq: Once | INTRAVENOUS | Status: AC
  Administered 2014-11-04: 460 mg via INTRAVENOUS
  Filled 2014-11-04: qty 46

## 2014-11-04 NOTE — Progress Notes (Unsigned)
Met with pt during chemo. Relate she is doing well and denies complaints at this time. Encourage pt to call with questions or concerns. Received verbal understanding.

## 2014-11-04 NOTE — Patient Instructions (Signed)
[facility] Discharge Instructions for Patients Receiving Chemotherapy  Today you received the following chemotherapy agents:  Herceptin, Perjeta, Taxotere and Carboplatin  To help prevent nausea and vomiting after your treatment, we encourage you to take your nausea medication as ordered per MD.   If you develop nausea and vomiting that is not controlled by your nausea medication, call the clinic.   BELOW ARE SYMPTOMS THAT SHOULD BE REPORTED IMMEDIATELY:  *FEVER GREATER THAN 100.5 F  *CHILLS WITH OR WITHOUT FEVER  NAUSEA AND VOMITING THAT IS NOT CONTROLLED WITH YOUR NAUSEA MEDICATION  *UNUSUAL SHORTNESS OF BREATH  *UNUSUAL BRUISING OR BLEEDING  TENDERNESS IN MOUTH AND THROAT WITH OR WITHOUT PRESENCE OF ULCERS  *URINARY PROBLEMS  *BOWEL PROBLEMS  UNUSUAL RASH Items with * indicate a potential emergency and should be followed up as soon as possible.  Feel free to call the clinic you have any questions or concerns. The clinic phone number is [phone].  Please show the [facility] at check-in to the Emergency Department and triage nurse.

## 2014-11-04 NOTE — Telephone Encounter (Signed)
per pof to sch pt appt-sent MW email to sch pt trmt-will mail pt updated copy afrer reply

## 2014-11-04 NOTE — Progress Notes (Signed)
[place]  Telephone:(336) [phone] Fax:(336) [phone]     ID: Beth Rice DOB: [birth year]  MR#: [id number]  LFY#:[id number]  Patient Care Team: Darlyne Russian, MD as PCP - General (Family Medicine) [name], MD as Consulting Physician (General Surgery) Chauncey Cruel, MD as Consulting Physician (Oncology) Eppie Gibson, MD as Attending Physician (Radiation Oncology) Rockwell Germany, RN as Registered Nurse Mauro Kaufmann, RN as Registered Nurse PCP: [name], MD OTHER MD: Jamie Kato MD  CHIEF COMPLAINT: HER-2 positive breast cancer  CURRENT TREATMENT: Neoadjuvant chemotherapy and anti-HER-2 immunotherapy   BREAST CANCER HISTORY: From the original intake note:  Beth Rice herself palpated a mass in her right breast but "didn't think much about it". She saw [name] for routine gynecologic follow-up and he also palpated and immediately set her up for right diagnostic mammography with tomosynthesis and ultrasonography at the [facility] 08/27/2014. In the upper outer quadrant of the right breast there was a spiculated mass accompanied by calcifications, the complex extending up to 2.5 cm. This was palpable and mobile. It measured approximately 2 cm by palpation. There was no palpable right axillary adenopathy. Ultrasound of the right axilla also showed normal axillary contents.  Ultrasound of the right breast confirmed an irregularly marginated hypoechoic mass measuring 1.8 cm. Biopsy was not immediately performed because of patient was on aspirin at the time. On 08/30/2014 Beth Rice underwent biopsy of the mass in question, with the pathology ([id number]) (showing an invasive ductal carcinoma, grade 2, estrogen receptor 2% positive with moderate staining intensity, progesterone receptor negative, with an MIB-1 of 81% and with HER-2 amplified, the signals ratio being 2.87 and the number per cell 4.30.  On 09/03/2024 she underwent bilateral breast MRI. This  found the breast composition to be category C. In the right breast there was a spiculated mass measuring 2.3 cm in close proximity to the pectoralis but without obvious invasion of the muscle. The rest of the breast, the left breast, and the lymph node areas well otherwise unremarkable.  Her subsequent history is as detailed below   INTERVAL HISTORY: Beth Rice returns for follow up of her breast cancer, accompanied by a friend. Today is day 1, cycle 3 of 6 planned cycles of of carboplatin, docetaxel, trastuzumab and pertuzumab given every 3 weeks with Onpro support.  REVIEW OF SYSTEMS: Patient states she been doing well over the past week. She has had some mild fatigue, but is able to perform ADLs and do some light housework. She has requested information regarding cleaning services that might be available to her while she is on chemotherapy. She denies chest pain, shortness of breath, abdominal pain, nausea, and vomiting. Her constipation is improved with the use of Senokot as needed. Denies neuropathy. A detailed review of systems is stable. She is ready to proceed with cycle 3 of her chemotherapy today.  PAST MEDICAL HISTORY: Past Medical History  Diagnosis Date  . Anxiety   . Elevated cholesterol   . Depression   . Chest pain     a. 09/2008 Cath: ER 60%, LM nl, LAD 51m 467m, LCX nondom, nl, RCA dom, 2038mDA/PLA nl.  . Tobacco abuse   . H/O degenerative disc disease   . Myocardial infarction     "I think I had a heart attack in 2010"  . Headache     "frequency depends on the weather" (06/04/2014)  . Arthritis     "neck" (06/04/2014)  . Chronic lower back pain   .  Gastroesophageal reflux disease     H/O  . Hot flashes     PAST SURGICAL HISTORY: Past Surgical History  Procedure Laterality Date  . Ovarian cyst removal  1980's?  . Tonsillectomy    . Cardiac catheterization  10/15/2008    noncritical mid & distal third anterior descending disease  . Portacath placement Left 09/17/2014      Procedure: INSERTION PORT-A-CATH;  Surgeon: [name], MD;  Location: [facility];  Service: General;  Laterality: Left;    FAMILY HISTORY Family History  Problem Relation Age of Onset  . Hypertension Sister   . Heart disease Mother   . Alzheimer's disease Mother   . Cerebral aneurysm Father   . Mental illness Father   . Cancer Maternal Aunt 45    ovarian  . Cancer Other 31    mat great aunt (through [facility]) with colorectal cancer  . Cancer Other 38    mat great uncle (through [facility]) with prostate cancer  . Cancer Cousin 19    paternal female first cousin through an uncle with pancreatic cancer  The patient's father died at the age of 62 after having cerebral hemorrhages and age 62 and 26. The patient's mother died at the age of 4 with Parkinson's and Alzheimer's disease. The patient's mother had one sister and this sister was diagnosed with ovarian cancer in her early 32s. There is no other history of breast or ovarian cancer in the family.   GYNECOLOGIC HISTORY:  No LMP recorded. Patient is postmenopausal.  menarche age 13, the patient is GX P0. She stopped having periods in 2005. She did not take hormone replacement.   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 She is in process of retiring, with targeted retirement date in June. She is single, lives alone with 4 dogs.    ADVANCED DIRECTIVES: Not in place. She tells me she plans to name her sister Beth Rice as her healthcare power of attorney. Beth Rice (who is a Marine scientist) can be reached in [place] at [phone] (cell) or [phone] (home).   HEALTH MAINTENANCE: History  Substance Use Topics  . Smoking status: Former Smoker -- 1.00 packs/day for 20 years    Types: Cigarettes    Quit date: 06/21/2014  . Smokeless tobacco: Never Used  . Alcohol Use: 1.8 oz/week    3 Cans of beer, 0 Standard drinks or equivalent per week      Colonoscopy: 2006/[facility]  DPO:EUMPN 2016   Bone density:09/10/[facility]/normal   Lipid panel:  Allergies  Allergen Reactions  . Penicillins Hives  . Statins     Patient has significant muscle cramps with statins. She cannot tolerate them.  . Sulfa Antibiotics Hives    Current Outpatient Prescriptions  Medication Sig Dispense Refill  . acyclovir (ZOVIRAX) 400 MG tablet Take 1 tablet (400 mg total) by mouth 2 (two) times daily. 60 tablet 3  . ALPRAZolam (XANAX) 0.5 MG tablet TAKE 1 TABLET BY MOUTH EVERY 6 HOURS AS NEEDED FOR MUSCLE SPASMS 30 tablet 2  . aspirin EC 81 MG tablet Take 81 mg by mouth daily.    Marland Kitchen b complex vitamins capsule Take 1 capsule by mouth at bedtime.     . Calcium Carbonate-Vitamin D (CALCIUM + D PO) Take 1 tablet by mouth at bedtime.     . cholestyramine (QUESTRAN) 4 G packet Take 1 packet (4 g total) by mouth 2 (two)  times daily. 60 each 1  . clonazePAM (KLONOPIN) 1 MG tablet TAKE 1 TABLET BY MOUTH AT BEDTIME AS NEEDED FOR ANXIETY 90 tablet 0  . dexamethasone (DECADRON) 4 MG tablet Take 2 tablets (8 mg total) by mouth 2 (two) times daily. Start the day before Taxotere. Then again the day after chemo for 3 days. 30 tablet 1  . ezetimibe (ZETIA) 10 MG tablet Take 1 tablet (10 mg total) by mouth daily. 30 tablet 11  . fish oil-omega-3 fatty acids 1000 MG capsule Take 1 g by mouth at bedtime.     . fluconazole (DIFLUCAN) 100 MG tablet Take 1 tablet (100 mg total) by mouth daily. 10 tablet 3  . fluticasone (FLONASE) 50 MCG/ACT nasal spray Place 2 sprays into both nostrils daily. 16 g 11  . levocetirizine (XYZAL) 5 MG tablet TAKE 1 TABLET (5 MG TOTAL) BY MOUTH EVERY EVENING. 30 tablet 3  . lidocaine-prilocaine (EMLA) cream Apply to affected area once 30 g 3  . LORazepam (ATIVAN) 0.5 MG tablet Take 1 tablet (0.5 mg total) by mouth every 6 (six) hours as needed (Nausea or vomiting). 30 tablet 0  . Lysine 500 MG CAPS Take 500 mg by mouth at  bedtime.     . Multiple Vitamins-Minerals (MULTIVITAMIN WITH MINERALS) tablet Take 1 tablet by mouth at bedtime.     Marland Kitchen omeprazole (PRILOSEC) 40 MG capsule Take 1 capsule (40 mg total) by mouth daily. 30 capsule 2  . ondansetron (ZOFRAN) 8 MG tablet Take 1 tablet (8 mg total) by mouth 2 (two) times daily. Start the day after chemo for 3 days. Then take as needed for nausea or vomiting. 30 tablet 1  . oxyCODONE-acetaminophen (ROXICET) 5-325 MG per tablet Take 1-2 tablets by mouth every 4 (four) hours as needed for severe pain. 30 tablet 0  . prochlorperazine (COMPAZINE) 10 MG tablet Take 1 tablet (10 mg total) by mouth every 6 (six) hours as needed (Nausea or vomiting). 30 tablet 1  . ranitidine (ZANTAC) 150 MG tablet TAKE 1 TABLET (150 MG TOTAL) BY MOUTH 2 (TWO) TIMES DAILY. 60 tablet 3  . Salicylic Acid 3 % SHAM Apply 1 application topically daily. 177 mL 0  . sertraline (ZOLOFT) 50 MG tablet Take 1 tablet (50 mg total) by mouth daily. 30 tablet 11   No current facility-administered medications for this visit.   Facility-Administered Medications Ordered in Other Visits  Medication Dose Route Frequency Provider Last Rate Last Dose  . sodium chloride 0.9 % injection 10 mL  10 mL Intracatheter PRN Chauncey Cruel, MD   10 mL at 11/04/14 1307    OBJECTIVE: middle-aged white woman who appears stated age 38 Vitals:   11/04/14 0835  BP: 116/73  Pulse: 89  Temp: 98.3 F (36.8 C)  Resp: 18     Body mass index is 22.07 kg/(m^2).    ECOG FS:1 - Symptomatic but completely ambulatory  Skin: warm, dry  HEENT: sclerae anicteric, conjunctivae pink, oropharynx clear. No thrush or mucositis.  Lymph Nodes: No cervical or supraclavicular lymphadenopathy  Lungs: clear to auscultation bilaterally, no rales, wheezes, or rhonci  Heart: regular rate and rhythm  Abdomen: round, soft, non tender, positive bowel sounds  Musculoskeletal: No focal spinal tenderness, no peripheral edema  Neuro: non focal,  well oriented, positive affect  Breasts: deferred    LAB RESULTS:  CMP     Component Value Date/Time   NA 140 11/04/2014 0816   NA 139 06/18/2014 1133  K 3.7 11/04/2014 0816   K 4.2 06/18/2014 1133   CL 103 06/18/2014 1133   CO2 27 11/04/2014 0816   CO2 27 06/18/2014 1133   GLUCOSE 138 11/04/2014 0816   GLUCOSE 93 06/18/2014 1133   BUN 10.4 11/04/2014 0816   BUN 7 06/18/2014 1133   CREATININE 0.8 11/04/2014 0816   CREATININE 0.73 06/18/2014 1133   CREATININE 0.64 06/03/2014 1203   CALCIUM 9.1 11/04/2014 0816   CALCIUM 9.3 06/18/2014 1133   PROT 6.1* 11/04/2014 0816   PROT 6.8 10/23/2013 1030   ALBUMIN 3.3* 11/04/2014 0816   ALBUMIN 4.5 10/23/2013 1030   AST 20 11/04/2014 0816   AST 25 10/23/2013 1030   ALT 16 11/04/2014 0816   ALT 16 10/23/2013 1030   ALKPHOS 65 11/04/2014 0816   ALKPHOS 68 10/23/2013 1030   BILITOT 0.40 11/04/2014 0816   BILITOT 0.7 10/23/2013 1030   GFRNONAA >90 06/03/2014 1203   GFRNONAA >89 10/23/2013 1030   GFRAA >90 06/03/2014 1203   GFRAA >89 10/23/2013 1030    INo results found for: SPEP, UPEP  Lab Results  Component Value Date   WBC 5.1 11/04/2014   NEUTROABS 3.6 11/04/2014   HGB 9.2* 11/04/2014   HCT 26.9* 11/04/2014   MCV 97.5 11/04/2014   PLT 194 11/04/2014      Chemistry      Component Value Date/Time   NA 140 11/04/2014 0816   NA 139 06/18/2014 1133   K 3.7 11/04/2014 0816   K 4.2 06/18/2014 1133   CL 103 06/18/2014 1133   CO2 27 11/04/2014 0816   CO2 27 06/18/2014 1133   BUN 10.4 11/04/2014 0816   BUN 7 06/18/2014 1133   CREATININE 0.8 11/04/2014 0816   CREATININE 0.73 06/18/2014 1133   CREATININE 0.64 06/03/2014 1203      Component Value Date/Time   CALCIUM 9.1 11/04/2014 0816   CALCIUM 9.3 06/18/2014 1133   ALKPHOS 65 11/04/2014 0816   ALKPHOS 68 10/23/2013 1030   AST 20 11/04/2014 0816   AST 25 10/23/2013 1030   ALT 16 11/04/2014 0816   ALT 16 10/23/2013 1030   BILITOT 0.40 11/04/2014 0816   BILITOT  0.7 10/23/2013 1030       No results found for: LABCA2  No components found for: LABCA125  No results for input(s): INR in the last 168 hours.  Urinalysis    Component Value Date/Time   COLORURINE CANCELED 08/21/2014 1204   APPEARANCEUR CANCELED 08/21/2014 1204   LABSPEC CANCELED 08/21/2014 1204   PHURINE CANCELED 08/21/2014 1204   GLUCOSEU CANCELED 08/21/2014 1204   HGBUR CANCELED 08/21/2014 1204   BILIRUBINUR CANCELED 08/21/2014 1204   BILIRUBINUR neg 06/18/2014 1103   KETONESUR CANCELED 08/21/2014 1204   PROTEINUR CANCELED 08/21/2014 1204   PROTEINUR neg 06/18/2014 1103   UROBILINOGEN CANCELED 08/21/2014 1204   UROBILINOGEN 0.2 06/18/2014 1103   NITRITE CANCELED 08/21/2014 1204   NITRITE neg 06/18/2014 1103   LEUKOCYTESUR CANCELED 08/21/2014 1204    STUDIES: No results found.  ASSESSMENT: 59 y.o. [place] woman status post right breast biopsy 08/30/2014 for a clinical T2 N0, stage IIA invasive ductal carcinoma, grade 2, estrogen receptor2% "positive," progesterone receptor negative, HER-2 positive, with an MIB-1 of 81%.  (1) neoadjuvant chemotherapy will consist of carboplatin, docetaxel, trastuzumab and pertuzumab beginning 09/23/14  (2) trastuzumab will be continued to complete 1 year  (3) surgery will follow chemotherapy  (4) adjuvant radiation will follow surgery  (5) consider anti-estrogens after radiation  (6)  genetics testing pending  PLAN: Jennilyn will proceed with cycle 3 day 1 of her chemotherapy today. We discussed using her anti-emetics following chemotherapy. I have sent a message to social work to recheck to the patient regarding cleaning services while she is on chemotherapy.  Philippa will return in 1 week for labs and her next cycle of treatment. She understands and agrees with this plan. She knows the goal of treatment in her case is cure. She has been encouraged to call with any issues that might arise before her next visit here.     Mikey Bussing, NP   11/04/2014 3:55 PM

## 2014-11-05 ENCOUNTER — Telehealth: Payer: Self-pay | Admitting: *Deleted

## 2014-11-05 ENCOUNTER — Telehealth: Payer: Self-pay | Admitting: Nurse Practitioner

## 2014-11-05 ENCOUNTER — Telehealth: Payer: Self-pay | Admitting: Oncology

## 2014-11-05 ENCOUNTER — Encounter: Payer: Self-pay | Admitting: *Deleted

## 2014-11-05 NOTE — Telephone Encounter (Signed)
I have returned patient's call to reschedule her appt on 6/9. I left a message with the times available. Left message for her to call back

## 2014-11-05 NOTE — Progress Notes (Signed)
Pt called stating she wasn't scheduled for IVF post chemotherapy treatment. POF entered for pt to receive IVF on 11/07/14. Pt aware she will receive a call for schedule.

## 2014-11-05 NOTE — Telephone Encounter (Signed)
Per staff message and POF I have scheduled appts. Advised scheduler of appts. JMW  

## 2014-11-05 NOTE — Telephone Encounter (Signed)
Left message to confirm appointment for 06/09. °

## 2014-11-05 NOTE — Telephone Encounter (Signed)
Patent called back and took 9am appt

## 2014-11-05 NOTE — Telephone Encounter (Signed)
per pof to sch pt appts-cld & spoke to pt to adv of time & date of appt-pt understoo

## 2014-11-06 ENCOUNTER — Ambulatory Visit: Payer: BC Managed Care – PPO

## 2014-11-07 ENCOUNTER — Ambulatory Visit (HOSPITAL_BASED_OUTPATIENT_CLINIC_OR_DEPARTMENT_OTHER): Payer: BC Managed Care – PPO

## 2014-11-07 ENCOUNTER — Telehealth: Payer: Self-pay | Admitting: Nurse Practitioner

## 2014-11-07 ENCOUNTER — Other Ambulatory Visit: Payer: Self-pay

## 2014-11-07 VITALS — BP 114/62 | HR 72 | Temp 98.3°F | Resp 18

## 2014-11-07 DIAGNOSIS — C50811 Malignant neoplasm of overlapping sites of right female breast: Secondary | ICD-10-CM | POA: Diagnosis not present

## 2014-11-07 DIAGNOSIS — Z5189 Encounter for other specified aftercare: Secondary | ICD-10-CM

## 2014-11-07 DIAGNOSIS — C50211 Malignant neoplasm of upper-inner quadrant of right female breast: Secondary | ICD-10-CM

## 2014-11-07 MED ORDER — SODIUM CHLORIDE 0.9 % IV SOLN
Freq: Once | INTRAVENOUS | Status: AC
  Administered 2014-11-07: 10:00:00 via INTRAVENOUS
  Filled 2014-11-07: qty 4

## 2014-11-07 MED ORDER — HEPARIN SOD (PORK) LOCK FLUSH 100 UNIT/ML IV SOLN
500.0000 [IU] | Freq: Once | INTRAVENOUS | Status: AC | PRN
  Administered 2014-11-07: 500 [IU]
  Filled 2014-11-07: qty 5

## 2014-11-07 MED ORDER — SODIUM CHLORIDE 0.9 % IJ SOLN
10.0000 mL | INTRAMUSCULAR | Status: DC | PRN
  Administered 2014-11-07: 10 mL
  Filled 2014-11-07: qty 10

## 2014-11-07 MED ORDER — SODIUM CHLORIDE 0.9 % IV SOLN
Freq: Once | INTRAVENOUS | Status: AC
  Administered 2014-11-07: 10:00:00 via INTRAVENOUS

## 2014-11-07 NOTE — Telephone Encounter (Signed)
Appointments made and avs printed for patient °

## 2014-11-07 NOTE — Patient Instructions (Signed)
Dehydration, Adult Dehydration is when you lose more fluids from the body than you take in. Vital organs like the kidneys, brain, and heart cannot function without a proper amount of fluids and salt. Any loss of fluids from the body can cause dehydration.  CAUSES   Vomiting.  Diarrhea.  Excessive sweating.  Excessive urine output.  Fever. SYMPTOMS  Mild dehydration  Thirst.  Dry lips.  Slightly dry mouth. Moderate dehydration  Very dry mouth.  Sunken eyes.  Skin does not bounce back quickly when lightly pinched and released.  Dark urine and decreased urine production.  Decreased tear production.  Headache. Severe dehydration  Very dry mouth.  Extreme thirst.  Rapid, weak pulse (more than 100 beats per minute at rest).  Cold hands and feet.  Not able to sweat in spite of heat and temperature.  Rapid breathing.  Blue lips.  Confusion and lethargy.  Difficulty being awakened.  Minimal urine production.  No tears. DIAGNOSIS  Your caregiver will diagnose dehydration based on your symptoms and your exam. Blood and urine tests will help confirm the diagnosis. The diagnostic evaluation should also identify the cause of dehydration. TREATMENT  Treatment of mild or moderate dehydration can often be done at home by increasing the amount of fluids that you drink. It is best to drink small amounts of fluid more often. Drinking too much at one time can make vomiting worse. Refer to the home care instructions below. Severe dehydration needs to be treated at the hospital where you will probably be given intravenous (IV) fluids that contain water and electrolytes. HOME CARE INSTRUCTIONS   Ask your caregiver about specific rehydration instructions.  Drink enough fluids to keep your urine clear or pale yellow.  Drink small amounts frequently if you have nausea and vomiting.  Eat as you normally do.  Avoid:  Foods or drinks high in sugar.  Carbonated  drinks.  Juice.  Extremely hot or cold fluids.  Drinks with caffeine.  Fatty, greasy foods.  Alcohol.  Tobacco.  Overeating.  Gelatin desserts.  Wash your hands well to avoid spreading bacteria and viruses.  Only take over-the-counter or prescription medicines for pain, discomfort, or fever as directed by your caregiver.  Ask your caregiver if you should continue all prescribed and over-the-counter medicines.  Keep all follow-up appointments with your caregiver. SEEK MEDICAL CARE IF:  You have abdominal pain and it increases or stays in one area (localizes).  You have a rash, stiff neck, or severe headache.  You are irritable, sleepy, or difficult to awaken.  You are weak, dizzy, or extremely thirsty. SEEK IMMEDIATE MEDICAL CARE IF:   You are unable to keep fluids down or you get worse despite treatment.  You have frequent episodes of vomiting or diarrhea.  You have blood or green matter (bile) in your vomit.  You have blood in your stool or your stool looks black and tarry.  You have not urinated in 6 to 8 hours, or you have only urinated a small amount of very dark urine.  You have a fever.  You faint. MAKE SURE YOU:   Understand these instructions.  Will watch your condition.  Will get help right away if you are not doing well or get worse. Document Released: 05/17/2005 Document Revised: 08/09/2011 Document Reviewed: 01/04/2011 ExitCare Patient Information 2015 [employer]. This information is not intended to replace advice given to you by your health care provider. Make sure you discuss any questions you have with your health care   provider.  

## 2014-11-08 ENCOUNTER — Ambulatory Visit (HOSPITAL_BASED_OUTPATIENT_CLINIC_OR_DEPARTMENT_OTHER): Payer: BC Managed Care – PPO

## 2014-11-08 VITALS — BP 117/66 | HR 73 | Temp 98.8°F | Resp 16

## 2014-11-08 DIAGNOSIS — C50811 Malignant neoplasm of overlapping sites of right female breast: Secondary | ICD-10-CM | POA: Diagnosis not present

## 2014-11-08 DIAGNOSIS — Z5189 Encounter for other specified aftercare: Secondary | ICD-10-CM | POA: Diagnosis not present

## 2014-11-08 DIAGNOSIS — C50211 Malignant neoplasm of upper-inner quadrant of right female breast: Secondary | ICD-10-CM

## 2014-11-08 MED ORDER — SODIUM CHLORIDE 0.9 % IJ SOLN
10.0000 mL | INTRAMUSCULAR | Status: DC | PRN
  Administered 2014-11-08: 10 mL
  Filled 2014-11-08: qty 10

## 2014-11-08 MED ORDER — HEPARIN SOD (PORK) LOCK FLUSH 100 UNIT/ML IV SOLN
500.0000 [IU] | Freq: Once | INTRAVENOUS | Status: AC | PRN
  Administered 2014-11-08: 500 [IU]
  Filled 2014-11-08: qty 5

## 2014-11-08 MED ORDER — SODIUM CHLORIDE 0.9 % IV SOLN
INTRAVENOUS | Status: DC
  Administered 2014-11-08: 12:00:00 via INTRAVENOUS

## 2014-11-08 NOTE — Patient Instructions (Signed)
Dehydration, Adult Dehydration is when you lose more fluids from the body than you take in. Vital organs like the kidneys, brain, and heart cannot function without a proper amount of fluids and salt. Any loss of fluids from the body can cause dehydration.  CAUSES   Vomiting.  Diarrhea.  Excessive sweating.  Excessive urine output.  Fever. SYMPTOMS  Mild dehydration  Thirst.  Dry lips.  Slightly dry mouth. Moderate dehydration  Very dry mouth.  Sunken eyes.  Skin does not bounce back quickly when lightly pinched and released.  Dark urine and decreased urine production.  Decreased tear production.  Headache. Severe dehydration  Very dry mouth.  Extreme thirst.  Rapid, weak pulse (more than 100 beats per minute at rest).  Cold hands and feet.  Not able to sweat in spite of heat and temperature.  Rapid breathing.  Blue lips.  Confusion and lethargy.  Difficulty being awakened.  Minimal urine production.  No tears. DIAGNOSIS  Your caregiver will diagnose dehydration based on your symptoms and your exam. Blood and urine tests will help confirm the diagnosis. The diagnostic evaluation should also identify the cause of dehydration. TREATMENT  Treatment of mild or moderate dehydration can often be done at home by increasing the amount of fluids that you drink. It is best to drink small amounts of fluid more often. Drinking too much at one time can make vomiting worse. Refer to the home care instructions below. Severe dehydration needs to be treated at the hospital where you will probably be given intravenous (IV) fluids that contain water and electrolytes. HOME CARE INSTRUCTIONS   Ask your caregiver about specific rehydration instructions.  Drink enough fluids to keep your urine clear or pale yellow.  Drink small amounts frequently if you have nausea and vomiting.  Eat as you normally do.  Avoid:  Foods or drinks high in sugar.  Carbonated  drinks.  Juice.  Extremely hot or cold fluids.  Drinks with caffeine.  Fatty, greasy foods.  Alcohol.  Tobacco.  Overeating.  Gelatin desserts.  Wash your hands well to avoid spreading bacteria and viruses.  Only take over-the-counter or prescription medicines for pain, discomfort, or fever as directed by your caregiver.  Ask your caregiver if you should continue all prescribed and over-the-counter medicines.  Keep all follow-up appointments with your caregiver. SEEK MEDICAL CARE IF:  You have abdominal pain and it increases or stays in one area (localizes).  You have a rash, stiff neck, or severe headache.  You are irritable, sleepy, or difficult to awaken.  You are weak, dizzy, or extremely thirsty. SEEK IMMEDIATE MEDICAL CARE IF:   You are unable to keep fluids down or you get worse despite treatment.  You have frequent episodes of vomiting or diarrhea.  You have blood or green matter (bile) in your vomit.  You have blood in your stool or your stool looks black and tarry.  You have not urinated in 6 to 8 hours, or you have only urinated a small amount of very dark urine.  You have a fever.  You faint. MAKE SURE YOU:   Understand these instructions.  Will watch your condition.  Will get help right away if you are not doing well or get worse. Document Released: 05/17/2005 Document Revised: 08/09/2011 Document Reviewed: 01/04/2011 ExitCare Patient Information 2015 [employer]. This information is not intended to replace advice given to you by your health care provider. Make sure you discuss any questions you have with your health care   provider.  

## 2014-11-11 ENCOUNTER — Ambulatory Visit (HOSPITAL_BASED_OUTPATIENT_CLINIC_OR_DEPARTMENT_OTHER): Payer: BC Managed Care – PPO | Admitting: Nurse Practitioner

## 2014-11-11 ENCOUNTER — Encounter: Payer: Self-pay | Admitting: Nurse Practitioner

## 2014-11-11 ENCOUNTER — Other Ambulatory Visit (HOSPITAL_BASED_OUTPATIENT_CLINIC_OR_DEPARTMENT_OTHER): Payer: BC Managed Care – PPO

## 2014-11-11 VITALS — BP 127/78 | HR 91 | Temp 98.0°F | Resp 18 | Ht 62.0 in | Wt 116.5 lb

## 2014-11-11 DIAGNOSIS — N898 Other specified noninflammatory disorders of vagina: Secondary | ICD-10-CM

## 2014-11-11 DIAGNOSIS — C50211 Malignant neoplasm of upper-inner quadrant of right female breast: Secondary | ICD-10-CM

## 2014-11-11 DIAGNOSIS — D6481 Anemia due to antineoplastic chemotherapy: Secondary | ICD-10-CM

## 2014-11-11 DIAGNOSIS — C50811 Malignant neoplasm of overlapping sites of right female breast: Secondary | ICD-10-CM

## 2014-11-11 DIAGNOSIS — G620 Drug-induced polyneuropathy: Secondary | ICD-10-CM

## 2014-11-11 DIAGNOSIS — T451X5A Adverse effect of antineoplastic and immunosuppressive drugs, initial encounter: Secondary | ICD-10-CM

## 2014-11-11 LAB — CBC WITH DIFFERENTIAL/PLATELET
BASO%: 0.3 % (ref 0.0–2.0)
Basophils Absolute: 0 10*3/uL (ref 0.0–0.1)
EOS ABS: 0 10*3/uL (ref 0.0–0.5)
EOS%: 0.6 % (ref 0.0–7.0)
HCT: 28 % — ABNORMAL LOW (ref 34.8–46.6)
HGB: 9.5 g/dL — ABNORMAL LOW (ref 11.6–15.9)
LYMPH%: 32.1 % (ref 14.0–49.7)
MCH: 33.3 pg (ref 25.1–34.0)
MCHC: 33.9 g/dL (ref 31.5–36.0)
MCV: 98.2 fL (ref 79.5–101.0)
MONO#: 1.3 10*3/uL — ABNORMAL HIGH (ref 0.1–0.9)
MONO%: 19.3 % — AB (ref 0.0–14.0)
NEUT%: 47.7 % (ref 38.4–76.8)
NEUTROS ABS: 3.2 10*3/uL (ref 1.5–6.5)
PLATELETS: 151 10*3/uL (ref 145–400)
RBC: 2.85 10*6/uL — ABNORMAL LOW (ref 3.70–5.45)
RDW: 14.3 % (ref 11.2–14.5)
WBC: 6.6 10*3/uL (ref 3.9–10.3)
lymph#: 2.1 10*3/uL (ref 0.9–3.3)

## 2014-11-11 LAB — COMPREHENSIVE METABOLIC PANEL (CC13)
ALBUMIN: 3.6 g/dL (ref 3.5–5.0)
ALK PHOS: 79 U/L (ref 40–150)
ALT: 25 U/L (ref 0–55)
AST: 22 U/L (ref 5–34)
Anion Gap: 11 mEq/L (ref 3–11)
BUN: 6.2 mg/dL — ABNORMAL LOW (ref 7.0–26.0)
CHLORIDE: 101 meq/L (ref 98–109)
CO2: 27 meq/L (ref 22–29)
Calcium: 8.7 mg/dL (ref 8.4–10.4)
Creatinine: 0.9 mg/dL (ref 0.6–1.1)
EGFR: 73 mL/min/{1.73_m2} — AB (ref >90)
GLUCOSE: 124 mg/dL (ref 70–140)
Potassium: 3.5 mEq/L (ref 3.5–5.1)
Sodium: 139 mEq/L (ref 136–145)
TOTAL PROTEIN: 6.4 g/dL (ref 6.4–8.3)
Total Bilirubin: 0.28 mg/dL (ref 0.20–1.20)

## 2014-11-11 NOTE — Progress Notes (Signed)
[place]  [phone]) [phone] Fax:([phone]) [phone]     ID: SALIMA RUMER DOB: 02/24/1956  MR#: [id number]  HWK#:[id number]  Patient Care Team: Darlyne Russian, MD as PCP - General (Family Medicine) [name], MD as Consulting Physician (General Surgery) Chauncey Cruel, MD as Consulting Physician (Oncology) Eppie Gibson, MD as Attending Physician (Radiation Oncology) Rockwell Germany, RN as Registered Nurse Mauro Kaufmann, RN as Registered Nurse PCP: [name], MD OTHER MD: Jamie Kato MD  CHIEF COMPLAINT: HER-2 positive breast cancer  CURRENT TREATMENT: Neoadjuvant chemotherapy and anti-HER-2 immunotherapy   BREAST CANCER HISTORY: From the original intake note:  Elenore herself palpated a mass in her right breast but "didn't think much about it". She saw [name] for routine gynecologic follow-up and he also palpated and immediately set her up for right diagnostic mammography with tomosynthesis and ultrasonography at the [facility] 08/27/2014. In the upper outer quadrant of the right breast there was a spiculated mass accompanied by calcifications, the complex extending up to 2.5 cm. This was palpable and mobile. It measured approximately 2 cm by palpation. There was no palpable right axillary adenopathy. Ultrasound of the right axilla also showed normal axillary contents.  Ultrasound of the right breast confirmed an irregularly marginated hypoechoic mass measuring 1.8 cm. Biopsy was not immediately performed because of patient was on aspirin at the time. On 08/30/2014 [name] underwent biopsy of the mass in question, with the pathology ([id number]) (showing an invasive ductal carcinoma, grade 2, estrogen receptor 2% positive with moderate staining intensity, progesterone receptor negative, with an MIB-1 of 81% and with HER-2 amplified, the signals ratio being 2.87 and the number per cell 4.30.  On 09/03/2024 she underwent bilateral breast MRI. This  found the breast composition to be category C. In the right breast there was a spiculated mass measuring 2.3 cm in close proximity to the pectoralis but without obvious invasion of the muscle. The rest of the breast, the left breast, and the lymph node areas well otherwise unremarkable.  Her subsequent history is as detailed below   INTERVAL HISTORY: Alichia returns for follow up of her breast cancer, accompanied by a friend. Today is day 8, cycle 3 of 6 planned cycles of of carboplatin, docetaxel, trastuzumab and pertuzumab given every 3 weeks with Onpro support.  REVIEW OF SYSTEMS: Larya denies fevers, or chills. She has some nausea, and vomited once, but her anti emetics are helpful with this. She is eating and drinking well. She uses imodium for her loose stools. New this week are intense vaginal dryness, neuropathy symptoms to her fingers, and swelling and pain to her left great toe. She believes this may be gout. Her neuropathy feels like sandpaper on her fingertips but it comes and goes. A detailed review of systems is otherwise stable.  PAST MEDICAL HISTORY: Past Medical History  Diagnosis Date  . Anxiety   . Elevated cholesterol   . Depression   . Chest pain     a. 09/2008 Cath: ER 60%, LM nl, LAD 36m 465m, LCX nondom, nl, RCA dom, 2060mDA/PLA nl.  . Tobacco abuse   . H/O degenerative disc disease   . Myocardial infarction     "I think I had a heart attack in 2010"  . Headache     "frequency depends on the weather" (06/04/2014)  . Arthritis     "neck" (06/04/2014)  . Chronic lower back pain   . Gastroesophageal reflux disease  H/O  . Hot flashes     PAST SURGICAL HISTORY: Past Surgical History  Procedure Laterality Date  . Ovarian cyst removal  1980's?  . Tonsillectomy    . Cardiac catheterization  10/15/2008    noncritical mid & distal third anterior descending disease  . Portacath placement Left 09/17/2014    Procedure: INSERTION PORT-A-CATH;  Surgeon: [name],  MD;  Location: [facility];  Service: General;  Laterality: Left;    FAMILY HISTORY Family History  Problem Relation Age of Onset  . Hypertension Sister   . Heart disease Mother   . Alzheimer's disease Mother   . Cerebral aneurysm Father   . Mental illness Father   . Cancer Maternal Aunt 45    ovarian  . Cancer Other 69    mat great aunt (through [facility]) with colorectal cancer  . Cancer Other 60    mat great uncle (through [facility]) with prostate cancer  . Cancer Cousin 27    paternal female first cousin through an uncle with pancreatic cancer  The patient's father died at the age of 85 after having cerebral hemorrhages and age 55 and 55. The patient's mother died at the age of 87 with Parkinson's and Alzheimer's disease. The patient's mother had one sister and this sister was diagnosed with ovarian cancer in her early 43s. There is no other history of breast or ovarian cancer in the family.   GYNECOLOGIC HISTORY:  No LMP recorded. Patient is postmenopausal.  menarche age 59, the patient is GX P0. She stopped having periods in 2005. She did not take hormone replacement.   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 She is in process of retiring, with targeted retirement date in June. She is single, lives alone with 4 dogs.    ADVANCED DIRECTIVES: Not in place. She tells me she plans to name her sister [name] as her healthcare power of attorney. [name] (who is a Marine scientist) can be reached in [place] at [phone] (cell) or [phone] (home).   HEALTH MAINTENANCE: History  Substance Use Topics  . Smoking status: Former Smoker -- 1.00 packs/day for 20 years    Types: Cigarettes    Quit date: 06/21/2014  . Smokeless tobacco: Never Used  . Alcohol Use: 1.8 oz/week    3 Cans of beer, 0 Standard drinks or equivalent per week     Colonoscopy: 2006/[facility]  EQA:[facility] 2016   Bone density:09/10/2008 at  [facility]/normal   Lipid panel:  Allergies  Allergen Reactions  . Penicillins Hives  . Statins     Patient has significant muscle cramps with statins. She cannot tolerate them.  . Sulfa Antibiotics Hives    Current Outpatient Prescriptions  Medication Sig Dispense Refill  . ALPRAZolam (XANAX) 0.5 MG tablet TAKE 1 TABLET BY MOUTH EVERY 6 HOURS AS NEEDED FOR MUSCLE SPASMS 30 tablet 2  . aspirin EC 81 MG tablet Take 81 mg by mouth daily.    Marland Kitchen b complex vitamins capsule Take 1 capsule by mouth at bedtime.     . Calcium Carbonate-Vitamin D (CALCIUM + D PO) Take 1 tablet by mouth at bedtime.     . clonazePAM (KLONOPIN) 1 MG tablet TAKE 1 TABLET BY MOUTH AT BEDTIME AS NEEDED FOR ANXIETY 90 tablet 0  . ezetimibe (ZETIA) 10 MG tablet Take 1 tablet (10 mg total) by mouth daily. 30 tablet 11  . fish oil-omega-3 fatty acids 1000  MG capsule Take 1 g by mouth at bedtime.     . fluticasone (FLONASE) 50 MCG/ACT nasal spray Place 2 sprays into both nostrils daily. 16 g 11  . levocetirizine (XYZAL) 5 MG tablet TAKE 1 TABLET (5 MG TOTAL) BY MOUTH EVERY EVENING. 30 tablet 3  . LORazepam (ATIVAN) 0.5 MG tablet Take 1 tablet (0.5 mg total) by mouth every 6 (six) hours as needed (Nausea or vomiting). 30 tablet 0  . Lysine 500 MG CAPS Take 500 mg by mouth at bedtime.     . Multiple Vitamins-Minerals (MULTIVITAMIN WITH MINERALS) tablet Take 1 tablet by mouth at bedtime.     Marland Kitchen omeprazole (PRILOSEC) 40 MG capsule Take 1 capsule (40 mg total) by mouth daily. 30 capsule 2  . ondansetron (ZOFRAN) 8 MG tablet Take 1 tablet (8 mg total) by mouth 2 (two) times daily. Start the day after chemo for 3 days. Then take as needed for nausea or vomiting. 30 tablet 1  . prochlorperazine (COMPAZINE) 10 MG tablet Take 1 tablet (10 mg total) by mouth every 6 (six) hours as needed (Nausea or vomiting). 30 tablet 1  . ranitidine (ZANTAC) 150 MG tablet TAKE 1 TABLET (150 MG TOTAL) BY MOUTH 2 (TWO) TIMES DAILY.  60 tablet 3  . Salicylic Acid 3 % SHAM Apply 1 application topically daily. 177 mL 0  . sertraline (ZOLOFT) 50 MG tablet Take 1 tablet (50 mg total) by mouth daily. 30 tablet 11  . acyclovir (ZOVIRAX) 400 MG tablet Take 1 tablet (400 mg total) by mouth 2 (two) times daily. (Patient not taking: Reported on 11/11/2014) 60 tablet 3  . cholestyramine (QUESTRAN) 4 G packet Take 1 packet (4 g total) by mouth 2 (two) times daily. (Patient not taking: Reported on 11/11/2014) 60 each 1  . dexamethasone (DECADRON) 4 MG tablet Take 2 tablets (8 mg total) by mouth 2 (two) times daily. Start the day before Taxotere. Then again the day after chemo for 3 days. (Patient not taking: Reported on 11/11/2014) 30 tablet 1  . fluconazole (DIFLUCAN) 100 MG tablet Take 1 tablet (100 mg total) by mouth daily. (Patient not taking: Reported on 11/11/2014) 10 tablet 3  . lidocaine-prilocaine (EMLA) cream Apply to affected area once (Patient not taking: Reported on 11/11/2014) 30 g 3  . oxyCODONE-acetaminophen (ROXICET) 5-325 MG per tablet Take 1-2 tablets by mouth every 4 (four) hours as needed for severe pain. (Patient not taking: Reported on 11/11/2014) 30 tablet 0   No current facility-administered medications for this visit.    OBJECTIVE: middle-aged white woman who appears stated age 29 Vitals:   11/11/14 1200  BP: 127/78  Pulse: 91  Temp: 98 F (36.7 C)  Resp: 18     Body mass index is 21.3 kg/(m^2).    ECOG FS:1 - Symptomatic but completely ambulatory  Sclerae unicteric, pupils round and equal Oropharynx clear and moist-- no thrush or other lesions No cervical or supraclavicular adenopathy Lungs no rales or rhonchi Heart regular rate and rhythm Abd soft, nontender, positive bowel sounds MSK no focal spinal tenderness, left great toe swelling and redness Neuro: nonfocal, well oriented, appropriate affect Breasts: deferred  LAB RESULTS:  CMP     Component Value Date/Time   NA 139 11/11/2014 1138   NA  139 06/18/2014 1133   K 3.5 11/11/2014 1138   K 4.2 06/18/2014 1133   CL 103 06/18/2014 1133   CO2 27 11/11/2014 1138   CO2 27 06/18/2014 1133   GLUCOSE  124 11/11/2014 1138   GLUCOSE 93 06/18/2014 1133   BUN 6.2* 11/11/2014 1138   BUN 7 06/18/2014 1133   CREATININE 0.9 11/11/2014 1138   CREATININE 0.73 06/18/2014 1133   CREATININE 0.64 06/03/2014 1203   CALCIUM 8.7 11/11/2014 1138   CALCIUM 9.3 06/18/2014 1133   PROT 6.4 11/11/2014 1138   PROT 6.8 10/23/2013 1030   ALBUMIN 3.6 11/11/2014 1138   ALBUMIN 4.5 10/23/2013 1030   AST 22 11/11/2014 1138   AST 25 10/23/2013 1030   ALT 25 11/11/2014 1138   ALT 16 10/23/2013 1030   ALKPHOS 79 11/11/2014 1138   ALKPHOS 68 10/23/2013 1030   BILITOT 0.28 11/11/2014 1138   BILITOT 0.7 10/23/2013 1030   GFRNONAA >90 06/03/2014 1203   GFRNONAA >89 10/23/2013 1030   GFRAA >90 06/03/2014 1203   GFRAA >89 10/23/2013 1030    INo results found for: SPEP, UPEP  Lab Results  Component Value Date   WBC 6.6 11/11/2014   NEUTROABS 3.2 11/11/2014   HGB 9.5* 11/11/2014   HCT 28.0* 11/11/2014   MCV 98.2 11/11/2014   PLT 151 11/11/2014      Chemistry      Component Value Date/Time   NA 139 11/11/2014 1138   NA 139 06/18/2014 1133   K 3.5 11/11/2014 1138   K 4.2 06/18/2014 1133   CL 103 06/18/2014 1133   CO2 27 11/11/2014 1138   CO2 27 06/18/2014 1133   BUN 6.2* 11/11/2014 1138   BUN 7 06/18/2014 1133   CREATININE 0.9 11/11/2014 1138   CREATININE 0.73 06/18/2014 1133   CREATININE 0.64 06/03/2014 1203      Component Value Date/Time   CALCIUM 8.7 11/11/2014 1138   CALCIUM 9.3 06/18/2014 1133   ALKPHOS 79 11/11/2014 1138   ALKPHOS 68 10/23/2013 1030   AST 22 11/11/2014 1138   AST 25 10/23/2013 1030   ALT 25 11/11/2014 1138   ALT 16 10/23/2013 1030   BILITOT 0.28 11/11/2014 1138   BILITOT 0.7 10/23/2013 1030       No results found for: LABCA2  No components found for: LABCA125  No results for input(s): INR in the last 168  hours.  Urinalysis    Component Value Date/Time   COLORURINE CANCELED 08/21/2014 1204   APPEARANCEUR CANCELED 08/21/2014 1204   LABSPEC CANCELED 08/21/2014 1204   PHURINE CANCELED 08/21/2014 1204   GLUCOSEU CANCELED 08/21/2014 1204   HGBUR CANCELED 08/21/2014 1204   BILIRUBINUR CANCELED 08/21/2014 1204   BILIRUBINUR neg 06/18/2014 1103   KETONESUR CANCELED 08/21/2014 1204   PROTEINUR CANCELED 08/21/2014 1204   PROTEINUR neg 06/18/2014 1103   UROBILINOGEN CANCELED 08/21/2014 1204   UROBILINOGEN 0.2 06/18/2014 1103   NITRITE CANCELED 08/21/2014 1204   NITRITE neg 06/18/2014 1103   LEUKOCYTESUR CANCELED 08/21/2014 1204    STUDIES: No results found.  ASSESSMENT: 59 y.o.  woman status post right breast biopsy 08/30/2014 for a clinical T2 N0, stage IIA invasive ductal carcinoma, grade 2, estrogen receptor2% "positive," progesterone receptor negative, HER-2 positive, with an MIB-1 of 81%.  (1) neoadjuvant chemotherapy will consist of carboplatin, docetaxel, trastuzumab and pertuzumab beginning 09/23/14  (2) trastuzumab will be continued to complete 1 year  (3) surgery will follow chemotherapy  (4) adjuvant radiation will follow surgery  (5) consider anti-estrogens after radiation  (6) genetics testing pending  PLAN: Dakisha looks and feels well today. The labs were reviewed in detail and were stable. She has some treatment related anemia with an hgb of  9.5 today, but the patient is asymptomatic at this time. She is having the beginning symptoms of peripheral neuropathy, but these have so far been transient. We will continue to monitor this, and make adjustments to the docetaxel as planned.  For her vaginal dryness, I suggested coconut oil or water based lubricant. She will take naproxen 568m BID for her left toe swelling that could possibly be the early stages of gout.   SLilyonnawill return in 2 weeks for the start of cycle 4 of treatment. She understands and agrees  with this plan. She knows the goal of treatment in her case is cure. She has been encouraged to call with any issues that might arise before her next visit here.     HLaurie Panda NP   11/11/2014 1:27 PM

## 2014-11-22 ENCOUNTER — Encounter: Payer: Self-pay | Admitting: Nurse Practitioner

## 2014-11-22 ENCOUNTER — Telehealth: Payer: Self-pay | Admitting: *Deleted

## 2014-11-22 NOTE — Progress Notes (Signed)
I faxed req for omeprazole via [facility]

## 2014-11-22 NOTE — Telephone Encounter (Signed)
[facility] faxed Prior authorization request for Omeprazole.  Request to Managed Care for review.

## 2014-11-25 ENCOUNTER — Encounter: Payer: Self-pay | Admitting: Nurse Practitioner

## 2014-11-25 ENCOUNTER — Ambulatory Visit (HOSPITAL_BASED_OUTPATIENT_CLINIC_OR_DEPARTMENT_OTHER): Payer: BC Managed Care – PPO

## 2014-11-25 ENCOUNTER — Ambulatory Visit: Payer: BC Managed Care – PPO | Admitting: Nutrition

## 2014-11-25 ENCOUNTER — Ambulatory Visit (HOSPITAL_BASED_OUTPATIENT_CLINIC_OR_DEPARTMENT_OTHER): Payer: BC Managed Care – PPO | Admitting: Nurse Practitioner

## 2014-11-25 ENCOUNTER — Other Ambulatory Visit: Payer: Self-pay | Admitting: Nurse Practitioner

## 2014-11-25 ENCOUNTER — Telehealth: Payer: Self-pay | Admitting: Nurse Practitioner

## 2014-11-25 ENCOUNTER — Other Ambulatory Visit (HOSPITAL_BASED_OUTPATIENT_CLINIC_OR_DEPARTMENT_OTHER): Payer: BC Managed Care – PPO

## 2014-11-25 VITALS — BP 114/69 | HR 91 | Temp 98.0°F | Resp 18 | Wt 122.4 lb

## 2014-11-25 DIAGNOSIS — C50211 Malignant neoplasm of upper-inner quadrant of right female breast: Secondary | ICD-10-CM

## 2014-11-25 DIAGNOSIS — Z5189 Encounter for other specified aftercare: Secondary | ICD-10-CM

## 2014-11-25 DIAGNOSIS — C50811 Malignant neoplasm of overlapping sites of right female breast: Secondary | ICD-10-CM

## 2014-11-25 DIAGNOSIS — Z5112 Encounter for antineoplastic immunotherapy: Secondary | ICD-10-CM

## 2014-11-25 DIAGNOSIS — Z5111 Encounter for antineoplastic chemotherapy: Secondary | ICD-10-CM

## 2014-11-25 DIAGNOSIS — D539 Nutritional anemia, unspecified: Secondary | ICD-10-CM

## 2014-11-25 DIAGNOSIS — R600 Localized edema: Secondary | ICD-10-CM | POA: Diagnosis not present

## 2014-11-25 LAB — CBC WITH DIFFERENTIAL/PLATELET
BASO%: 0.1 % (ref 0.0–2.0)
Basophils Absolute: 0 10*3/uL (ref 0.0–0.1)
EOS%: 0 % (ref 0.0–7.0)
Eosinophils Absolute: 0 10*3/uL (ref 0.0–0.5)
HEMATOCRIT: 25 % — AB (ref 34.8–46.6)
HEMOGLOBIN: 8.5 g/dL — AB (ref 11.6–15.9)
LYMPH%: 11.4 % — ABNORMAL LOW (ref 14.0–49.7)
MCH: 34.5 pg — AB (ref 25.1–34.0)
MCHC: 33.9 g/dL (ref 31.5–36.0)
MCV: 101.6 fL — AB (ref 79.5–101.0)
MONO#: 0.7 10*3/uL (ref 0.1–0.9)
MONO%: 8.1 % (ref 0.0–14.0)
NEUT%: 80.4 % — AB (ref 38.4–76.8)
NEUTROS ABS: 7.2 10*3/uL — AB (ref 1.5–6.5)
Platelets: 229 10*3/uL (ref 145–400)
RBC: 2.46 10*6/uL — ABNORMAL LOW (ref 3.70–5.45)
RDW: 16.8 % — AB (ref 11.2–14.5)
WBC: 8.9 10*3/uL (ref 3.9–10.3)
lymph#: 1 10*3/uL (ref 0.9–3.3)

## 2014-11-25 LAB — COMPREHENSIVE METABOLIC PANEL (CC13)
ALT: 30 U/L (ref 0–55)
AST: 25 U/L (ref 5–34)
Albumin: 3.5 g/dL (ref 3.5–5.0)
Alkaline Phosphatase: 59 U/L (ref 40–150)
Anion Gap: 8 meq/L (ref 3–11)
BUN: 7.4 mg/dL (ref 7.0–26.0)
CO2: 27 meq/L (ref 22–29)
Calcium: 8.9 mg/dL (ref 8.4–10.4)
Chloride: 103 meq/L (ref 98–109)
Creatinine: 0.7 mg/dL (ref 0.6–1.1)
EGFR: >90 ml/min/1.73 m2 (ref >90)
Glucose: 100 mg/dL (ref 70–140)
Potassium: 3.7 meq/L (ref 3.5–5.1)
Sodium: 138 meq/L (ref 136–145)
Total Bilirubin: 0.28 mg/dL (ref 0.20–1.20)
Total Protein: 6.2 g/dL — ABNORMAL LOW (ref 6.4–8.3)

## 2014-11-25 LAB — VITAMIN B12

## 2014-11-25 LAB — FOLATE: Folate: 18.8 ng/mL

## 2014-11-25 MED ORDER — SODIUM CHLORIDE 0.9 % IV SOLN
Freq: Once | INTRAVENOUS | Status: AC
  Administered 2014-11-25: 15:00:00 via INTRAVENOUS
  Filled 2014-11-25: qty 8

## 2014-11-25 MED ORDER — SODIUM CHLORIDE 0.9 % IV SOLN
420.0000 mg | Freq: Once | INTRAVENOUS | Status: AC
  Administered 2014-11-25: 420 mg via INTRAVENOUS
  Filled 2014-11-25: qty 14

## 2014-11-25 MED ORDER — SODIUM CHLORIDE 0.9 % IV SOLN
460.0000 mg | Freq: Once | INTRAVENOUS | Status: AC
  Administered 2014-11-25: 460 mg via INTRAVENOUS
  Filled 2014-11-25: qty 46

## 2014-11-25 MED ORDER — HEPARIN SOD (PORK) LOCK FLUSH 100 UNIT/ML IV SOLN
500.0000 [IU] | Freq: Once | INTRAVENOUS | Status: AC | PRN
  Administered 2014-11-25: 500 [IU]
  Filled 2014-11-25: qty 5

## 2014-11-25 MED ORDER — DIPHENHYDRAMINE HCL 25 MG PO CAPS
25.0000 mg | ORAL_CAPSULE | Freq: Once | ORAL | Status: AC
  Administered 2014-11-25: 25 mg via ORAL

## 2014-11-25 MED ORDER — PEGFILGRASTIM 6 MG/0.6ML ~~LOC~~ PSKT
6.0000 mg | PREFILLED_SYRINGE | Freq: Once | SUBCUTANEOUS | Status: AC
  Administered 2014-11-25: 6 mg via SUBCUTANEOUS
  Filled 2014-11-25: qty 0.6

## 2014-11-25 MED ORDER — ACETAMINOPHEN 325 MG PO TABS
ORAL_TABLET | ORAL | Status: AC
  Filled 2014-11-25: qty 2

## 2014-11-25 MED ORDER — ACETAMINOPHEN 325 MG PO TABS
650.0000 mg | ORAL_TABLET | Freq: Once | ORAL | Status: AC
  Administered 2014-11-25: 650 mg via ORAL

## 2014-11-25 MED ORDER — TRASTUZUMAB CHEMO INJECTION 440 MG
6.0000 mg/kg | Freq: Once | INTRAVENOUS | Status: AC
  Administered 2014-11-25: 336 mg via INTRAVENOUS
  Filled 2014-11-25: qty 16

## 2014-11-25 MED ORDER — SODIUM CHLORIDE 0.9 % IJ SOLN
10.0000 mL | INTRAMUSCULAR | Status: DC | PRN
  Administered 2014-11-25: 10 mL
  Filled 2014-11-25: qty 10

## 2014-11-25 MED ORDER — DOCETAXEL CHEMO INJECTION 160 MG/16ML
75.0000 mg/m2 | Freq: Once | INTRAVENOUS | Status: AC
  Administered 2014-11-25: 120 mg via INTRAVENOUS
  Filled 2014-11-25: qty 12

## 2014-11-25 MED ORDER — DIPHENHYDRAMINE HCL 25 MG PO CAPS
ORAL_CAPSULE | ORAL | Status: AC
  Filled 2014-11-25: qty 1

## 2014-11-25 MED ORDER — SODIUM CHLORIDE 0.9 % IV SOLN
Freq: Once | INTRAVENOUS | Status: AC
  Administered 2014-11-25: 13:00:00 via INTRAVENOUS

## 2014-11-25 NOTE — Telephone Encounter (Signed)
Sent msg to add IVF on Wed/Thurs this week per 06/27 POF, I will contact pt with times ... KJ

## 2014-11-25 NOTE — Progress Notes (Signed)
Patient was identified to be at risk for malnutrition on the MST secondary to weight loss and poor appetite.    59 year old female diagnosed with breast cancer.  She is a patient of Dr. Jana Hakim.  Past medical history includes anxiety, hypercholesterolemia, depression, CP, tobacco abuse, DDD, MI, headache, arthritis, and GERD.  Medications include Xanax, B complex, calcium with vitamin D, Questran, Klonopin, Decadron, Zetia, fish oil, Ativan, multivitamin, Prilosec, Zofran, Compazine, Zoloft and Zantac.  Labs include BUN 6.2 on June 13.  Height: 5 feet 2 inches. Weight: 116.5 pounds on June 13.  Weight today documented as 122.4 pounds. Usual body weight: 125 pounds. BMI: 22.38.  Patient reports history of nausea and decreased appetite Patient noted diarrhea 4 daily. Patient is eating about 2 times a day. Patient will drink milk shakes but doesn't typically consume milk. Patient does not like eating first thing in the morning.  Nutrition diagnosis:  Unintended weight loss related to breast cancer and associated treatments as evidenced by 9 pound weight loss from usual body weight.  Intervention: Patient reports she has been eating better reflecting weight increase today. Educated patient on strategies for eating if she has nausea or diarrhea. Encouraged patient to consume small frequent meals and snacks with high protein foods.  Provided fact sheet on increasing calories and protein. Educated patient on strategies for eating with diarrhea and provided additional fact sheet Questions were answered.  Teach back method used.  Monitoring, evaluation, goals: Patient will tolerate adequate calories and protein to minimize weight loss.  Next visit: Patient contact me with questions or concerns.  **Disclaimer: This note was dictated with voice recognition software. Similar sounding words can inadvertently be transcribed and this note may contain transcription errors which may not have been  corrected upon publication of note.**

## 2014-11-25 NOTE — Patient Instructions (Signed)
[facility] Discharge Instructions for Patients Receiving Chemotherapy  Today you received the following chemotherapy agents:  Herceptin, Perjeta, Taxotere and Carboplatin  To help prevent nausea and vomiting after your treatment, we encourage you to take your nausea medication as ordered per MD.   If you develop nausea and vomiting that is not controlled by your nausea medication, call the clinic.   BELOW ARE SYMPTOMS THAT SHOULD BE REPORTED IMMEDIATELY:  *FEVER GREATER THAN 100.5 F  *CHILLS WITH OR WITHOUT FEVER  NAUSEA AND VOMITING THAT IS NOT CONTROLLED WITH YOUR NAUSEA MEDICATION  *UNUSUAL SHORTNESS OF BREATH  *UNUSUAL BRUISING OR BLEEDING  TENDERNESS IN MOUTH AND THROAT WITH OR WITHOUT PRESENCE OF ULCERS  *URINARY PROBLEMS  *BOWEL PROBLEMS  UNUSUAL RASH Items with * indicate a potential emergency and should be followed up as soon as possible.  Feel free to call the clinic you have any questions or concerns. The clinic phone number is [phone].  Please show the [place] at check-in to the Emergency Department and triage nurse.

## 2014-11-25 NOTE — Progress Notes (Signed)
[place]  [phone] [phone]) [phone]     ID: Beth Rice DOB: Oct 02, 1955  MR#: [id number]  FXO#:[id number]  Patient Care Team: Darlyne Russian, MD as PCP - General (Family Medicine) [name], MD as Consulting Physician (General Surgery) Chauncey Cruel, MD as Consulting Physician (Oncology) Eppie Gibson, MD as Attending Physician (Radiation Oncology) Rockwell Germany, RN as Registered Nurse Mauro Kaufmann, RN as Registered Nurse PCP: [name], MD OTHER MD: Jamie Kato MD  CHIEF COMPLAINT: HER-2 positive breast cancer  CURRENT TREATMENT: Neoadjuvant chemotherapy and anti-HER-2 immunotherapy   BREAST CANCER HISTORY: From the original intake note:  Beth Rice herself palpated a mass in her right breast but "didn't think much about it". She saw [name] for routine gynecologic follow-up and he also palpated and immediately set her up for right diagnostic mammography with tomosynthesis and ultrasonography at the [facility] 08/27/2014. In the upper outer quadrant of the right breast there was a spiculated mass accompanied by calcifications, the complex extending up to 2.5 cm. This was palpable and mobile. It measured approximately 2 cm by palpation. There was no palpable right axillary adenopathy. Ultrasound of the right axilla also showed normal axillary contents.  Ultrasound of the right breast confirmed an irregularly marginated hypoechoic mass measuring 1.8 cm. Biopsy was not immediately performed because of patient was on aspirin at the time. On 08/30/2014 Beth Rice underwent biopsy of the mass in question, with the pathology ([id number]) (showing an invasive ductal carcinoma, grade 2, estrogen receptor 2% positive with moderate staining intensity, progesterone receptor negative, with an MIB-1 of 81% and with HER-2 amplified, the signals ratio being 2.87 and the number per cell 4.30.  On 09/03/2024 she underwent bilateral breast MRI. This  found the breast composition to be category C. In the right breast there was a spiculated mass measuring 2.3 cm in close proximity to the pectoralis but without obvious invasion of the muscle. The rest of the breast, the left breast, and the lymph node areas well otherwise unremarkable.  Her subsequent history is as detailed below   INTERVAL HISTORY: Beth Rice returns for follow up of her breast cancer, accompanied by her sister and a friend. Today is day 1, cycle 4 of 6 planned cycles of of carboplatin, docetaxel, trastuzumab and pertuzumab given every 3 weeks with Onpro support.  REVIEW OF SYSTEMS: Beth Rice denies fevers, chills, nausea, vomiting, or changes in bowel or bladder habits. She is eating and drinking well, but she does complain of gum changes, and loose teeth. She denies mouth sores or rashes. The neuropathy symptoms have resolved everywhere, minus her left great toe which still swells off and on. Treating it (like gout) with naproxen helped. Her right leg is just slightly more edematous than the left, but both are retaining fluid. A detailed review of systems is otherwise stable.  PAST MEDICAL HISTORY: Past Medical History  Diagnosis Date  . Anxiety   . Elevated cholesterol   . Depression   . Chest pain     a. 09/2008 Cath: ER 60%, LM nl, LAD 4m 442m, LCX nondom, nl, RCA dom, 2089mDA/PLA nl.  . Tobacco abuse   . H/O degenerative disc disease   . Myocardial infarction     "I think I had a heart attack in 2010"  . Headache     "frequency depends on the weather" (06/04/2014)  . Arthritis     "neck" (06/04/2014)  . Chronic lower back pain   .  Gastroesophageal reflux disease     H/O  . Hot flashes     PAST SURGICAL HISTORY: Past Surgical History  Procedure Laterality Date  . Ovarian cyst removal  1980's?  . Tonsillectomy    . Cardiac catheterization  10/15/2008    noncritical mid & distal third anterior descending disease  . Portacath placement Left 09/17/2014    Procedure:  INSERTION PORT-A-CATH;  Surgeon: [name], MD;  Location: [facility];  Service: General;  Laterality: Left;    FAMILY HISTORY Family History  Problem Relation Age of Onset  . Hypertension Sister   . Heart disease Mother   . Alzheimer's disease Mother   . Cerebral aneurysm Father   . Mental illness Father   . Cancer Maternal Aunt 45    ovarian  . Cancer Other 34    mat great aunt (through [facility]) with colorectal cancer  . Cancer Other 65    mat great uncle (through [facility]) with prostate cancer  . Cancer Cousin 94    paternal female first cousin through an uncle with pancreatic cancer  The patient's father died at the age of 25 after having cerebral hemorrhages and age 46 and 33. The patient's mother died at the age of 58 with Parkinson's and Alzheimer's disease. The patient's mother had one sister and this sister was diagnosed with ovarian cancer in her early 68s. There is no other history of breast or ovarian cancer in the family.   GYNECOLOGIC HISTORY:  No LMP recorded. Patient is postmenopausal.  menarche age 1, the patient is GX P0. She stopped having periods in 2005. She did not take hormone replacement.   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 She is in process of retiring, with targeted retirement date in June. She is single, lives alone with 4 dogs.    ADVANCED DIRECTIVES: Not in place. She tells me she plans to name her sister Beth Rice as her healthcare power of attorney. Beth Rice (who is a Marine scientist) can be reached in [place] at [phone] (cell) or [phone] (home).   HEALTH MAINTENANCE: History  Substance Use Topics  . Smoking status: Former Smoker -- 1.00 packs/day for 20 years    Types: Cigarettes    Quit date: 06/21/2014  . Smokeless tobacco: Never Used  . Alcohol Use: 1.8 oz/week    3 Cans of beer, 0 Standard drinks or equivalent per week     Colonoscopy:  2006/[facility]  PPI:RJJOA 2016   Bone density:09/10/2008 at [facility]/normal   Lipid panel:  Allergies  Allergen Reactions  . Penicillins Hives  . Statins     Patient has significant muscle cramps with statins. She cannot tolerate them.  . Sulfa Antibiotics Hives    Current Outpatient Prescriptions  Medication Sig Dispense Refill  . aspirin EC 81 MG tablet Take 81 mg by mouth daily.    Marland Kitchen b complex vitamins capsule Take 1 capsule by mouth at bedtime.     . Calcium Carbonate-Vitamin D (CALCIUM + D PO) Take 1 tablet by mouth at bedtime.     . cholestyramine (QUESTRAN) 4 G packet Take 1 packet (4 g total) by mouth 2 (two) times daily. 60 each 1  . clonazePAM (KLONOPIN) 1 MG tablet TAKE 1 TABLET BY MOUTH AT BEDTIME AS NEEDED FOR ANXIETY 90 tablet 0  . dexamethasone (DECADRON) 4 MG tablet Take 2 tablets (8 mg total) by mouth 2 (two) times daily. Start  the day before Taxotere. Then again the day after chemo for 3 days. 30 tablet 1  . ezetimibe (ZETIA) 10 MG tablet Take 1 tablet (10 mg total) by mouth daily. 30 tablet 11  . fish oil-omega-3 fatty acids 1000 MG capsule Take 1 g by mouth at bedtime.     Marland Kitchen levocetirizine (XYZAL) 5 MG tablet TAKE 1 TABLET (5 MG TOTAL) BY MOUTH EVERY EVENING. 30 tablet 3  . lidocaine-prilocaine (EMLA) cream Apply to affected area once 30 g 3  . LORazepam (ATIVAN) 0.5 MG tablet Take 1 tablet (0.5 mg total) by mouth every 6 (six) hours as needed (Nausea or vomiting). 30 tablet 0  . Lysine 500 MG CAPS Take 500 mg by mouth at bedtime.     . Multiple Vitamins-Minerals (MULTIVITAMIN WITH MINERALS) tablet Take 1 tablet by mouth at bedtime.     Marland Kitchen omeprazole (PRILOSEC) 40 MG capsule Take 1 capsule (40 mg total) by mouth daily. 30 capsule 2  . ondansetron (ZOFRAN) 8 MG tablet Take 1 tablet (8 mg total) by mouth 2 (two) times daily. Start the day after chemo for 3 days. Then take as needed for nausea or vomiting. 30 tablet 1  . ranitidine (ZANTAC) 150 MG  tablet TAKE 1 TABLET (150 MG TOTAL) BY MOUTH 2 (TWO) TIMES DAILY. 60 tablet 3  . Salicylic Acid 3 % SHAM Apply 1 application topically daily. 177 mL 0  . sertraline (ZOLOFT) 50 MG tablet Take 1 tablet (50 mg total) by mouth daily. 30 tablet 11  . acyclovir (ZOVIRAX) 400 MG tablet Take 1 tablet (400 mg total) by mouth 2 (two) times daily. (Patient not taking: Reported on 11/11/2014) 60 tablet 3  . ALPRAZolam (XANAX) 0.5 MG tablet TAKE 1 TABLET BY MOUTH EVERY 6 HOURS AS NEEDED FOR MUSCLE SPASMS (Patient not taking: Reported on 11/25/2014) 30 tablet 2  . fluconazole (DIFLUCAN) 100 MG tablet Take 1 tablet (100 mg total) by mouth daily. (Patient not taking: Reported on 11/11/2014) 10 tablet 3  . fluticasone (FLONASE) 50 MCG/ACT nasal spray Place 2 sprays into both nostrils daily. (Patient not taking: Reported on 11/25/2014) 16 g 11  . oxyCODONE-acetaminophen (ROXICET) 5-325 MG per tablet Take 1-2 tablets by mouth every 4 (four) hours as needed for severe pain. (Patient not taking: Reported on 11/11/2014) 30 tablet 0  . prochlorperazine (COMPAZINE) 10 MG tablet Take 1 tablet (10 mg total) by mouth every 6 (six) hours as needed (Nausea or vomiting). (Patient not taking: Reported on 11/25/2014) 30 tablet 1   No current facility-administered medications for this visit.    OBJECTIVE: middle-aged white woman who appears stated age 11 Vitals:   11/25/14 1143  BP: 114/69  Pulse: 91  Temp: 98 F (36.7 C)  Resp: 18     Body mass index is 22.38 kg/(m^2).    ECOG FS:1 - Symptomatic but completely ambulatory  Skin: warm, dry  HEENT: sclerae anicteric, conjunctivae pink, oropharynx clear. No thrush or mucositis.  Lymph Nodes: No cervical or supraclavicular lymphadenopathy  Lungs: clear to auscultation bilaterally, no rales, wheezes, or rhonci  Heart: regular rate and rhythm  Abdomen: round, soft, non tender, positive bowel sounds  Musculoskeletal: No focal spinal tenderness, trace edema to left leg, no  redness or tenderness Neuro: non focal, well oriented, positive affect  Breasts: deferred  LAB RESULTS:  CMP     Component Value Date/Time   NA 138 11/25/2014 1109   NA 139 06/18/2014 1133   K 3.7 11/25/2014 1109  K 4.2 06/18/2014 1133   CL 103 06/18/2014 1133   CO2 27 11/25/2014 1109   CO2 27 06/18/2014 1133   GLUCOSE 100 11/25/2014 1109   GLUCOSE 93 06/18/2014 1133   BUN 7.4 11/25/2014 1109   BUN 7 06/18/2014 1133   CREATININE 0.7 11/25/2014 1109   CREATININE 0.73 06/18/2014 1133   CREATININE 0.64 06/03/2014 1203   CALCIUM 8.9 11/25/2014 1109   CALCIUM 9.3 06/18/2014 1133   PROT 6.2* 11/25/2014 1109   PROT 6.8 10/23/2013 1030   ALBUMIN 3.5 11/25/2014 1109   ALBUMIN 4.5 10/23/2013 1030   AST 25 11/25/2014 1109   AST 25 10/23/2013 1030   ALT 30 11/25/2014 1109   ALT 16 10/23/2013 1030   ALKPHOS 59 11/25/2014 1109   ALKPHOS 68 10/23/2013 1030   BILITOT 0.28 11/25/2014 1109   BILITOT 0.7 10/23/2013 1030   GFRNONAA >90 06/03/2014 1203   GFRNONAA >89 10/23/2013 1030   GFRAA >90 06/03/2014 1203   GFRAA >89 10/23/2013 1030    INo results found for: SPEP, UPEP  Lab Results  Component Value Date   WBC 8.9 11/25/2014   NEUTROABS 7.2* 11/25/2014   HGB 8.5* 11/25/2014   HCT 25.0* 11/25/2014   MCV 101.6* 11/25/2014   PLT 229 11/25/2014      Chemistry      Component Value Date/Time   NA 138 11/25/2014 1109   NA 139 06/18/2014 1133   K 3.7 11/25/2014 1109   K 4.2 06/18/2014 1133   CL 103 06/18/2014 1133   CO2 27 11/25/2014 1109   CO2 27 06/18/2014 1133   BUN 7.4 11/25/2014 1109   BUN 7 06/18/2014 1133   CREATININE 0.7 11/25/2014 1109   CREATININE 0.73 06/18/2014 1133   CREATININE 0.64 06/03/2014 1203      Component Value Date/Time   CALCIUM 8.9 11/25/2014 1109   CALCIUM 9.3 06/18/2014 1133   ALKPHOS 59 11/25/2014 1109   ALKPHOS 68 10/23/2013 1030   AST 25 11/25/2014 1109   AST 25 10/23/2013 1030   ALT 30 11/25/2014 1109   ALT 16 10/23/2013 1030    BILITOT 0.28 11/25/2014 1109   BILITOT 0.7 10/23/2013 1030       No results found for: LABCA2  No components found for: LABCA125  No results for input(s): INR in the last 168 hours.  Urinalysis    Component Value Date/Time   COLORURINE CANCELED 08/21/2014 1204   APPEARANCEUR CANCELED 08/21/2014 1204   LABSPEC CANCELED 08/21/2014 1204   PHURINE CANCELED 08/21/2014 1204   GLUCOSEU CANCELED 08/21/2014 1204   HGBUR CANCELED 08/21/2014 1204   BILIRUBINUR CANCELED 08/21/2014 1204   BILIRUBINUR neg 06/18/2014 1103   KETONESUR CANCELED 08/21/2014 1204   PROTEINUR CANCELED 08/21/2014 1204   PROTEINUR neg 06/18/2014 1103   UROBILINOGEN CANCELED 08/21/2014 1204   UROBILINOGEN 0.2 06/18/2014 1103   NITRITE CANCELED 08/21/2014 1204   NITRITE neg 06/18/2014 1103   LEUKOCYTESUR CANCELED 08/21/2014 1204    STUDIES: No results found.  ASSESSMENT: 59 y.o. [place] woman status post right breast biopsy 08/30/2014 for a clinical T2 N0, stage IIA invasive ductal carcinoma, grade 2, estrogen receptor2% "positive," progesterone receptor negative, HER-2 positive, with an MIB-1 of 81%.  (1) neoadjuvant chemotherapy will consist of carboplatin, docetaxel, trastuzumab and pertuzumab beginning 09/23/14  (2) trastuzumab will be continued to complete 1 year  (3) surgery will follow chemotherapy  (4) adjuvant radiation will follow surgery  (5) consider anti-estrogens after radiation  (6) genetics testing pending  PLAN: Beth Rice  continues to manage treatment well. The labs were reviewed in detail and her hgb is down to 8.5 today. She is asymptomatic at this time, so she will proceed with cycle 4 of carboplatin, docetaxel, trastuzumab, and pertuzumab as planned today. I am having a folate and vitamin B12 level added to her lab work today to check for insufficiencies because she also has a borderline high MCV today.   I advised her to avoid sodium intake, elevate her leg when not walking or  driving, and to wear compression stockings. The swelling to her bilateral extremities (L>R) is minimal at this point. She will continue to treat her toe with naproxen as needed.   Verne will return in 1 week for labs and a nadir visit. She understands and agrees with this plan. She knows the goal of treatment in her case is cure. She has been encouraged to call with any issues that might arise before her next visit here.     Laurie Panda, NP   11/25/2014 12:28 PM

## 2014-11-26 ENCOUNTER — Telehealth: Payer: Self-pay | Admitting: *Deleted

## 2014-11-26 ENCOUNTER — Telehealth: Payer: Self-pay | Admitting: Nurse Practitioner

## 2014-11-26 NOTE — Telephone Encounter (Signed)
Received call from pt wanting to know results of Vit B 12 and folate levels. Discussed with pt her results. Her Vit B 12 levels were very elevated > 2000, Folate was normal 18.8. I did ask the pt had she been taking any B complex vitamins or any forms of energy drinks with extra Vitamin B. Pt told me she stop the B vitamins in January but had been drinking [employer] drinks for the past 2 months, recently stopped. Advised the patient to stop these drinks altogether. Pt agreed with plan. No further concerns.

## 2014-11-26 NOTE — Telephone Encounter (Signed)
PT. WOULD LIKE RESULTS OF FOLATE AND VITAMIN B12 LEVEL WHICH WERE DRAWN 11/25/14.

## 2014-11-26 NOTE — Telephone Encounter (Signed)
S/w pt confirming IVF's scheduled for Wed/Thur this week D/T... KJ

## 2014-11-27 ENCOUNTER — Ambulatory Visit (HOSPITAL_BASED_OUTPATIENT_CLINIC_OR_DEPARTMENT_OTHER): Payer: BC Managed Care – PPO

## 2014-11-27 VITALS — BP 111/62 | HR 81 | Temp 98.1°F | Resp 18

## 2014-11-27 DIAGNOSIS — Z5189 Encounter for other specified aftercare: Secondary | ICD-10-CM | POA: Diagnosis not present

## 2014-11-27 DIAGNOSIS — C50811 Malignant neoplasm of overlapping sites of right female breast: Secondary | ICD-10-CM

## 2014-11-27 DIAGNOSIS — C50211 Malignant neoplasm of upper-inner quadrant of right female breast: Secondary | ICD-10-CM

## 2014-11-27 MED ORDER — SODIUM CHLORIDE 0.9 % IJ SOLN
10.0000 mL | INTRAMUSCULAR | Status: DC | PRN
  Administered 2014-11-27: 10 mL
  Filled 2014-11-27: qty 10

## 2014-11-27 MED ORDER — PROMETHAZINE HCL 25 MG/ML IJ SOLN
25.0000 mg | Freq: Once | INTRAMUSCULAR | Status: AC
  Administered 2014-11-27: 25 mg via INTRAVENOUS
  Filled 2014-11-27: qty 1

## 2014-11-27 MED ORDER — SODIUM CHLORIDE 0.9 % IV SOLN
Freq: Once | INTRAVENOUS | Status: AC
  Administered 2014-11-27: 08:00:00 via INTRAVENOUS

## 2014-11-27 MED ORDER — HEPARIN SOD (PORK) LOCK FLUSH 100 UNIT/ML IV SOLN
500.0000 [IU] | Freq: Once | INTRAVENOUS | Status: AC | PRN
  Administered 2014-11-27: 500 [IU]
  Filled 2014-11-27: qty 5

## 2014-11-27 MED ORDER — SODIUM CHLORIDE 0.9 % IV SOLN
Freq: Once | INTRAVENOUS | Status: AC
  Administered 2014-11-27: 09:00:00 via INTRAVENOUS
  Filled 2014-11-27: qty 4

## 2014-11-27 NOTE — Patient Instructions (Signed)

## 2014-11-28 ENCOUNTER — Ambulatory Visit (HOSPITAL_BASED_OUTPATIENT_CLINIC_OR_DEPARTMENT_OTHER): Payer: BC Managed Care – PPO

## 2014-11-28 VITALS — BP 111/64 | HR 77 | Temp 99.0°F | Resp 16

## 2014-11-28 DIAGNOSIS — C50811 Malignant neoplasm of overlapping sites of right female breast: Secondary | ICD-10-CM | POA: Diagnosis not present

## 2014-11-28 DIAGNOSIS — Z5189 Encounter for other specified aftercare: Secondary | ICD-10-CM

## 2014-11-28 DIAGNOSIS — E860 Dehydration: Secondary | ICD-10-CM

## 2014-11-28 MED ORDER — SODIUM CHLORIDE 0.9 % IV SOLN
1000.0000 mL | Freq: Once | INTRAVENOUS | Status: AC
  Administered 2014-11-28: 08:00:00 via INTRAVENOUS

## 2014-11-28 MED ORDER — HEPARIN SOD (PORK) LOCK FLUSH 100 UNIT/ML IV SOLN
500.0000 [IU] | INTRAVENOUS | Status: AC | PRN
Start: 2014-11-28 — End: 2014-11-28
  Administered 2014-11-28: 500 [IU]
  Filled 2014-11-28: qty 5

## 2014-11-28 MED ORDER — SODIUM CHLORIDE 0.9 % IJ SOLN
10.0000 mL | INTRAMUSCULAR | Status: AC | PRN
  Administered 2014-11-28: 10 mL
  Filled 2014-11-28: qty 10

## 2014-11-28 NOTE — Patient Instructions (Signed)
Dehydration, Adult Dehydration is when you lose more fluids from the body than you take in. Vital organs like the kidneys, brain, and heart cannot function without a proper amount of fluids and salt. Any loss of fluids from the body can cause dehydration.  CAUSES   Vomiting.  Diarrhea.  Excessive sweating.  Excessive urine output.  Fever. SYMPTOMS  Mild dehydration  Thirst.  Dry lips.  Slightly dry mouth. Moderate dehydration  Very dry mouth.  Sunken eyes.  Skin does not bounce back quickly when lightly pinched and released.  Dark urine and decreased urine production.  Decreased tear production.  Headache. Severe dehydration  Very dry mouth.  Extreme thirst.  Rapid, weak pulse (more than 100 beats per minute at rest).  Cold hands and feet.  Not able to sweat in spite of heat and temperature.  Rapid breathing.  Blue lips.  Confusion and lethargy.  Difficulty being awakened.  Minimal urine production.  No tears. DIAGNOSIS  Your caregiver will diagnose dehydration based on your symptoms and your exam. Blood and urine tests will help confirm the diagnosis. The diagnostic evaluation should also identify the cause of dehydration. TREATMENT  Treatment of mild or moderate dehydration can often be done at home by increasing the amount of fluids that you drink. It is best to drink small amounts of fluid more often. Drinking too much at one time can make vomiting worse. Refer to the home care instructions below. Severe dehydration needs to be treated at the hospital where you will probably be given intravenous (IV) fluids that contain water and electrolytes. HOME CARE INSTRUCTIONS   Ask your caregiver about specific rehydration instructions.  Drink enough fluids to keep your urine clear or pale yellow.  Drink small amounts frequently if you have nausea and vomiting.  Eat as you normally do.  Avoid:  Foods or drinks high in sugar.  Carbonated  drinks.  Juice.  Extremely hot or cold fluids.  Drinks with caffeine.  Fatty, greasy foods.  Alcohol.  Tobacco.  Overeating.  Gelatin desserts.  Wash your hands well to avoid spreading bacteria and viruses.  Only take over-the-counter or prescription medicines for pain, discomfort, or fever as directed by your caregiver.  Ask your caregiver if you should continue all prescribed and over-the-counter medicines.  Keep all follow-up appointments with your caregiver. SEEK MEDICAL CARE IF:  You have abdominal pain and it increases or stays in one area (localizes).  You have a rash, stiff neck, or severe headache.  You are irritable, sleepy, or difficult to awaken.  You are weak, dizzy, or extremely thirsty. SEEK IMMEDIATE MEDICAL CARE IF:   You are unable to keep fluids down or you get worse despite treatment.  You have frequent episodes of vomiting or diarrhea.  You have blood or green matter (bile) in your vomit.  You have blood in your stool or your stool looks black and tarry.  You have not urinated in 6 to 8 hours, or you have only urinated a small amount of very dark urine.  You have a fever.  You faint. MAKE SURE YOU:   Understand these instructions.  Will watch your condition.  Will get help right away if you are not doing well or get worse. Document Released: 05/17/2005 Document Revised: 08/09/2011 Document Reviewed: 01/04/2011 ExitCare Patient Information 2015 [employer]. This information is not intended to replace advice given to you by your health care provider. Make sure you discuss any questions you have with your health care   provider.  

## 2014-12-03 ENCOUNTER — Other Ambulatory Visit: Payer: Self-pay | Admitting: *Deleted

## 2014-12-03 ENCOUNTER — Other Ambulatory Visit (HOSPITAL_BASED_OUTPATIENT_CLINIC_OR_DEPARTMENT_OTHER): Payer: BC Managed Care – PPO

## 2014-12-03 ENCOUNTER — Ambulatory Visit (HOSPITAL_BASED_OUTPATIENT_CLINIC_OR_DEPARTMENT_OTHER): Payer: BC Managed Care – PPO | Admitting: Nurse Practitioner

## 2014-12-03 ENCOUNTER — Encounter: Payer: Self-pay | Admitting: Nurse Practitioner

## 2014-12-03 VITALS — BP 112/67 | HR 103 | Temp 98.6°F | Resp 18 | Ht 62.0 in | Wt 115.8 lb

## 2014-12-03 DIAGNOSIS — E876 Hypokalemia: Secondary | ICD-10-CM

## 2014-12-03 DIAGNOSIS — R197 Diarrhea, unspecified: Secondary | ICD-10-CM | POA: Diagnosis not present

## 2014-12-03 DIAGNOSIS — C50811 Malignant neoplasm of overlapping sites of right female breast: Secondary | ICD-10-CM

## 2014-12-03 DIAGNOSIS — K521 Toxic gastroenteritis and colitis: Secondary | ICD-10-CM

## 2014-12-03 DIAGNOSIS — C50211 Malignant neoplasm of upper-inner quadrant of right female breast: Secondary | ICD-10-CM

## 2014-12-03 DIAGNOSIS — B370 Candidal stomatitis: Secondary | ICD-10-CM

## 2014-12-03 DIAGNOSIS — T451X5A Adverse effect of antineoplastic and immunosuppressive drugs, initial encounter: Secondary | ICD-10-CM

## 2014-12-03 DIAGNOSIS — R112 Nausea with vomiting, unspecified: Secondary | ICD-10-CM | POA: Diagnosis not present

## 2014-12-03 DIAGNOSIS — D6481 Anemia due to antineoplastic chemotherapy: Secondary | ICD-10-CM

## 2014-12-03 LAB — CBC WITH DIFFERENTIAL/PLATELET
BASO%: 0.4 % (ref 0.0–2.0)
BASOS ABS: 0 10*3/uL (ref 0.0–0.1)
EOS ABS: 0 10*3/uL (ref 0.0–0.5)
EOS%: 0.5 % (ref 0.0–7.0)
HCT: 26.5 % — ABNORMAL LOW (ref 34.8–46.6)
HEMOGLOBIN: 8.9 g/dL — AB (ref 11.6–15.9)
LYMPH#: 1.7 10*3/uL (ref 0.9–3.3)
LYMPH%: 23 % (ref 14.0–49.7)
MCH: 34.8 pg — ABNORMAL HIGH (ref 25.1–34.0)
MCHC: 33.7 g/dL (ref 31.5–36.0)
MCV: 103.1 fL — ABNORMAL HIGH (ref 79.5–101.0)
MONO#: 0.8 10*3/uL (ref 0.1–0.9)
MONO%: 10.8 % (ref 0.0–14.0)
NEUT%: 65.3 % (ref 38.4–76.8)
NEUTROS ABS: 4.8 10*3/uL (ref 1.5–6.5)
PLATELETS: 118 10*3/uL — AB (ref 145–400)
RBC: 2.57 10*6/uL — ABNORMAL LOW (ref 3.70–5.45)
RDW: 16.6 % — ABNORMAL HIGH (ref 11.2–14.5)
WBC: 7.3 10*3/uL (ref 3.9–10.3)

## 2014-12-03 LAB — COMPREHENSIVE METABOLIC PANEL (CC13)
ALT: 25 U/L (ref 0–55)
ANION GAP: 10 meq/L (ref 3–11)
AST: 20 U/L (ref 5–34)
Albumin: 3.7 g/dL (ref 3.5–5.0)
Alkaline Phosphatase: 78 U/L (ref 40–150)
BILIRUBIN TOTAL: 0.44 mg/dL (ref 0.20–1.20)
BUN: 7 mg/dL (ref 7.0–26.0)
CO2: 30 meq/L — AB (ref 22–29)
CREATININE: 0.8 mg/dL (ref 0.6–1.1)
Calcium: 8.8 mg/dL (ref 8.4–10.4)
Chloride: 98 mEq/L (ref 98–109)
EGFR: 85 mL/min/{1.73_m2} — AB (ref >90)
GLUCOSE: 106 mg/dL (ref 70–140)
Potassium: 3.2 mEq/L — ABNORMAL LOW (ref 3.5–5.1)
Sodium: 138 mEq/L (ref 136–145)
Total Protein: 6.2 g/dL — ABNORMAL LOW (ref 6.4–8.3)

## 2014-12-03 MED ORDER — FLUCONAZOLE 100 MG PO TABS: 100.0000 mg | ORAL_TABLET | Freq: Every day | ORAL | Status: DC

## 2014-12-03 MED ORDER — NYSTATIN 100000 UNIT/ML MT SUSP: 5.0000 mL | Freq: Four times a day (QID) | OROMUCOSAL | Status: DC

## 2014-12-03 MED ORDER — METOCLOPRAMIDE HCL 5 MG PO TABS: 5.0000 mg | ORAL_TABLET | Freq: Three times a day (TID) | ORAL | Status: DC

## 2014-12-03 NOTE — Progress Notes (Signed)
[place]  Telephone:(336) [phone] Fax:(336) [phone]     ID: Beth Rice DOB: 30-Jul-1955  MR#: [id number]  CBJ#:[id number]  Patient Care Team: Darlyne Russian, MD as PCP - General (Family Medicine) [name], MD as Consulting Physician (General Surgery) Chauncey Cruel, MD as Consulting Physician (Oncology) Eppie Gibson, MD as Attending Physician (Radiation Oncology) Rockwell Germany, RN as Registered Nurse Mauro Kaufmann, RN as Registered Nurse PCP: [name], MD OTHER MD: Jamie Kato MD  CHIEF COMPLAINT: HER-2 positive breast cancer  CURRENT TREATMENT: Neoadjuvant chemotherapy and anti-HER-2 immunotherapy   BREAST CANCER HISTORY: From the original intake note:  Beth Rice herself palpated a mass in her right breast but "didn't think much about it". She saw [name] for routine gynecologic follow-up and he also palpated and immediately set her up for right diagnostic mammography with tomosynthesis and ultrasonography at the [facility] 08/27/2014. In the upper outer quadrant of the right breast there was a spiculated mass accompanied by calcifications, the complex extending up to 2.5 cm. This was palpable and mobile. It measured approximately 2 cm by palpation. There was no palpable right axillary adenopathy. Ultrasound of the right axilla also showed normal axillary contents.  Ultrasound of the right breast confirmed an irregularly marginated hypoechoic mass measuring 1.8 cm. Biopsy was not immediately performed because of patient was on aspirin at the time. On 08/30/2014 Beth Rice underwent biopsy of the mass in question, with the pathology ([id number]) (showing an invasive ductal carcinoma, grade 2, estrogen receptor 2% positive with moderate staining intensity, progesterone receptor negative, with an MIB-1 of 81% and with HER-2 amplified, the signals ratio being 2.87 and the number per cell 4.30.  On 09/03/2024 she underwent bilateral breast MRI. This  found the breast composition to be category C. In the right breast there was a spiculated mass measuring 2.3 cm in close proximity to the pectoralis but without obvious invasion of the muscle. The rest of the breast, the left breast, and the lymph node areas well otherwise unremarkable.  Her subsequent history is as detailed below   INTERVAL HISTORY: Beth Rice returns for follow up of her breast cancer, accompanied by a friend. Today is day 8, cycle 4 of 6 planned cycles of of carboplatin, docetaxel, trastuzumab and pertuzumab given every 3 weeks with Onpro support.  REVIEW OF SYSTEMS: Beth Rice is not feeling well today. She had more nausea this cycle than previously, and has vomited daily for the past 3 days. She is using the compazine and zofran, but the waves of nausea tend to happen when she is swallowing. She used diflucan for her thrush, but there is an area to the back of her mouth that is still coated. She has had constant diarrhea and takes imodium 3-4 times daily, but cant use the questran powder because it is too gritty. She had 5 BMs on Saturday and Sunday, 3 yesterday, but just 1 today. She has lost some weight. She still has heartburn on the omeprazole. She has fallen 3 times after some weakness. The swelling to her legs has resolved. A detailed review of systems is otherwise stable.  PAST MEDICAL HISTORY: Past Medical History  Diagnosis Date  . Anxiety   . Elevated cholesterol   . Depression   . Chest pain     a. 09/2008 Cath: ER 60%, LM nl, LAD 80m, 82m/d, LCX nondom, nl, RCA dom, 26m, PDA/PLA nl.  . Tobacco abuse   . H/O degenerative disc disease   .  Myocardial infarction     "I think I had a heart attack in 2010"  . Headache     "frequency depends on the weather" (06/04/2014)  . Arthritis     "neck" (06/04/2014)  . Chronic lower back pain   . Gastroesophageal reflux disease     H/O  . Hot flashes     PAST SURGICAL HISTORY: Past Surgical History  Procedure Laterality Date  .  Ovarian cyst removal  1980's?  . Tonsillectomy    . Cardiac catheterization  10/15/2008    noncritical mid & distal third anterior descending disease  . Portacath placement Left 09/17/2014    Procedure: INSERTION PORT-A-CATH;  Surgeon: [name], MD;  Location: [place];  Service: General;  Laterality: Left;    FAMILY HISTORY Family History  Problem Relation Age of Onset  . Hypertension Sister   . Heart disease Mother   . Alzheimer's disease Mother   . Cerebral aneurysm Father   . Mental illness Father   . Cancer Maternal Aunt 45    ovarian  . Cancer Other 75    mat great aunt (through [facility] with colorectal cancer  . Cancer Other 81    mat great uncle (through [facility]) with prostate cancer  . Cancer Cousin 14    paternal female first cousin through an uncle with pancreatic cancer  The patient's father died at the age of 90 after having cerebral hemorrhages and age 47 and 34. The patient's mother died at the age of 60 with Parkinson's and Alzheimer's disease. The patient's mother had one sister and this sister was diagnosed with ovarian cancer in her early 25s. There is no other history of breast or ovarian cancer in the family.   GYNECOLOGIC HISTORY:  No LMP recorded. Patient is postmenopausal.  menarche age 44, the patient is GX P0. She stopped having periods in 2005. She did not take hormone replacement.   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 She is in process of retiring, with targeted retirement date in June. She is single, lives alone with 4 dogs.    ADVANCED DIRECTIVES: Not in place. She tells me she plans to name her sister [name] as her healthcare power of attorney. [name] (who is a Marine scientist) can be reached in [place] at [phone] (cell) or [phone] (home).   HEALTH MAINTENANCE: History  Substance Use Topics  . Smoking status: Former Smoker -- 1.00 packs/day for 20  years    Types: Cigarettes    Quit date: 06/21/2014  . Smokeless tobacco: Never Used  . Alcohol Use: 1.8 oz/week    3 Cans of beer, 0 Standard drinks or equivalent per week     Colonoscopy: 2006/[facility]  IRW:ERXVQ 2016   B[facility]:09/10/[facility]/normal   Lipid panel:  Allergies  Allergen Reactions  . Penicillins Hives  . Statins     Patient has significant muscle cramps with statins. She cannot tolerate them.  . Sulfa Antibiotics Hives    Current Outpatient Prescriptions  Medication Sig Dispense Refill  . aspirin EC 81 MG tablet Take 81 mg by mouth daily.    Marland Kitchen b complex vitamins capsule Take 1 capsule by mouth at bedtime.     . Calcium Carbonate-Vitamin D (CALCIUM + D PO) Take 1 tablet by mouth at bedtime.     . clonazePAM (KLONOPIN) 1 MG tablet TAKE 1 TABLET BY MOUTH AT BEDTIME AS NEEDED FOR  ANXIETY 90 tablet 0  . ezetimibe (ZETIA) 10 MG tablet Take 1 tablet (10 mg total) by mouth daily. 30 tablet 11  . fish oil-omega-3 fatty acids 1000 MG capsule Take 1 g by mouth at bedtime.     Marland Kitchen levocetirizine (XYZAL) 5 MG tablet TAKE 1 TABLET (5 MG TOTAL) BY MOUTH EVERY EVENING. 30 tablet 3  . LORazepam (ATIVAN) 0.5 MG tablet Take 1 tablet (0.5 mg total) by mouth every 6 (six) hours as needed (Nausea or vomiting). 30 tablet 0  . Lysine 500 MG CAPS Take 500 mg by mouth at bedtime.     . Multiple Vitamins-Minerals (MULTIVITAMIN WITH MINERALS) tablet Take 1 tablet by mouth at bedtime.     Marland Kitchen omeprazole (PRILOSEC) 40 MG capsule Take 1 capsule (40 mg total) by mouth daily. 30 capsule 2  . ondansetron (ZOFRAN) 8 MG tablet Take 1 tablet (8 mg total) by mouth 2 (two) times daily. Start the day after chemo for 3 days. Then take as needed for nausea or vomiting. 30 tablet 1  . prochlorperazine (COMPAZINE) 10 MG tablet Take 1 tablet (10 mg total) by mouth every 6 (six) hours as needed (Nausea or vomiting). 30 tablet 1  . ranitidine (ZANTAC) 150 MG tablet TAKE 1 TABLET  (150 MG TOTAL) BY MOUTH 2 (TWO) TIMES DAILY. 60 tablet 3  . sertraline (ZOLOFT) 50 MG tablet Take 1 tablet (50 mg total) by mouth daily. 30 tablet 11  . acyclovir (ZOVIRAX) 400 MG tablet Take 1 tablet (400 mg total) by mouth 2 (two) times daily. (Patient not taking: Reported on 11/11/2014) 60 tablet 3  . ALPRAZolam (XANAX) 0.5 MG tablet TAKE 1 TABLET BY MOUTH EVERY 6 HOURS AS NEEDED FOR MUSCLE SPASMS (Patient not taking: Reported on 11/25/2014) 30 tablet 2  . cholestyramine (QUESTRAN) 4 G packet Take 1 packet (4 g total) by mouth 2 (two) times daily. (Patient not taking: Reported on 12/03/2014) 60 each 1  . dexamethasone (DECADRON) 4 MG tablet Take 2 tablets (8 mg total) by mouth 2 (two) times daily. Start the day before Taxotere. Then again the day after chemo for 3 days. (Patient not taking: Reported on 12/03/2014) 30 tablet 1  . fluconazole (DIFLUCAN) 100 MG tablet Take 1 tablet (100 mg total) by mouth daily. 10 tablet 3  . fluticasone (FLONASE) 50 MCG/ACT nasal spray Place 2 sprays into both nostrils daily. (Patient not taking: Reported on 11/25/2014) 16 g 11  . lidocaine-prilocaine (EMLA) cream Apply to affected area once (Patient not taking: Reported on 12/03/2014) 30 g 3  . metoCLOPramide (REGLAN) 5 MG tablet Take 1 tablet (5 mg total) by mouth 4 (four) times daily -  before meals and at bedtime. 20 tablet 1  . nystatin (MYCOSTATIN) 100000 UNIT/ML suspension Take 5 mLs (500,000 Units total) by mouth 4 (four) times daily. 240 mL 0  . oxyCODONE-acetaminophen (ROXICET) 5-325 MG per tablet Take 1-2 tablets by mouth every 4 (four) hours as needed for severe pain. (Patient not taking: Reported on 11/11/2014) 30 tablet 0  . Salicylic Acid 3 % SHAM Apply 1 application topically daily. (Patient not taking: Reported on 12/03/2014) 177 mL 0   No current facility-administered medications for this visit.    OBJECTIVE: middle-aged white woman who appears stated age 29 Vitals:   12/03/14 1054  BP: 112/67  Pulse:  103  Temp: 98.6 F (37 C)  Resp: 18     Body mass index is 21.17 kg/(m^2).    ECOG FS:1 - Symptomatic  but completely ambulatory   Sclerae unicteric, pupils round and equal Oropharynx clear and moist-- no thrush or other lesions No cervical or supraclavicular adenopathy Lungs no rales or rhonchi Heart regular rate and rhythm Abd soft, nontender, positive bowel sounds MSK no focal spinal tenderness, no upper extremity lymphedema Neuro: nonfocal, well oriented, appropriate affect Breasts: deferred  LAB RESULTS:  CMP     Component Value Date/Time   NA 138 12/03/2014 1041   NA 139 06/18/2014 1133   K 3.2* 12/03/2014 1041   K 4.2 06/18/2014 1133   CL 103 06/18/2014 1133   CO2 30* 12/03/2014 1041   CO2 27 06/18/2014 1133   GLUCOSE 106 12/03/2014 1041   GLUCOSE 93 06/18/2014 1133   BUN 7.0 12/03/2014 1041   BUN 7 06/18/2014 1133   CREATININE 0.8 12/03/2014 1041   CREATININE 0.73 06/18/2014 1133   CREATININE 0.64 06/03/2014 1203   CALCIUM 8.8 12/03/2014 1041   CALCIUM 9.3 06/18/2014 1133   PROT 6.2* 12/03/2014 1041   PROT 6.8 10/23/2013 1030   ALBUMIN 3.7 12/03/2014 1041   ALBUMIN 4.5 10/23/2013 1030   AST 20 12/03/2014 1041   AST 25 10/23/2013 1030   ALT 25 12/03/2014 1041   ALT 16 10/23/2013 1030   ALKPHOS 78 12/03/2014 1041   ALKPHOS 68 10/23/2013 1030   BILITOT 0.44 12/03/2014 1041   BILITOT 0.7 10/23/2013 1030   GFRNONAA >90 06/03/2014 1203   GFRNONAA >89 10/23/2013 1030   GFRAA >90 06/03/2014 1203   GFRAA >89 10/23/2013 1030    INo results found for: SPEP, UPEP  Lab Results  Component Value Date   WBC 7.3 12/03/2014   NEUTROABS 4.8 12/03/2014   HGB 8.9* 12/03/2014   HCT 26.5* 12/03/2014   MCV 103.1* 12/03/2014   PLT 118* 12/03/2014      Chemistry      Component Value Date/Time   NA 138 12/03/2014 1041   NA 139 06/18/2014 1133   K 3.2* 12/03/2014 1041   K 4.2 06/18/2014 1133   CL 103 06/18/2014 1133   CO2 30* 12/03/2014 1041   CO2 27 06/18/2014  1133   BUN 7.0 12/03/2014 1041   BUN 7 06/18/2014 1133   CREATININE 0.8 12/03/2014 1041   CREATININE 0.73 06/18/2014 1133   CREATININE 0.64 06/03/2014 1203      Component Value Date/Time   CALCIUM 8.8 12/03/2014 1041   CALCIUM 9.3 06/18/2014 1133   ALKPHOS 78 12/03/2014 1041   ALKPHOS 68 10/23/2013 1030   AST 20 12/03/2014 1041   AST 25 10/23/2013 1030   ALT 25 12/03/2014 1041   ALT 16 10/23/2013 1030   BILITOT 0.44 12/03/2014 1041   BILITOT 0.7 10/23/2013 1030       No results found for: LABCA2  No components found for: LABCA125  No results for input(s): INR in the last 168 hours.  Urinalysis    Component Value Date/Time   COLORURINE CANCELED 08/21/2014 1204   APPEARANCEUR CANCELED 08/21/2014 1204   LABSPEC CANCELED 08/21/2014 1204   PHURINE CANCELED 08/21/2014 1204   GLUCOSEU CANCELED 08/21/2014 1204   HGBUR CANCELED 08/21/2014 1204   BILIRUBINUR CANCELED 08/21/2014 1204   BILIRUBINUR neg 06/18/2014 1103   KETONESUR CANCELED 08/21/2014 1204   PROTEINUR CANCELED 08/21/2014 1204   PROTEINUR neg 06/18/2014 1103   UROBILINOGEN CANCELED 08/21/2014 1204   UROBILINOGEN 0.2 06/18/2014 1103   NITRITE CANCELED 08/21/2014 1204   NITRITE neg 06/18/2014 1103   LEUKOCYTESUR CANCELED 08/21/2014 1204    STUDIES: No  results found.  ASSESSMENT: 59 y.o. [place] woman status post right breast biopsy 08/30/2014 for a clinical T2 N0, stage IIA invasive ductal carcinoma, grade 2, estrogen receptor2% "positive," progesterone receptor negative, HER-2 positive, with an MIB-1 of 81%.  (1) neoadjuvant chemotherapy will consist of carboplatin, docetaxel, trastuzumab and pertuzumab beginning 09/23/14  (2) trastuzumab will be continued to complete 1 year  (3) surgery will follow chemotherapy  (4) adjuvant radiation will follow surgery  (5) consider anti-estrogens after radiation  (6) genetics testing pending  PLAN: Damaria had a rough weekend, but I feel like the worst is  behind her. Her diarrhea is slowing down. We will add reglan $RemoveBe'5mg'[id number]$  PRN to her antiemetic list, but she may not even need this as time moves forward. The labs were reviewed in detail and her hgb has actually improved this week to 8.9. Her folate and B12 levels were not deficient. Her potassium is down to 3.2, likely as a result of the vomiting and diarrhea. She has been drinking gatorade to replace her electrolytes and is actually well hydrated since receiving 2L NS last week after treatment before this all began. She will incorporate more potassium rich foods into her diet.   I sent in a prescription for nystatin for her to rinse the thrush away since the diflucan doesn't always clear it completely.   Beth Rice will return in 1 week for labs and a nadir visit. She understands and agrees with this plan. She knows the goal of treatment in her case is cure. She has been encouraged to call with any issues that might arise before her next visit here.     Laurie Panda, NP   12/03/2014 3:16 PM

## 2014-12-03 NOTE — Progress Notes (Signed)
Pt had 3 falls at home since last visit to [facility].

## 2014-12-04 ENCOUNTER — Other Ambulatory Visit: Payer: Self-pay | Admitting: Oncology

## 2014-12-16 ENCOUNTER — Telehealth: Payer: Self-pay | Admitting: *Deleted

## 2014-12-16 ENCOUNTER — Other Ambulatory Visit (HOSPITAL_BASED_OUTPATIENT_CLINIC_OR_DEPARTMENT_OTHER): Payer: BC Managed Care – PPO

## 2014-12-16 ENCOUNTER — Ambulatory Visit (HOSPITAL_BASED_OUTPATIENT_CLINIC_OR_DEPARTMENT_OTHER): Payer: BC Managed Care – PPO | Admitting: Nurse Practitioner

## 2014-12-16 ENCOUNTER — Encounter: Payer: Self-pay | Admitting: Nurse Practitioner

## 2014-12-16 ENCOUNTER — Ambulatory Visit (HOSPITAL_BASED_OUTPATIENT_CLINIC_OR_DEPARTMENT_OTHER): Payer: BC Managed Care – PPO

## 2014-12-16 ENCOUNTER — Other Ambulatory Visit: Payer: Self-pay | Admitting: Oncology

## 2014-12-16 ENCOUNTER — Telehealth: Payer: Self-pay | Admitting: Nurse Practitioner

## 2014-12-16 VITALS — BP 106/61 | HR 83 | Temp 98.6°F | Resp 18 | Ht 62.0 in | Wt 127.5 lb

## 2014-12-16 DIAGNOSIS — D6481 Anemia due to antineoplastic chemotherapy: Secondary | ICD-10-CM | POA: Diagnosis not present

## 2014-12-16 DIAGNOSIS — Z5189 Encounter for other specified aftercare: Secondary | ICD-10-CM | POA: Diagnosis not present

## 2014-12-16 DIAGNOSIS — Z5111 Encounter for antineoplastic chemotherapy: Secondary | ICD-10-CM | POA: Diagnosis not present

## 2014-12-16 DIAGNOSIS — C50211 Malignant neoplasm of upper-inner quadrant of right female breast: Secondary | ICD-10-CM

## 2014-12-16 DIAGNOSIS — G620 Drug-induced polyneuropathy: Secondary | ICD-10-CM

## 2014-12-16 DIAGNOSIS — R600 Localized edema: Secondary | ICD-10-CM | POA: Diagnosis not present

## 2014-12-16 DIAGNOSIS — C50811 Malignant neoplasm of overlapping sites of right female breast: Secondary | ICD-10-CM

## 2014-12-16 DIAGNOSIS — T451X5A Adverse effect of antineoplastic and immunosuppressive drugs, initial encounter: Secondary | ICD-10-CM

## 2014-12-16 LAB — COMPREHENSIVE METABOLIC PANEL (CC13)
ALBUMIN: 3.3 g/dL — AB (ref 3.5–5.0)
ALT: 20 U/L (ref 0–55)
ANION GAP: 6 meq/L (ref 3–11)
AST: 20 U/L (ref 5–34)
Alkaline Phosphatase: 63 U/L (ref 40–150)
BUN: 11.6 mg/dL (ref 7.0–26.0)
CALCIUM: 9.1 mg/dL (ref 8.4–10.4)
CHLORIDE: 105 meq/L (ref 98–109)
CO2: 27 meq/L (ref 22–29)
Creatinine: 0.7 mg/dL (ref 0.6–1.1)
EGFR: >90 mL/min/{1.73_m2} (ref >90)
Glucose: 117 mg/dl (ref 70–140)
POTASSIUM: 4 meq/L (ref 3.5–5.1)
SODIUM: 139 meq/L (ref 136–145)
Total Bilirubin: 0.22 mg/dL (ref 0.20–1.20)
Total Protein: 5.9 g/dL — ABNORMAL LOW (ref 6.4–8.3)

## 2014-12-16 LAB — CBC WITH DIFFERENTIAL/PLATELET
BASO%: 0.1 % (ref 0.0–2.0)
Basophils Absolute: 0 10*3/uL (ref 0.0–0.1)
EOS%: 0 % (ref 0.0–7.0)
Eosinophils Absolute: 0 10*3/uL (ref 0.0–0.5)
HEMATOCRIT: 23.1 % — AB (ref 34.8–46.6)
HGB: 7.8 g/dL — ABNORMAL LOW (ref 11.6–15.9)
LYMPH%: 8.3 % — ABNORMAL LOW (ref 14.0–49.7)
MCH: 36.6 pg — AB (ref 25.1–34.0)
MCHC: 33.8 g/dL (ref 31.5–36.0)
MCV: 108.1 fL — AB (ref 79.5–101.0)
MONO#: 0.5 10*3/uL (ref 0.1–0.9)
MONO%: 7 % (ref 0.0–14.0)
NEUT%: 84.6 % — AB (ref 38.4–76.8)
NEUTROS ABS: 6.4 10*3/uL (ref 1.5–6.5)
PLATELETS: 251 10*3/uL (ref 145–400)
RBC: 2.14 10*6/uL — ABNORMAL LOW (ref 3.70–5.45)
RDW: 18 % — AB (ref 11.2–14.5)
WBC: 7.5 10*3/uL (ref 3.9–10.3)
lymph#: 0.6 10*3/uL — ABNORMAL LOW (ref 0.9–3.3)

## 2014-12-16 MED ORDER — HEPARIN SOD (PORK) LOCK FLUSH 100 UNIT/ML IV SOLN
500.0000 [IU] | Freq: Once | INTRAVENOUS | Status: AC | PRN
  Administered 2014-12-16: 500 [IU]
  Filled 2014-12-16: qty 5

## 2014-12-16 MED ORDER — SODIUM CHLORIDE 0.9 % IV SOLN
420.0000 mg | Freq: Once | INTRAVENOUS | Status: AC
  Administered 2014-12-16: 420 mg via INTRAVENOUS
  Filled 2014-12-16: qty 14

## 2014-12-16 MED ORDER — SODIUM CHLORIDE 0.9 % IV SOLN
Freq: Once | INTRAVENOUS | Status: AC
  Administered 2014-12-16: 13:00:00 via INTRAVENOUS

## 2014-12-16 MED ORDER — SODIUM CHLORIDE 0.9 % IJ SOLN
10.0000 mL | INTRAMUSCULAR | Status: DC | PRN
  Administered 2014-12-16: 10 mL
  Filled 2014-12-16: qty 10

## 2014-12-16 MED ORDER — ACETAMINOPHEN 325 MG PO TABS
ORAL_TABLET | ORAL | Status: AC
  Filled 2014-12-16: qty 2

## 2014-12-16 MED ORDER — SODIUM CHLORIDE 0.9 % IV SOLN
461.5000 mg | Freq: Once | INTRAVENOUS | Status: AC
  Administered 2014-12-16: 460 mg via INTRAVENOUS
  Filled 2014-12-16: qty 46

## 2014-12-16 MED ORDER — ACETAMINOPHEN 325 MG PO TABS
650.0000 mg | ORAL_TABLET | Freq: Once | ORAL | Status: AC
  Administered 2014-12-16: 650 mg via ORAL

## 2014-12-16 MED ORDER — TRASTUZUMAB CHEMO INJECTION 440 MG
6.0000 mg/kg | Freq: Once | INTRAVENOUS | Status: AC
  Administered 2014-12-16: 336 mg via INTRAVENOUS
  Filled 2014-12-16: qty 16

## 2014-12-16 MED ORDER — SODIUM CHLORIDE 0.9 % IV SOLN
Freq: Once | INTRAVENOUS | Status: AC
  Administered 2014-12-16: 15:00:00 via INTRAVENOUS
  Filled 2014-12-16: qty 8

## 2014-12-16 MED ORDER — SODIUM CHLORIDE 0.9 % IV SOLN
800.0000 mg/m2 | Freq: Once | INTRAVENOUS | Status: AC
  Administered 2014-12-16: 1254 mg via INTRAVENOUS
  Filled 2014-12-16: qty 32.98

## 2014-12-16 MED ORDER — DIPHENHYDRAMINE HCL 25 MG PO CAPS
ORAL_CAPSULE | ORAL | Status: AC
  Filled 2014-12-16: qty 1

## 2014-12-16 MED ORDER — DIPHENHYDRAMINE HCL 25 MG PO CAPS
25.0000 mg | ORAL_CAPSULE | Freq: Once | ORAL | Status: AC
  Administered 2014-12-16: 25 mg via ORAL

## 2014-12-16 MED ORDER — PEGFILGRASTIM 6 MG/0.6ML ~~LOC~~ PSKT
6.0000 mg | PREFILLED_SYRINGE | Freq: Once | SUBCUTANEOUS | Status: AC
  Administered 2014-12-16: 6 mg via SUBCUTANEOUS
  Filled 2014-12-16: qty 0.6

## 2014-12-16 NOTE — Telephone Encounter (Signed)
Per staff message and POF I have scheduled appts. Advised scheduler of appts. JMW  

## 2014-12-16 NOTE — Telephone Encounter (Signed)
Gave avs & calendar for July, August, Sept. Left message with [facility] to schedule consult. Sent message to authorize echo.

## 2014-12-16 NOTE — Patient Instructions (Addendum)
[facility] Discharge Instructions for Patients Receiving Chemotherapy  Today you received the following chemotherapy agents:  Herceptin, Perjeta, Gemcitibine and Carboplatin  To help prevent nausea and vomiting after your treatment, we encourage you to take your nausea medication as ordered per MD.   If you develop nausea and vomiting that is not controlled by your nausea medication, call the clinic.   BELOW ARE SYMPTOMS THAT SHOULD BE REPORTED IMMEDIATELY:  *FEVER GREATER THAN 100.5 F  *CHILLS WITH OR WITHOUT FEVER  NAUSEA AND VOMITING THAT IS NOT CONTROLLED WITH YOUR NAUSEA MEDICATION  *UNUSUAL SHORTNESS OF BREATH  *UNUSUAL BRUISING OR BLEEDING  TENDERNESS IN MOUTH AND THROAT WITH OR WITHOUT PRESENCE OF ULCERS  *URINARY PROBLEMS  *BOWEL PROBLEMS  UNUSUAL RASH Items with * indicate a potential emergency and should be followed up as soon as possible.  Feel free to call the clinic you have any questions or concerns. The clinic phone number is [phone].  Please show the [facility] at check-in to the Emergency Department and triage nurse.  The new chemotherapy drug you received today is gemcitibine. Gemcitabine injection What is this medicine? GEMCITABINE (jem SIT a been) is a chemotherapy drug. This medicine is used to treat many types of cancer like breast cancer, lung cancer, pancreatic cancer, and ovarian cancer. This medicine may be used for other purposes; ask your health care provider or pharmacist if you have questions. COMMON BRAND NAME(S): Gemzar What should I tell my health care provider before I take this medicine? They need to know if you have any of these conditions: -blood disorders -infection -kidney disease -liver disease -recent or ongoing radiation therapy -an unusual or allergic reaction to gemcitabine, other chemotherapy, other medicines, foods, dyes, or preservatives -pregnant or trying to get pregnant -breast-feeding How  should I use this medicine? This drug is given as an infusion into a vein. It is administered in a hospital or clinic by a specially trained health care professional. Talk to your pediatrician regarding the use of this medicine in children. Special care may be needed. Overdosage: If you think you have taken too much of this medicine contact a poison control center or emergency room at once. NOTE: This medicine is only for you. Do not share this medicine with others. What if I miss a dose? It is important not to miss your dose. Call your doctor or health care professional if you are unable to keep an appointment. What may interact with this medicine? -medicines to increase blood counts like filgrastim, pegfilgrastim, sargramostim -some other chemotherapy drugs like cisplatin -vaccines Talk to your doctor or health care professional before taking any of these medicines: -acetaminophen -aspirin -ibuprofen -ketoprofen -naproxen This list may not describe all possible interactions. Give your health care provider a list of all the medicines, herbs, non-prescription drugs, or dietary supplements you use. Also tell them if you smoke, drink alcohol, or use illegal drugs. Some items may interact with your medicine. What should I watch for while using this medicine? Visit your doctor for checks on your progress. This drug may make you feel generally unwell. This is not uncommon, as chemotherapy can affect healthy cells as well as cancer cells. Report any side effects. Continue your course of treatment even though you feel ill unless your doctor tells you to stop. In some cases, you may be given additional medicines to help with side effects. Follow all directions for their use. Call your doctor or health care professional for advice if  you get a fever, chills or sore throat, or other symptoms of a cold or flu. Do not treat yourself. This drug decreases your body's ability to fight infections. Try to avoid  being around people who are sick. This medicine may increase your risk to bruise or bleed. Call your doctor or health care professional if you notice any unusual bleeding. Be careful brushing and flossing your teeth or using a toothpick because you may get an infection or bleed more easily. If you have any dental work done, tell your dentist you are receiving this medicine. Avoid taking products that contain aspirin, acetaminophen, ibuprofen, naproxen, or ketoprofen unless instructed by your doctor. These medicines may hide a fever. Women should inform their doctor if they wish to become pregnant or think they might be pregnant. There is a potential for serious side effects to an unborn child. Talk to your health care professional or pharmacist for more information. Do not breast-feed an infant while taking this medicine. What side effects may I notice from receiving this medicine? Side effects that you should report to your doctor or health care professional as soon as possible: -allergic reactions like skin rash, itching or hives, swelling of the face, lips, or tongue -low blood counts - this medicine may decrease the number of white blood cells, red blood cells and platelets. You may be at increased risk for infections and bleeding. -signs of infection - fever or chills, cough, sore throat, pain or difficulty passing urine -signs of decreased platelets or bleeding - bruising, pinpoint red spots on the skin, black, tarry stools, blood in the urine -signs of decreased red blood cells - unusually weak or tired, fainting spells, lightheadedness -breathing problems -chest pain -mouth sores -nausea and vomiting -pain, swelling, redness at site where injected -pain, tingling, numbness in the hands or feet -stomach pain -swelling of ankles, feet, hands -unusual bleeding Side effects that usually do not require medical attention (report to your doctor or health care professional if they continue or  are bothersome): -constipation -diarrhea -hair loss -loss of appetite -stomach upset This list may not describe all possible side effects. Call your doctor for medical advice about side effects. You may report side effects to FDA at 1-800-FDA-1088. Where should I keep my medicine? This drug is given in a hospital or clinic and will not be stored at home. NOTE: This sheet is a summary. It may not cover all possible information. If you have questions about this medicine, talk to your doctor, pharmacist, or health care provider.  2015, [employer] Standard. (2007-09-26 18:45:54)  You also received carboplatin, herceptin and perjecta: Carboplatin injection What is this medicine? CARBOPLATIN (KAR boe pla tin) is a chemotherapy drug. It targets fast dividing cells, like cancer cells, and causes these cells to die. This medicine is used to treat ovarian cancer and many other cancers. This medicine may be used for other purposes; ask your health care provider or pharmacist if you have questions. COMMON BRAND NAME(S): Paraplatin What should I tell my health care provider before I take this medicine? They need to know if you have any of these conditions: -blood disorders -hearing problems -kidney disease -recent or ongoing radiation therapy -an unusual or allergic reaction to carboplatin, cisplatin, other chemotherapy, other medicines, foods, dyes, or preservatives -pregnant or trying to get pregnant -breast-feeding How should I use this medicine? This drug is usually given as an infusion into a vein. It is administered in a hospital or clinic by a specially trained health  care professional. Talk to your pediatrician regarding the use of this medicine in children. Special care may be needed. Overdosage: If you think you have taken too much of this medicine contact a poison control center or emergency room at once. NOTE: This medicine is only for you. Do not share this medicine with  others. What if I miss a dose? It is important not to miss a dose. Call your doctor or health care professional if you are unable to keep an appointment. What may interact with this medicine? -medicines for seizures -medicines to increase blood counts like filgrastim, pegfilgrastim, sargramostim -some antibiotics like amikacin, gentamicin, neomycin, streptomycin, tobramycin -vaccines Talk to your doctor or health care professional before taking any of these medicines: -acetaminophen -aspirin -ibuprofen -ketoprofen -naproxen This list may not describe all possible interactions. Give your health care provider a list of all the medicines, herbs, non-prescription drugs, or dietary supplements you use. Also tell them if you smoke, drink alcohol, or use illegal drugs. Some items may interact with your medicine. What should I watch for while using this medicine? Your condition will be monitored carefully while you are receiving this medicine. You will need important blood work done while you are taking this medicine. This drug may make you feel generally unwell. This is not uncommon, as chemotherapy can affect healthy cells as well as cancer cells. Report any side effects. Continue your course of treatment even though you feel ill unless your doctor tells you to stop. In some cases, you may be given additional medicines to help with side effects. Follow all directions for their use. Call your doctor or health care professional for advice if you get a fever, chills or sore throat, or other symptoms of a cold or flu. Do not treat yourself. This drug decreases your body's ability to fight infections. Try to avoid being around people who are sick. This medicine may increase your risk to bruise or bleed. Call your doctor or health care professional if you notice any unusual bleeding. Be careful brushing and flossing your teeth or using a toothpick because you may get an infection or bleed more easily. If  you have any dental work done, tell your dentist you are receiving this medicine. Avoid taking products that contain aspirin, acetaminophen, ibuprofen, naproxen, or ketoprofen unless instructed by your doctor. These medicines may hide a fever. Do not become pregnant while taking this medicine. Women should inform their doctor if they wish to become pregnant or think they might be pregnant. There is a potential for serious side effects to an unborn child. Talk to your health care professional or pharmacist for more information. Do not breast-feed an infant while taking this medicine. What side effects may I notice from receiving this medicine? Side effects that you should report to your doctor or health care professional as soon as possible: -allergic reactions like skin rash, itching or hives, swelling of the face, lips, or tongue -signs of infection - fever or chills, cough, sore throat, pain or difficulty passing urine -signs of decreased platelets or bleeding - bruising, pinpoint red spots on the skin, black, tarry stools, nosebleeds -signs of decreased red blood cells - unusually weak or tired, fainting spells, lightheadedness -breathing problems -changes in hearing -changes in vision -chest pain -high blood pressure -low blood counts - This drug may decrease the number of white blood cells, red blood cells and platelets. You may be at increased risk for infections and bleeding. -nausea and vomiting -pain, swelling, redness or  irritation at the injection site -pain, tingling, numbness in the hands or feet -problems with balance, talking, walking -trouble passing urine or change in the amount of urine Side effects that usually do not require medical attention (report to your doctor or health care professional if they continue or are bothersome): -hair loss -loss of appetite -metallic taste in the mouth or changes in taste This list may not describe all possible side effects. Call your  doctor for medical advice about side effects. You may report side effects to FDA at 1-800-FDA-1088. Where should I keep my medicine? This drug is given in a hospital or clinic and will not be stored at home. NOTE: This sheet is a summary. It may not cover all possible information. If you have questions about this medicine, talk to your doctor, pharmacist, or health care provider.  2015, [employer]/Gold Standard. (2007-08-22 14:38:05)  Trastuzumab injection for infusion What is this medicine? TRASTUZUMAB (tras TOO zoo mab) is a monoclonal antibody. It targets a protein called HER2. This protein is found in some stomach and breast cancers. This medicine can stop cancer cell growth. This medicine may be used with other cancer treatments. This medicine may be used for other purposes; ask your health care provider or pharmacist if you have questions. COMMON BRAND NAME(S): Herceptin What should I tell my health care provider before I take this medicine? They need to know if you have any of these conditions: -heart disease -heart failure -infection (especially a virus infection such as chickenpox, cold sores, or herpes) -lung or breathing disease, like asthma -recent or ongoing radiation therapy -an unusual or allergic reaction to trastuzumab, benzyl alcohol, or other medications, foods, dyes, or preservatives -pregnant or trying to get pregnant -breast-feeding How should I use this medicine? This drug is given as an infusion into a vein. It is administered in a hospital or clinic by a specially trained health care professional. Talk to your pediatrician regarding the use of this medicine in children. This medicine is not approved for use in children. Overdosage: If you think you have taken too much of this medicine contact a poison control center or emergency room at once. NOTE: This medicine is only for you. Do not share this medicine with others. What if I miss a dose? It is important not to  miss a dose. Call your doctor or health care professional if you are unable to keep an appointment. What may interact with this medicine? -cyclophosphamide -doxorubicin -warfarin This list may not describe all possible interactions. Give your health care provider a list of all the medicines, herbs, non-prescription drugs, or dietary supplements you use. Also tell them if you smoke, drink alcohol, or use illegal drugs. Some items may interact with your medicine. What should I watch for while using this medicine? Visit your doctor for checks on your progress. Report any side effects. Continue your course of treatment even though you feel ill unless your doctor tells you to stop. Call your doctor or health care professional for advice if you get a fever, chills or sore throat, or other symptoms of a cold or flu. Do not treat yourself. Try to avoid being around people who are sick. You may experience fever, chills and shaking during your first infusion. These effects are usually mild and can be treated with other medicines. Report any side effects during the infusion to your health care professional. Fever and chills usually do not happen with later infusions. What side effects may I notice from receiving  this medicine? Side effects that you should report to your doctor or other health care professional as soon as possible: -breathing difficulties -chest pain or palpitations -cough -dizziness or fainting -fever or chills, sore throat -skin rash, itching or hives -swelling of the legs or ankles -unusually weak or tired Side effects that usually do not require medical attention (report to your doctor or other health care professional if they continue or are bothersome): -loss of appetite -headache -muscle aches -nausea This list may not describe all possible side effects. Call your doctor for medical advice about side effects. You may report side effects to FDA at 1-800-FDA-1088. Where should I  keep my medicine? This drug is given in a hospital or clinic and will not be stored at home. NOTE: This sheet is a summary. It may not cover all possible information. If you have questions about this medicine, talk to your doctor, pharmacist, or health care provider.  2015, [employer] Standard. (2009-03-21 13:43:15)  Pertuzumab injection What is this medicine? PERTUZUMAB (per TOOZ ue mab) is a monoclonal antibody that targets a protein called HER2. HER2 is found in some breast cancers. This medicine can stop cancer cell growth. This medicine is used with other cancer treatments. This medicine may be used for other purposes; ask your health care provider or pharmacist if you have questions. COMMON BRAND NAME(S): PERJETA What should I tell my health care provider before I take this medicine? They need to know if you have any of these conditions: -heart disease -heart failure -high blood pressure -history of irregular heart beat -recent or ongoing radiation therapy -an unusual or allergic reaction to pertuzumab, other medicines, foods, dyes, or preservatives -pregnant or trying to get pregnant -breast-feeding How should I use this medicine? This medicine is for infusion into a vein. It is given by a health care professional in a hospital or clinic setting. Talk to your pediatrician regarding the use of this medicine in children. Special care may be needed. Overdosage: If you think you've taken too much of this medicine contact a poison control center or emergency room at once. Overdosage: If you think you have taken too much of this medicine contact a poison control center or emergency room at once. NOTE: This medicine is only for you. Do not share this medicine with others. What if I miss a dose? It is important not to miss your dose. Call your doctor or health care professional if you are unable to keep an appointment. What may interact with this medicine? Interactions are not  expected. Give your health care provider a list of all the medicines, herbs, non-prescription drugs, or dietary supplements you use. Also tell them if you smoke, drink alcohol, or use illegal drugs. Some items may interact with your medicine. This list may not describe all possible interactions. Give your health care provider a list of all the medicines, herbs, non-prescription drugs, or dietary supplements you use. Also tell them if you smoke, drink alcohol, or use illegal drugs. Some items may interact with your medicine. What should I watch for while using this medicine? Your condition will be monitored carefully while you are receiving this medicine. Report any side effects. Continue your course of treatment even though you feel ill unless your doctor tells you to stop. Do not become pregnant while taking this medicine. Women should inform their doctor if they wish to become pregnant or think they might be pregnant. There is a potential for serious side effects to an unborn child. Talk  to your health care professional or pharmacist for more information. Do not breast-feed an infant while taking this medicine. Call your doctor or health care professional for advice if you get a fever, chills or sore throat, or other symptoms of a cold or flu. Do not treat yourself. Try to avoid being around people who are sick. You may experience fever, chills, and headache during the infusion. Report any side effects during the infusion to your health care professional. What side effects may I notice from receiving this medicine? Side effects that you should report to your doctor or health care professional as soon as possible: -breathing problems -chest pain or palpitations -dizziness -feeling faint or lightheaded -fever or chills -skin rash, itching or hives -sore throat -swelling of the face, lips, or tongue -swelling of the legs or ankles -unusually weak or tired Side effects that usually do not require  medical attention (Report these to your doctor or health care professional if they continue or are bothersome.): -diarrhea -hair loss -nausea, vomiting -tiredness This list may not describe all possible side effects. Call your doctor for medical advice about side effects. You may report side effects to FDA at 1-800-FDA-1088. Where should I keep my medicine? This drug is given in a hospital or clinic and will not be stored at home. NOTE: This sheet is a summary. It may not cover all possible information. If you have questions about this medicine, talk to your doctor, pharmacist, or health care provider.  2015, [employer] Standard. (2012-03-15 16:54:15)

## 2014-12-16 NOTE — Progress Notes (Signed)
Ok for treatment today despite low hgb., per Nira Conn per Dr. Jana Hakim.

## 2014-12-16 NOTE — Progress Notes (Signed)
[place]  Telephone:([phone] Fax:(336) [phone]     ID: Beth Rice DOB: 1955-10-07  MR#: [id number]  NZV#:[id number]  Patient Care Team: Darlyne Russian, MD as PCP - General (Family Medicine) [name], MD as Consulting Physician (General Surgery) Chauncey Cruel, MD as Consulting Physician (Oncology) Eppie Gibson, MD as Attending Physician (Radiation Oncology) Rockwell Germany, RN as Registered Nurse Mauro Kaufmann, RN as Registered Nurse PCP: [name], MD OTHER MD: Jamie Kato MD  CHIEF COMPLAINT: HER-2 positive breast cancer  CURRENT TREATMENT: Neoadjuvant chemotherapy and anti-HER-2 immunotherapy   BREAST CANCER HISTORY: From the original intake note:  Beth Rice herself palpated a mass in her right breast but "didn't think much about it". She saw [name] for routine gynecologic follow-up and he also palpated and immediately set her up for right diagnostic mammography with tomosynthesis and ultrasonography at the [facility] Center 08/27/2014. In the upper outer quadrant of the right breast there was a spiculated mass accompanied by calcifications, the complex extending up to 2.5 cm. This was palpable and mobile. It measured approximately 2 cm by palpation. There was no palpable right axillary adenopathy. Ultrasound of the right axilla also showed normal axillary contents.  Ultrasound of the right breast confirmed an irregularly marginated hypoechoic mass measuring 1.8 cm. Biopsy was not immediately performed because of patient was on aspirin at the time. On 08/30/2014 Beth Rice underwent biopsy of the mass in question, with the pathology ([id number]) (showing an invasive ductal carcinoma, grade 2, estrogen receptor 2% positive with moderate staining intensity, progesterone receptor negative, with an MIB-1 of 81% and with HER-2 amplified, the signals ratio being 2.87 and the number per cell 4.30.  On 09/03/2024 she underwent bilateral breast MRI. This  found the breast composition to be category C. In the right breast there was a spiculated mass measuring 2.3 cm in close proximity to the pectoralis but without obvious invasion of the muscle. The rest of the breast, the left breast, and the lymph node areas well otherwise unremarkable.  Her subsequent history is as detailed below   INTERVAL HISTORY: Beth Rice returns for follow up of her breast cancer, accompanied by a friend. Today is day 1, cycle 5 of 6 planned cycles of of carboplatin, docetaxel, trastuzumab and pertuzumab given every 3 weeks with Onpro support.  REVIEW OF SYSTEMS: Beth Rice's previous swelling to her legs has returned and worked its way from her bilateral ankles to mid thigh. There is no pitting but her skin is pulled tight and it is uncomfortable for her. She takes aleve for the pain PRN. Since starting on $RemoveBef'5mg'NkBSKJnNVm$  reglan with her last cycle, she has had no nausea and she is moving her bowels well with no diarrhea. Her appetite has been excellent for the past week. Nystatin cleared her thrush. She has persistent numbness to her bilateral fingertips and left great toe, but it is not very far down the digit pad. She has had a runny nose for the past 24 hrs and took claritin for the first time this morning. A detailed review of systems is otherwise stable.   PAST MEDICAL HISTORY: Past Medical History  Diagnosis Date  . Anxiety   . Elevated cholesterol   . Depression   . Chest pain     a. 09/2008 Cath: ER 60%, LM nl, LAD 32m, 17m/d, LCX nondom, nl, RCA dom, 50m, PDA/PLA nl.  . Tobacco abuse   . H/O degenerative disc disease   . Myocardial infarction     "  I think I had a heart attack in 2010"  . Headache     "frequency depends on the weather" (06/04/2014)  . Arthritis     "neck" (06/04/2014)  . Chronic lower back pain   . Gastroesophageal reflux disease     H/O  . Hot flashes     PAST SURGICAL HISTORY: Past Surgical History  Procedure Laterality Date  . Ovarian cyst removal   1980's?  . Tonsillectomy    . Cardiac catheterization  10/15/2008    noncritical mid & distal third anterior descending disease  . Portacath placement Left 09/17/2014    Procedure: INSERTION PORT-A-CATH;  Surgeon: [name], MD;  Location: [facility];  Service: General;  Laterality: Left;    FAMILY HISTORY Family History  Problem Relation Age of Onset  . Hypertension Sister   . Heart disease Mother   . Alzheimer's disease Mother   . Cerebral aneurysm Father   . Mental illness Father   . Cancer Maternal Aunt 45    ovarian  . Cancer Other 14    mat great aunt (through [facility]) with colorectal cancer  . Cancer Other 83    mat great uncle (through [facility]) with prostate cancer  . Cancer Cousin 60    paternal female first cousin through an uncle with pancreatic cancer  The patient's father died at the age of 21 after having cerebral hemorrhages and age 64 and 69. The patient's mother died at the age of 2 with Parkinson's and Alzheimer's disease. The patient's mother had one sister and this sister was diagnosed with ovarian cancer in her early 38s. There is no other history of breast or ovarian cancer in the family.   GYNECOLOGIC HISTORY:  No LMP recorded. Patient is postmenopausal.  menarche age 2, the patient is GX P0. She stopped having periods in 2005. She did not take hormone replacement.   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 She is in process of retiring, with targeted retirement date in June. She is single, lives alone with 4 dogs.    ADVANCED DIRECTIVES: Not in place. She tells me she plans to name her sister Beth Rice as her healthcare power of attorney. Beth Rice (who is a Marine scientist) can be reached in [place] at [phone] (cell) or [phone] (home).   HEALTH MAINTENANCE: History  Substance Use Topics  . Smoking status: Former Smoker -- 1.00 packs/day for 20 years    Types:  Cigarettes    Quit date: 06/21/2014  . Smokeless tobacco: Never Used  . Alcohol Use: 1.8 oz/week    3 Cans of beer, 0 Standard drinks or equivalent per week     Colonoscopy: 2006/[facility]  XMD:YJWLK 2016   Bone density:09/10/[facility]/normal   Lipid panel:  Allergies  Allergen Reactions  . Penicillins Hives  . Statins     Patient has significant muscle cramps with statins. She cannot tolerate them.  . Sulfa Antibiotics Hives    Current Outpatient Prescriptions  Medication Sig Dispense Refill  . acyclovir (ZOVIRAX) 400 MG tablet Take 1 tablet (400 mg total) by mouth 2 (two) times daily. 60 tablet 3  . ALPRAZolam (XANAX) 0.5 MG tablet TAKE 1 TABLET BY MOUTH EVERY 6 HOURS AS NEEDED FOR MUSCLE SPASMS 30 tablet 2  . aspirin EC 81 MG tablet Take 81 mg by mouth daily.    Marland Kitchen b complex vitamins capsule Take 1 capsule by mouth at bedtime.     Marland Kitchen  Calcium Carbonate-Vitamin D (CALCIUM + D PO) Take 1 tablet by mouth at bedtime.     . cholestyramine (QUESTRAN) 4 G packet Take 1 packet (4 g total) by mouth 2 (two) times daily. 60 each 1  . clonazePAM (KLONOPIN) 1 MG tablet TAKE 1 TABLET BY MOUTH AT BEDTIME AS NEEDED FOR ANXIETY 90 tablet 0  . dexamethasone (DECADRON) 4 MG tablet Take 2 tablets (8 mg total) by mouth 2 (two) times daily. Start the day before Taxotere. Then again the day after chemo for 3 days. 30 tablet 1  . ezetimibe (ZETIA) 10 MG tablet Take 1 tablet (10 mg total) by mouth daily. 30 tablet 11  . fish oil-omega-3 fatty acids 1000 MG capsule Take 1 g by mouth at bedtime.     . fluconazole (DIFLUCAN) 100 MG tablet Take 1 tablet (100 mg total) by mouth daily. 10 tablet 3  . levocetirizine (XYZAL) 5 MG tablet TAKE 1 TABLET (5 MG TOTAL) BY MOUTH EVERY EVENING. 30 tablet 3  . lidocaine-prilocaine (EMLA) cream Apply to affected area once 30 g 3  . LORazepam (ATIVAN) 0.5 MG tablet Take 1 tablet (0.5 mg total) by mouth every 6 (six) hours as needed (Nausea or  vomiting). 30 tablet 0  . Lysine 500 MG CAPS Take 500 mg by mouth at bedtime.     . metoCLOPramide (REGLAN) 5 MG tablet Take 1 tablet (5 mg total) by mouth 4 (four) times daily -  before meals and at bedtime. 20 tablet 1  . Multiple Vitamins-Minerals (MULTIVITAMIN WITH MINERALS) tablet Take 1 tablet by mouth at bedtime.     Marland Kitchen nystatin (MYCOSTATIN) 100000 UNIT/ML suspension Take 5 mLs (500,000 Units total) by mouth 4 (four) times daily. 240 mL 0  . omeprazole (PRILOSEC) 40 MG capsule Take 1 capsule (40 mg total) by mouth daily. 30 capsule 2  . ondansetron (ZOFRAN) 8 MG tablet Take 1 tablet (8 mg total) by mouth 2 (two) times daily. Start the day after chemo for 3 days. Then take as needed for nausea or vomiting. 30 tablet 1  . prochlorperazine (COMPAZINE) 10 MG tablet Take 1 tablet (10 mg total) by mouth every 6 (six) hours as needed (Nausea or vomiting). 30 tablet 1  . ranitidine (ZANTAC) 150 MG tablet TAKE 1 TABLET (150 MG TOTAL) BY MOUTH 2 (TWO) TIMES DAILY. 60 tablet 3  . sertraline (ZOLOFT) 50 MG tablet Take 1 tablet (50 mg total) by mouth daily. 30 tablet 11  . fluticasone (FLONASE) 50 MCG/ACT nasal spray Place 2 sprays into both nostrils daily. (Patient not taking: Reported on 11/25/2014) 16 g 11  . oxyCODONE-acetaminophen (ROXICET) 5-325 MG per tablet Take 1-2 tablets by mouth every 4 (four) hours as needed for severe pain. (Patient not taking: Reported on 11/11/2014) 30 tablet 0  . Salicylic Acid 3 % SHAM Apply 1 application topically daily. (Patient not taking: Reported on 12/03/2014) 177 mL 0   No current facility-administered medications for this visit.    OBJECTIVE: middle-aged white Rice who appears stated age 59 Vitals:   12/16/14 1042  BP: 106/61  Pulse: 83  Temp: 98.6 F (37 C)  Resp: 18     Body mass index is 23.31 kg/(m^2).    ECOG FS:1 - Symptomatic but completely ambulatory  Skin: warm, dry  HEENT: sclerae anicteric, conjunctivae pink, oropharynx clear. No thrush or  mucositis.  Lymph Nodes: No cervical or supraclavicular lymphadenopathy  Lungs: clear to auscultation bilaterally, no rales, wheezes, or rhonci  Heart: regular  rate and rhythm  Abdomen: round, soft, non tender, positive bowel sounds  Musculoskeletal: No focal spinal tenderness, tight bilateral lower extremity edema   Neuro: non focal, well oriented, positive affect  Breasts: deferred  LAB RESULTS:  CMP     Component Value Date/Time   NA 139 12/16/2014 1027   NA 139 06/18/2014 1133   K 4.0 12/16/2014 1027   K 4.2 06/18/2014 1133   CL 103 06/18/2014 1133   CO2 27 12/16/2014 1027   CO2 27 06/18/2014 1133   GLUCOSE 117 12/16/2014 1027   GLUCOSE 93 06/18/2014 1133   BUN 11.6 12/16/2014 1027   BUN 7 06/18/2014 1133   CREATININE 0.7 12/16/2014 1027   CREATININE 0.73 06/18/2014 1133   CREATININE 0.64 06/03/2014 1203   CALCIUM 9.1 12/16/2014 1027   CALCIUM 9.3 06/18/2014 1133   PROT 5.9* 12/16/2014 1027   PROT 6.8 10/23/2013 1030   ALBUMIN 3.3* 12/16/2014 1027   ALBUMIN 4.5 10/23/2013 1030   AST 20 12/16/2014 1027   AST 25 10/23/2013 1030   ALT 20 12/16/2014 1027   ALT 16 10/23/2013 1030   ALKPHOS 63 12/16/2014 1027   ALKPHOS 68 10/23/2013 1030   BILITOT 0.22 12/16/2014 1027   BILITOT 0.7 10/23/2013 1030   GFRNONAA >90 06/03/2014 1203   GFRNONAA >89 10/23/2013 1030   GFRAA >90 06/03/2014 1203   GFRAA >89 10/23/2013 1030    INo results found for: SPEP, UPEP  Lab Results  Component Value Date   WBC 7.5 12/16/2014   NEUTROABS 6.4 12/16/2014   HGB 7.8* 12/16/2014   HCT 23.1* 12/16/2014   MCV 108.1* 12/16/2014   PLT 251 12/16/2014      Chemistry      Component Value Date/Time   NA 139 12/16/2014 1027   NA 139 06/18/2014 1133   K 4.0 12/16/2014 1027   K 4.2 06/18/2014 1133   CL 103 06/18/2014 1133   CO2 27 12/16/2014 1027   CO2 27 06/18/2014 1133   BUN 11.6 12/16/2014 1027   BUN 7 06/18/2014 1133   CREATININE 0.7 12/16/2014 1027   CREATININE 0.73 06/18/2014  1133   CREATININE 0.64 06/03/2014 1203      Component Value Date/Time   CALCIUM 9.1 12/16/2014 1027   CALCIUM 9.3 06/18/2014 1133   ALKPHOS 63 12/16/2014 1027   ALKPHOS 68 10/23/2013 1030   AST 20 12/16/2014 1027   AST 25 10/23/2013 1030   ALT 20 12/16/2014 1027   ALT 16 10/23/2013 1030   BILITOT 0.22 12/16/2014 1027   BILITOT 0.7 10/23/2013 1030       No results found for: LABCA2  No components found for: LABCA125  No results for input(s): INR in the last 168 hours.  Urinalysis    Component Value Date/Time   COLORURINE CANCELED 08/21/2014 1204   APPEARANCEUR CANCELED 08/21/2014 1204   LABSPEC CANCELED 08/21/2014 1204   PHURINE CANCELED 08/21/2014 1204   GLUCOSEU CANCELED 08/21/2014 1204   HGBUR CANCELED 08/21/2014 1204   BILIRUBINUR CANCELED 08/21/2014 1204   BILIRUBINUR neg 06/18/2014 1103   KETONESUR CANCELED 08/21/2014 1204   PROTEINUR CANCELED 08/21/2014 1204   PROTEINUR neg 06/18/2014 1103   UROBILINOGEN CANCELED 08/21/2014 1204   UROBILINOGEN 0.2 06/18/2014 1103   NITRITE CANCELED 08/21/2014 1204   NITRITE neg 06/18/2014 1103   LEUKOCYTESUR CANCELED 08/21/2014 1204    STUDIES: No results found.  ASSESSMENT: 59 y.o. Beth Rice status post right breast biopsy 08/30/2014 for a clinical T2 N0, stage IIA invasive ductal  carcinoma, grade 2, estrogen receptor2% "positive," progesterone receptor negative, HER-2 positive, with an MIB-1 of 81%.  (1) neoadjuvant chemotherapy will consist of carboplatin, docetaxel, trastuzumab and pertuzumab x 6  beginning 09/23/14  (a) docetaxel switched for gemcitabine for cycles 5 and 6 because of acute bilateral lower extremity edema  (2) trastuzumab will be continued to complete 1 year  (3) surgery will follow chemotherapy  (4) adjuvant radiation will follow surgery  (5) consider anti-estrogens after radiation  (6) genetics testing pending  PLAN: Dr. Jana Hakim was consulted with regards to the patient's bilateral  lower extremity edema. The docetaxel is the likely source of this symptom, so we will switch this out for gemcitabine for the final 2 cycles of treatment. 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 Dr. Jana Hakim explained to the patient that the edema will take weeks to clear entirely. She could wrap her legs or wear compression stockings. I offered her a prescription for dyazide, but she declined at this time.  The labs were reviewed in detail and her hgb is down to 7.8. She is asymptomatic at this time. We will go ahead and treat her today, and draw a type and screen with her routine labs next week. If her anemia persists we will transfuse her with 2 units of blood.   Guynell will return in 1 week for labs and a nadir visit. She understands and agrees with this plan. She knows the goal of treatment in her case is cure. She has been encouraged to call with any issues that might arise before her next visit here.    Beth Panda, NP   12/16/2014 11:29 AM    ADDENDUM: Discussed the Taxotere "leakage syndrome" with Beth Rice. This should go away on its own but can take several weeks to do that. In the meantime she can keep her legs elevated or use compression stockings.  She only has 2 cycles to go. Were going to substitute Gemzar, day 1 only, for the Taxotere. We did discuss the possible toxicities, side effects and complications of this agent. Mostly this may affect her blood counts. It is likely to be much better tolerated than the Taxotere she was on.  I am anticipating good news when we repeat her breast MRI preop.!  I personally saw this patient and performed a substantive portion of this encounter with the listed APP documented above.   Chauncey Cruel, MD Medical Oncology and Hematology [facility] [address] [place], [place] [address] Tel. [phone]    Fax. [phone]

## 2014-12-19 ENCOUNTER — Ambulatory Visit: Payer: BC Managed Care – PPO

## 2014-12-20 ENCOUNTER — Ambulatory Visit: Payer: BC Managed Care – PPO

## 2014-12-23 ENCOUNTER — Encounter: Payer: Self-pay | Admitting: Nurse Practitioner

## 2014-12-23 ENCOUNTER — Ambulatory Visit (HOSPITAL_COMMUNITY)
Admission: RE | Admit: 2014-12-23 | Discharge: 2014-12-23 | Disposition: A | Discharge status: Home / Self Care | Payer: BC Managed Care – PPO | Source: Ambulatory Visit | Attending: Oncology | Admitting: Oncology

## 2014-12-23 ENCOUNTER — Ambulatory Visit (HOSPITAL_BASED_OUTPATIENT_CLINIC_OR_DEPARTMENT_OTHER): Payer: BC Managed Care – PPO

## 2014-12-23 ENCOUNTER — Ambulatory Visit (HOSPITAL_BASED_OUTPATIENT_CLINIC_OR_DEPARTMENT_OTHER): Payer: BC Managed Care – PPO | Admitting: Nurse Practitioner

## 2014-12-23 ENCOUNTER — Other Ambulatory Visit: Payer: Self-pay | Admitting: *Deleted

## 2014-12-23 ENCOUNTER — Other Ambulatory Visit (HOSPITAL_BASED_OUTPATIENT_CLINIC_OR_DEPARTMENT_OTHER): Payer: BC Managed Care – PPO

## 2014-12-23 VITALS — BP 111/67 | HR 102 | Temp 98.4°F | Resp 18 | Ht 62.0 in | Wt 118.4 lb

## 2014-12-23 VITALS — BP 122/65 | HR 86 | Temp 98.4°F | Resp 18

## 2014-12-23 DIAGNOSIS — C50211 Malignant neoplasm of upper-inner quadrant of right female breast: Secondary | ICD-10-CM

## 2014-12-23 DIAGNOSIS — T451X5A Adverse effect of antineoplastic and immunosuppressive drugs, initial encounter: Principal | ICD-10-CM

## 2014-12-23 DIAGNOSIS — D6481 Anemia due to antineoplastic chemotherapy: Secondary | ICD-10-CM | POA: Insufficient documentation

## 2014-12-23 DIAGNOSIS — C50811 Malignant neoplasm of overlapping sites of right female breast: Secondary | ICD-10-CM

## 2014-12-23 LAB — CBC WITH DIFFERENTIAL/PLATELET
BASO%: 0.2 % (ref 0.0–2.0)
BASOS ABS: 0 10*3/uL (ref 0.0–0.1)
EOS%: 1.7 % (ref 0.0–7.0)
Eosinophils Absolute: 0.1 10*3/uL (ref 0.0–0.5)
HCT: 24.8 % — ABNORMAL LOW (ref 34.8–46.6)
HEMOGLOBIN: 8.2 g/dL — AB (ref 11.6–15.9)
LYMPH#: 1.6 10*3/uL (ref 0.9–3.3)
LYMPH%: 29.3 % (ref 14.0–49.7)
MCH: 35.5 pg — ABNORMAL HIGH (ref 25.1–34.0)
MCHC: 33.1 g/dL (ref 31.5–36.0)
MCV: 107.4 fL — AB (ref 79.5–101.0)
MONO#: 0.3 10*3/uL (ref 0.1–0.9)
MONO%: 5.4 % (ref 0.0–14.0)
NEUT#: 3.4 10*3/uL (ref 1.5–6.5)
NEUT%: 63.4 % (ref 38.4–76.8)
Platelets: 63 10*3/uL — ABNORMAL LOW (ref 145–400)
RBC: 2.31 10*6/uL — ABNORMAL LOW (ref 3.70–5.45)
RDW: 15.3 % — ABNORMAL HIGH (ref 11.2–14.5)
WBC: 5.4 10*3/uL (ref 3.9–10.3)
nRBC: 0 % (ref 0–0)

## 2014-12-23 LAB — COMPREHENSIVE METABOLIC PANEL (CC13)
ALBUMIN: 3.6 g/dL (ref 3.5–5.0)
ALK PHOS: 91 U/L (ref 40–150)
ALT: 47 U/L (ref 0–55)
ANION GAP: 12 meq/L — AB (ref 3–11)
AST: 29 U/L (ref 5–34)
BUN: 8.5 mg/dL (ref 7.0–26.0)
CALCIUM: 8.6 mg/dL (ref 8.4–10.4)
CO2: 26 mEq/L (ref 22–29)
CREATININE: 0.7 mg/dL (ref 0.6–1.1)
Chloride: 101 mEq/L (ref 98–109)
GLUCOSE: 106 mg/dL (ref 70–140)
POTASSIUM: 3.6 meq/L (ref 3.5–5.1)
Sodium: 139 mEq/L (ref 136–145)
Total Bilirubin: 0.57 mg/dL (ref 0.20–1.20)
Total Protein: 6.2 g/dL — ABNORMAL LOW (ref 6.4–8.3)

## 2014-12-23 LAB — PREPARE RBC (CROSSMATCH)

## 2014-12-23 LAB — URINALYSIS, ROUTINE W REFLEX MICROSCOPIC
BILIRUBIN URINE: NEGATIVE
GLUCOSE, UA: NEGATIVE mg/dL
Hgb urine dipstick: NEGATIVE
Ketones, ur: NEGATIVE mg/dL
Leukocytes, UA: NEGATIVE
Nitrite: NEGATIVE
PH: 6 (ref 5.0–8.0)
Protein, ur: NEGATIVE mg/dL
Specific Gravity, Urine: 1.007 (ref 1.005–1.030)
Urobilinogen, UA: 0.2 mg/dL (ref 0.0–1.0)

## 2014-12-23 LAB — ABO/RH: ABO/RH(D): B POS

## 2014-12-23 LAB — HOLD TUBE, BLOOD BANK

## 2014-12-23 MED ORDER — ACETAMINOPHEN 325 MG PO TABS
650.0000 mg | ORAL_TABLET | Freq: Once | ORAL | Status: AC
  Administered 2014-12-23: 650 mg via ORAL

## 2014-12-23 MED ORDER — DIPHENHYDRAMINE HCL 25 MG PO CAPS
25.0000 mg | ORAL_CAPSULE | Freq: Once | ORAL | Status: AC
  Administered 2014-12-23: 25 mg via ORAL

## 2014-12-23 MED ORDER — SODIUM CHLORIDE 0.9 % IJ SOLN
10.0000 mL | INTRAMUSCULAR | Status: DC | PRN
  Administered 2014-12-23: 10 mL via INTRAVENOUS
  Filled 2014-12-23: qty 10

## 2014-12-23 MED ORDER — METHYLPREDNISOLONE SODIUM SUCC 125 MG IJ SOLR
125.0000 mg | Freq: Once | INTRAMUSCULAR | Status: AC
  Administered 2014-12-23: 125 mg via INTRAVENOUS

## 2014-12-23 MED ORDER — DIPHENHYDRAMINE HCL 50 MG/ML IJ SOLN
25.0000 mg | Freq: Once | INTRAMUSCULAR | Status: AC
  Administered 2014-12-23: 12:00:00 via INTRAVENOUS

## 2014-12-23 MED ORDER — ACETAMINOPHEN 325 MG PO TABS
ORAL_TABLET | ORAL | Status: AC
  Filled 2014-12-23: qty 2

## 2014-12-23 MED ORDER — FAMOTIDINE IN NACL 20-0.9 MG/50ML-% IV SOLN
20.0000 mg | Freq: Two times a day (BID) | INTRAVENOUS | Status: DC
  Administered 2014-12-23: 20 mg via INTRAVENOUS

## 2014-12-23 MED ORDER — HEPARIN SOD (PORK) LOCK FLUSH 100 UNIT/ML IV SOLN
500.0000 [IU] | Freq: Once | INTRAVENOUS | Status: AC
  Administered 2014-12-23: 500 [IU] via INTRAVENOUS
  Filled 2014-12-23: qty 5

## 2014-12-23 MED ORDER — SODIUM CHLORIDE 0.9 % IV SOLN
Freq: Once | INTRAVENOUS | Status: AC
  Administered 2014-12-23: 11:00:00 via INTRAVENOUS

## 2014-12-23 MED ORDER — DIPHENHYDRAMINE HCL 25 MG PO CAPS
ORAL_CAPSULE | ORAL | Status: AC
  Filled 2014-12-23: qty 1

## 2014-12-23 NOTE — Progress Notes (Signed)
Late Entry: Patient here for blood transfusion. First time receiving blood. Consent signed, premeds given. Blood started at 11:30 am and stopped at 11:45. Patient stated,"you told me to tell you when I feel something different. I'm having back pain, it feels like a straight line of pain in my lower back." Blood stopped, normal saline started, Selena Lesser, NP, notified. Orders received. Benadryl, Solu-Medrol and Pepcid given. Blood transfusion reaction labs drawn and UA sent to blood bank along with unit of blood that was hanging. Per Dr. Jana Hakim, do not give anymore blood today. Second unit of blood taken back to blood bank. Per Cyndee Bacon,NP, its OK to discharge when vital signs are stable. Patient ate and drank, gave [place] a urine sample, and back pain was gone. Patient stated,"I feel fine to go home now. I have someone staying with me." OK with Dr. Jana Hakim for discharge. Patient to come back tomorrow, 7/26, to receive blood.

## 2014-12-23 NOTE — Patient Instructions (Signed)

## 2014-12-23 NOTE — Progress Notes (Signed)
[place]  Telephone:(336) [phone] Fax:([phone]) [phone]     ID: GAYLENE MOYLAN DOB: 1965-12-13  MR#: [id number]  YSH#:[id number]  Patient Care Team: Darlyne Russian, MD as PCP - General (Family Medicine) [name], MD as Consulting Physician (General Surgery) Chauncey Cruel, MD as Consulting Physician (Oncology) Eppie Gibson, MD as Attending Physician (Radiation Oncology) Rockwell Germany, RN as Registered Nurse Mauro Kaufmann, RN as Registered Nurse PCP: [name], MD OTHER MD: Jamie Kato MD  CHIEF COMPLAINT: HER-2 positive breast cancer  CURRENT TREATMENT: Neoadjuvant chemotherapy and anti-HER-2 immunotherapy  BREAST CANCER HISTORY: From the original intake note:  Candice herself palpated a mass in her right breast but "didn't think much about it". She saw [name] for routine gynecologic follow-up and he also palpated and immediately set her up for right diagnostic mammography with tomosynthesis and ultrasonography at the [facility] 08/27/2014. In the upper outer quadrant of the right breast there was a spiculated mass accompanied by calcifications, the complex extending up to 2.5 cm. This was palpable and mobile. It measured approximately 2 cm by palpation. There was no palpable right axillary adenopathy. Ultrasound of the right axilla also showed normal axillary contents.  Ultrasound of the right breast confirmed an irregularly marginated hypoechoic mass measuring 1.8 cm. Biopsy was not immediately performed because of patient was on aspirin at the time. On 08/30/2014 [name] underwent biopsy of the mass in question, with the pathology ([id number]) (showing an invasive ductal carcinoma, grade 2, estrogen receptor 2% positive with moderate staining intensity, progesterone receptor negative, with an MIB-1 of 81% and with HER-2 amplified, the signals ratio being 2.87 and the number per cell 4.30.  On 09/03/2024 she underwent bilateral breast MRI. This  found the breast composition to be category C. In the right breast there was a spiculated mass measuring 2.3 cm in close proximity to the pectoralis but without obvious invasion of the muscle. The rest of the breast, the left breast, and the lymph node areas well otherwise unremarkable.  Her subsequent history is as detailed below   INTERVAL HISTORY: Yuleni returns for follow up of her breast cancer, accompanied by a friend. Today is day 8, cycle 5 of 6 planned cycles of of carboplatin, gemcitabine, trastuzumab and pertuzumab given every 3 weeks with Onpro support.   REVIEW OF SYSTEMS: Tyronza denies fevers, chills, nausea, or vomiting. She has had diarrhea this weekend, and is using imodium, but has stopped the questran powder because she doesn't like the texture. Since switching the docetaxel out for gemcitabine last week, the swelling to her legs is almost completely resolved. She does experience leg weakness and generalized fatigue today. She denies mouth sores or rashes. Her neuropathy symptoms are stable. She continues to have a runny nose that bleeds occasionally. She is using saline nasal spray. A detailed review of systems is otherwise stable.  PAST MEDICAL HISTORY: Past Medical History  Diagnosis Date  . Anxiety   . Elevated cholesterol   . Depression   . Chest pain     a. 09/2008 Cath: ER 60%, LM nl, LAD 41m 430m, LCX nondom, nl, RCA dom, 2076mDA/PLA nl.  . Tobacco abuse   . H/O degenerative disc disease   . Myocardial infarction     "I think I had a heart attack in 2010"  . Headache     "frequency depends on the weather" (06/04/2014)  . Arthritis     "neck" (06/04/2014)  . Chronic lower  back pain   . Gastroesophageal reflux disease     H/O  . Hot flashes     PAST SURGICAL HISTORY: Past Surgical History  Procedure Laterality Date  . Ovarian cyst removal  1980's?  . Tonsillectomy    . Cardiac catheterization  10/15/2008    noncritical mid & distal third anterior descending  disease  . Portacath placement Left 09/17/2014    Procedure: INSERTION PORT-A-CATH;  Surgeon: [name], MD;  Location: [facility];  Service: General;  Laterality: Left;    FAMILY HISTORY Family History  Problem Relation Age of Onset  . Hypertension Sister   . Heart disease Mother   . Alzheimer's disease Mother   . Cerebral aneurysm Father   . Mental illness Father   . Cancer Maternal Aunt 45    ovarian  . Cancer Other 39    mat great aunt (through [facility]) with colorectal cancer  . Cancer Other 38    mat great uncle (through [facility]) with prostate cancer  . Cancer Cousin 61    paternal female first cousin through an uncle with pancreatic cancer  The patient's father died at the age of 40 after having cerebral hemorrhages and age 67 and 104. The patient's mother died at the age of 35 with Parkinson's and Alzheimer's disease. The patient's mother had one sister and this sister was diagnosed with ovarian cancer in her early 48s. There is no other history of breast or ovarian cancer in the family.   GYNECOLOGIC HISTORY:  No LMP recorded. Patient is postmenopausal.  menarche age 59, the patient is GX P0. She stopped having periods in 2005. She did not take hormone replacement.   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 She is in process of retiring, with targeted retirement date in June. She is single, lives alone with 4 dogs.    ADVANCED DIRECTIVES: Not in place. She tells me she plans to name her sister [name] as her healthcare power of attorney. [name] (who is a Marine scientist) can be reached in [place] at [phone] (cell) or [phone] (home).   HEALTH MAINTENANCE: History  Substance Use Topics  . Smoking status: Former Smoker -- 1.00 packs/day for 20 years    Types: Cigarettes    Quit date: 06/21/2014  . Smokeless tobacco: Never Used  . Alcohol Use: 1.8 oz/week    3 Cans of beer, 0 Standard  drinks or equivalent per week     Colonoscopy: 2006/[facility]  ZWC:[facility]ENID 2016   Bone density:09/10/[facility] at [facility]/normal   Lipid panel:  Allergies  Allergen Reactions  . Penicillins Hives  . Statins     Patient has significant muscle cramps with statins. She cannot tolerate them.  . Sulfa Antibiotics Hives    Current Outpatient Prescriptions  Medication Sig Dispense Refill  . b complex vitamins capsule Take 1 capsule by mouth at bedtime.     . Calcium Carbonate-Vitamin D (CALCIUM + D PO) Take 1 tablet by mouth at bedtime.     . clonazePAM (KLONOPIN) 1 MG tablet TAKE 1 TABLET BY MOUTH AT BEDTIME AS NEEDED FOR ANXIETY 90 tablet 0  . dexamethasone (DECADRON) 4 MG tablet Take 2 tablets (8 mg total) by mouth 2 (two) times daily. Start the day before Taxotere. Then again the day after chemo for 3 days. 30 tablet 1  . ezetimibe (ZETIA) 10 MG tablet Take 1 tablet (10 mg total) by mouth daily.  30 tablet 11  . fish oil-omega-3 fatty acids 1000 MG capsule Take 1 g by mouth at bedtime.     . fluconazole (DIFLUCAN) 100 MG tablet Take 1 tablet (100 mg total) by mouth daily. 10 tablet 3  . levocetirizine (XYZAL) 5 MG tablet TAKE 1 TABLET (5 MG TOTAL) BY MOUTH EVERY EVENING. 30 tablet 3  . lidocaine-prilocaine (EMLA) cream Apply to affected area once 30 g 3  . LORazepam (ATIVAN) 0.5 MG tablet Take 1 tablet (0.5 mg total) by mouth every 6 (six) hours as needed (Nausea or vomiting). 30 tablet 0  . Lysine 500 MG CAPS Take 500 mg by mouth at bedtime.     . metoCLOPramide (REGLAN) 5 MG tablet Take 1 tablet (5 mg total) by mouth 4 (four) times daily -  before meals and at bedtime. 20 tablet 1  . Multiple Vitamins-Minerals (MULTIVITAMIN WITH MINERALS) tablet Take 1 tablet by mouth at bedtime.     Marland Kitchen nystatin (MYCOSTATIN) 100000 UNIT/ML suspension Take 5 mLs (500,000 Units total) by mouth 4 (four) times daily. 240 mL 0  . omeprazole (PRILOSEC) 40 MG capsule Take 1 capsule (40 mg total) by  mouth daily. 30 capsule 2  . ondansetron (ZOFRAN) 8 MG tablet Take 1 tablet (8 mg total) by mouth 2 (two) times daily. Start the day after chemo for 3 days. Then take as needed for nausea or vomiting. 30 tablet 1  . prochlorperazine (COMPAZINE) 10 MG tablet Take 1 tablet (10 mg total) by mouth every 6 (six) hours as needed (Nausea or vomiting). 30 tablet 1  . ranitidine (ZANTAC) 150 MG tablet TAKE 1 TABLET (150 MG TOTAL) BY MOUTH 2 (TWO) TIMES DAILY. 60 tablet 3  . sertraline (ZOLOFT) 50 MG tablet Take 1 tablet (50 mg total) by mouth daily. 30 tablet 11  . acyclovir (ZOVIRAX) 400 MG tablet Take 1 tablet (400 mg total) by mouth 2 (two) times daily. 60 tablet 3  . ALPRAZolam (XANAX) 0.5 MG tablet TAKE 1 TABLET BY MOUTH EVERY 6 HOURS AS NEEDED FOR MUSCLE SPASMS 30 tablet 2  . aspirin EC 81 MG tablet Take 81 mg by mouth daily.    . cholestyramine (QUESTRAN) 4 G packet Take 1 packet (4 g total) by mouth 2 (two) times daily. (Patient not taking: Reported on 12/23/2014) 60 each 1  . fluticasone (FLONASE) 50 MCG/ACT nasal spray Place 2 sprays into both nostrils daily. (Patient not taking: Reported on 11/25/2014) 16 g 11  . oxyCODONE-acetaminophen (ROXICET) 5-325 MG per tablet Take 1-2 tablets by mouth every 4 (four) hours as needed for severe pain. (Patient not taking: Reported on 11/11/2014) 30 tablet 0  . Salicylic Acid 3 % SHAM Apply 1 application topically daily. (Patient not taking: Reported on 12/03/2014) 177 mL 0   No current facility-administered medications for this visit.    OBJECTIVE: middle-aged white woman who appears stated age 59 Vitals:   12/23/14 0827  BP: 111/67  Pulse: 102  Temp: 98.4 F (36.9 C)  Resp: 18     Body mass index is 21.66 kg/(m^2).    ECOG FS:1 - Symptomatic but completely ambulatory  Sclerae unicteric, pupils round and equal Oropharynx clear and moist-- no thrush or other lesions No cervical or supraclavicular adenopathy Lungs no rales or rhonchi Heart regular  rate and rhythm Abd soft, nontender, positive bowel sounds MSK no focal spinal tenderness, no upper extremity lymphedema Neuro: nonfocal, well oriented, appropriate affect Breasts: deferred  LAB RESULTS:  CMP  Component Value Date/Time   NA 139 12/16/2014 1027   NA 139 06/18/2014 1133   K 4.0 12/16/2014 1027   K 4.2 06/18/2014 1133   CL 103 06/18/2014 1133   CO2 27 12/16/2014 1027   CO2 27 06/18/2014 1133   GLUCOSE 117 12/16/2014 1027   GLUCOSE 93 06/18/2014 1133   BUN 11.6 12/16/2014 1027   BUN 7 06/18/2014 1133   CREATININE 0.7 12/16/2014 1027   CREATININE 0.73 06/18/2014 1133   CREATININE 0.64 06/03/2014 1203   CALCIUM 9.1 12/16/2014 1027   CALCIUM 9.3 06/18/2014 1133   PROT 5.9* 12/16/2014 1027   PROT 6.8 10/23/2013 1030   ALBUMIN 3.3* 12/16/2014 1027   ALBUMIN 4.5 10/23/2013 1030   AST 20 12/16/2014 1027   AST 25 10/23/2013 1030   ALT 20 12/16/2014 1027   ALT 16 10/23/2013 1030   ALKPHOS 63 12/16/2014 1027   ALKPHOS 68 10/23/2013 1030   BILITOT 0.22 12/16/2014 1027   BILITOT 0.7 10/23/2013 1030   GFRNONAA >90 06/03/2014 1203   GFRNONAA >89 10/23/2013 1030   GFRAA >90 06/03/2014 1203   GFRAA >89 10/23/2013 1030    INo results found for: SPEP, UPEP  Lab Results  Component Value Date   WBC 5.4 12/23/2014   NEUTROABS 3.4 12/23/2014   HGB 8.2* 12/23/2014   HCT 24.8* 12/23/2014   MCV 107.4* 12/23/2014   PLT 63* 12/23/2014      Chemistry      Component Value Date/Time   NA 139 12/16/2014 1027   NA 139 06/18/2014 1133   K 4.0 12/16/2014 1027   K 4.2 06/18/2014 1133   CL 103 06/18/2014 1133   CO2 27 12/16/2014 1027   CO2 27 06/18/2014 1133   BUN 11.6 12/16/2014 1027   BUN 7 06/18/2014 1133   CREATININE 0.7 12/16/2014 1027   CREATININE 0.73 06/18/2014 1133   CREATININE 0.64 06/03/2014 1203      Component Value Date/Time   CALCIUM 9.1 12/16/2014 1027   CALCIUM 9.3 06/18/2014 1133   ALKPHOS 63 12/16/2014 1027   ALKPHOS 68 10/23/2013 1030   AST  20 12/16/2014 1027   AST 25 10/23/2013 1030   ALT 20 12/16/2014 1027   ALT 16 10/23/2013 1030   BILITOT 0.22 12/16/2014 1027   BILITOT 0.7 10/23/2013 1030       No results found for: LABCA2  No components found for: LABCA125  No results for input(s): INR in the last 168 hours.  Urinalysis    Component Value Date/Time   COLORURINE CANCELED 08/21/2014 1204   APPEARANCEUR CANCELED 08/21/2014 1204   LABSPEC CANCELED 08/21/2014 1204   PHURINE CANCELED 08/21/2014 1204   GLUCOSEU CANCELED 08/21/2014 1204   HGBUR CANCELED 08/21/2014 1204   BILIRUBINUR CANCELED 08/21/2014 1204   BILIRUBINUR neg 06/18/2014 1103   KETONESUR CANCELED 08/21/2014 1204   PROTEINUR CANCELED 08/21/2014 1204   PROTEINUR neg 06/18/2014 1103   UROBILINOGEN CANCELED 08/21/2014 1204   UROBILINOGEN 0.2 06/18/2014 1103   NITRITE CANCELED 08/21/2014 1204   NITRITE neg 06/18/2014 1103   LEUKOCYTESUR CANCELED 08/21/2014 1204    STUDIES: No results found.  ASSESSMENT: 59 y.o. [place] woman status post right breast biopsy 08/30/2014 for a clinical T2 N0, stage IIA invasive ductal carcinoma, grade 2, estrogen receptor2% "positive," progesterone receptor negative, HER-2 positive, with an MIB-1 of 81%.  (1) neoadjuvant chemotherapy will consist of carboplatin, docetaxel, trastuzumab and pertuzumab x 6  beginning 09/23/14  (a) docetaxel switched for gemcitabine for cycles 5 and 6  because of acute bilateral lower extremity edema  (2) trastuzumab will be continued to complete 1 year  (3) surgery will follow chemotherapy  (4) adjuvant radiation will follow surgery  (5) consider anti-estrogens after radiation  (6) genetics testing pending  PLAN: The labs were reviewed in detail, and while Asucena's hemoglobin has improved just slightly to 8.2, she has become symptomatically anemic. She is already on the schedule for 2 units of blood today, so she will proceed with that appointment.   Kyndell will return in 2  weeks for her final round of treatment. She understands and agrees with this plan. She knows the goal of treatment in her case is cure. She has been encouraged to call with any issues that might arise before her next visit here.    Laurie Panda, NP   12/23/2014 9:05 AM

## 2014-12-24 ENCOUNTER — Other Ambulatory Visit: Payer: Self-pay | Admitting: Nurse Practitioner

## 2014-12-24 ENCOUNTER — Ambulatory Visit (HOSPITAL_BASED_OUTPATIENT_CLINIC_OR_DEPARTMENT_OTHER): Payer: BC Managed Care – PPO

## 2014-12-24 VITALS — BP 115/65 | HR 89 | Temp 98.3°F | Resp 16

## 2014-12-24 DIAGNOSIS — C50811 Malignant neoplasm of overlapping sites of right female breast: Secondary | ICD-10-CM | POA: Diagnosis not present

## 2014-12-24 DIAGNOSIS — D6481 Anemia due to antineoplastic chemotherapy: Secondary | ICD-10-CM | POA: Diagnosis not present

## 2014-12-24 DIAGNOSIS — T451X5A Adverse effect of antineoplastic and immunosuppressive drugs, initial encounter: Principal | ICD-10-CM

## 2014-12-24 LAB — TRANSFUSION REACTION
DAT C3: POSITIVE
POST RXN DAT IGG: NEGATIVE

## 2014-12-24 MED ORDER — DIPHENHYDRAMINE HCL 25 MG PO CAPS
50.0000 mg | ORAL_CAPSULE | Freq: Once | ORAL | Status: AC
  Administered 2014-12-24: 50 mg via ORAL

## 2014-12-24 MED ORDER — SODIUM CHLORIDE 0.9 % IV SOLN
4.0000 mg | Freq: Once | INTRAVENOUS | Status: AC
  Administered 2014-12-24: 4 mg via INTRAVENOUS
  Filled 2014-12-24: qty 0.4

## 2014-12-24 MED ORDER — SODIUM CHLORIDE 0.9 % IJ SOLN
10.0000 mL | INTRAMUSCULAR | Status: AC | PRN
  Administered 2014-12-24: 10 mL
  Filled 2014-12-24: qty 10

## 2014-12-24 MED ORDER — DIPHENHYDRAMINE HCL 25 MG PO CAPS
ORAL_CAPSULE | ORAL | Status: AC
  Filled 2014-12-24: qty 2

## 2014-12-24 MED ORDER — ACETAMINOPHEN 325 MG PO TABS
650.0000 mg | ORAL_TABLET | Freq: Once | ORAL | Status: AC
  Administered 2014-12-24: 650 mg via ORAL

## 2014-12-24 MED ORDER — SODIUM CHLORIDE 0.9 % IV SOLN
250.0000 mL | Freq: Once | INTRAVENOUS | Status: AC
Start: 2014-12-24 — End: 2014-12-24
  Administered 2014-12-24: 250 mL via INTRAVENOUS

## 2014-12-24 MED ORDER — ACETAMINOPHEN 325 MG PO TABS
ORAL_TABLET | ORAL | Status: AC
  Filled 2014-12-24: qty 2

## 2014-12-24 MED ORDER — HEPARIN SOD (PORK) LOCK FLUSH 100 UNIT/ML IV SOLN
500.0000 [IU] | Freq: Every day | INTRAVENOUS | Status: AC | PRN
  Administered 2014-12-24: 500 [IU]
  Filled 2014-12-24: qty 5

## 2014-12-24 NOTE — Patient Instructions (Signed)

## 2014-12-24 NOTE — Progress Notes (Signed)
Pt requesting to have 2nd unit of PRBC's today.  Pt tolerated 1st unit of PRBC's with no difficulties or complaints.

## 2014-12-25 LAB — TYPE AND SCREEN
ABO/RH(D): B POS
ANTIBODY SCREEN: NEGATIVE
UNIT DIVISION: 0
Unit division: 0
Unit division: 0

## 2014-12-29 ENCOUNTER — Other Ambulatory Visit: Payer: Self-pay | Admitting: Nurse Practitioner

## 2014-12-30 ENCOUNTER — Other Ambulatory Visit: Payer: Self-pay | Admitting: *Deleted

## 2014-12-30 DIAGNOSIS — D649 Anemia, unspecified: Secondary | ICD-10-CM

## 2014-12-30 HISTORY — DX: Anemia, unspecified: D64.9

## 2014-12-30 MED ORDER — OMEPRAZOLE 40 MG PO CPDR: 40.0000 mg | DELAYED_RELEASE_CAPSULE | Freq: Every day | ORAL | Status: AC

## 2015-01-01 ENCOUNTER — Other Ambulatory Visit: Payer: Self-pay | Admitting: General Surgery

## 2015-01-02 ENCOUNTER — Other Ambulatory Visit: Payer: Self-pay | Admitting: Emergency Medicine

## 2015-01-03 ENCOUNTER — Other Ambulatory Visit: Payer: Self-pay | Admitting: *Deleted

## 2015-01-03 ENCOUNTER — Telehealth: Payer: Self-pay | Admitting: Oncology

## 2015-01-03 NOTE — Telephone Encounter (Signed)
Confirmed appointment for Echo 08/10

## 2015-01-06 ENCOUNTER — Ambulatory Visit (HOSPITAL_BASED_OUTPATIENT_CLINIC_OR_DEPARTMENT_OTHER): Payer: BC Managed Care – PPO | Admitting: Nurse Practitioner

## 2015-01-06 ENCOUNTER — Encounter: Payer: Self-pay | Admitting: *Deleted

## 2015-01-06 ENCOUNTER — Other Ambulatory Visit: Payer: Self-pay | Admitting: Hematology and Oncology

## 2015-01-06 ENCOUNTER — Encounter: Payer: Self-pay | Admitting: Nurse Practitioner

## 2015-01-06 ENCOUNTER — Telehealth: Payer: Self-pay | Admitting: Nurse Practitioner

## 2015-01-06 ENCOUNTER — Other Ambulatory Visit (HOSPITAL_BASED_OUTPATIENT_CLINIC_OR_DEPARTMENT_OTHER): Payer: BC Managed Care – PPO

## 2015-01-06 ENCOUNTER — Other Ambulatory Visit: Payer: Self-pay | Admitting: *Deleted

## 2015-01-06 ENCOUNTER — Ambulatory Visit (HOSPITAL_BASED_OUTPATIENT_CLINIC_OR_DEPARTMENT_OTHER): Payer: BC Managed Care – PPO

## 2015-01-06 ENCOUNTER — Other Ambulatory Visit: Payer: Self-pay | Admitting: Nurse Practitioner

## 2015-01-06 VITALS — BP 116/72 | HR 96 | Temp 99.5°F | Resp 18 | Ht 62.0 in | Wt 123.7 lb

## 2015-01-06 DIAGNOSIS — Z5189 Encounter for other specified aftercare: Secondary | ICD-10-CM | POA: Diagnosis not present

## 2015-01-06 DIAGNOSIS — C50811 Malignant neoplasm of overlapping sites of right female breast: Secondary | ICD-10-CM

## 2015-01-06 DIAGNOSIS — Z5111 Encounter for antineoplastic chemotherapy: Secondary | ICD-10-CM

## 2015-01-06 DIAGNOSIS — C50211 Malignant neoplasm of upper-inner quadrant of right female breast: Secondary | ICD-10-CM

## 2015-01-06 LAB — COMPREHENSIVE METABOLIC PANEL (CC13)
ALBUMIN: 3.4 g/dL — AB (ref 3.5–5.0)
ALK PHOS: 74 U/L (ref 40–150)
ALT: 50 U/L (ref 0–55)
ANION GAP: 7 meq/L (ref 3–11)
AST: 39 U/L — AB (ref 5–34)
BUN: 5.2 mg/dL — ABNORMAL LOW (ref 7.0–26.0)
CO2: 32 meq/L — AB (ref 22–29)
Calcium: 8.7 mg/dL (ref 8.4–10.4)
Chloride: 103 mEq/L (ref 98–109)
Creatinine: 0.7 mg/dL (ref 0.6–1.1)
EGFR: 88 mL/min/{1.73_m2} — AB (ref >90)
Glucose: 123 mg/dl (ref 70–140)
Potassium: 3.4 mEq/L — ABNORMAL LOW (ref 3.5–5.1)
SODIUM: 142 meq/L (ref 136–145)
Total Bilirubin: 0.26 mg/dL (ref 0.20–1.20)
Total Protein: 6 g/dL — ABNORMAL LOW (ref 6.4–8.3)

## 2015-01-06 LAB — CBC WITH DIFFERENTIAL/PLATELET
BASO%: 0.3 % (ref 0.0–2.0)
BASOS ABS: 0 10*3/uL (ref 0.0–0.1)
EOS%: 1.1 % (ref 0.0–7.0)
Eosinophils Absolute: 0.1 10*3/uL (ref 0.0–0.5)
HEMATOCRIT: 30.4 % — AB (ref 34.8–46.6)
HEMOGLOBIN: 10.2 g/dL — AB (ref 11.6–15.9)
LYMPH#: 1.4 10*3/uL (ref 0.9–3.3)
LYMPH%: 21.7 % (ref 14.0–49.7)
MCH: 34.2 pg — ABNORMAL HIGH (ref 25.1–34.0)
MCHC: 33.6 g/dL (ref 31.5–36.0)
MCV: 102 fL — ABNORMAL HIGH (ref 79.5–101.0)
MONO#: 0.8 10*3/uL (ref 0.1–0.9)
MONO%: 13.1 % (ref 0.0–14.0)
NEUT%: 63.8 % (ref 38.4–76.8)
NEUTROS ABS: 4.1 10*3/uL (ref 1.5–6.5)
PLATELETS: 311 10*3/uL (ref 145–400)
RBC: 2.98 10*6/uL — ABNORMAL LOW (ref 3.70–5.45)
RDW: 17.6 % — AB (ref 11.2–14.5)
WBC: 6.4 10*3/uL (ref 3.9–10.3)

## 2015-01-06 MED ORDER — ACETAMINOPHEN 325 MG PO TABS
ORAL_TABLET | ORAL | Status: AC
  Filled 2015-01-06: qty 2

## 2015-01-06 MED ORDER — DIPHENHYDRAMINE HCL 25 MG PO CAPS
ORAL_CAPSULE | ORAL | Status: AC
  Filled 2015-01-06: qty 1

## 2015-01-06 MED ORDER — PEGFILGRASTIM 6 MG/0.6ML ~~LOC~~ PSKT
6.0000 mg | PREFILLED_SYRINGE | Freq: Once | SUBCUTANEOUS | Status: AC
  Administered 2015-01-06: 6 mg via SUBCUTANEOUS
  Filled 2015-01-06: qty 0.6

## 2015-01-06 MED ORDER — ACETAMINOPHEN 325 MG PO TABS
650.0000 mg | ORAL_TABLET | Freq: Once | ORAL | Status: AC
  Administered 2015-01-06: 650 mg via ORAL

## 2015-01-06 MED ORDER — HEPARIN SOD (PORK) LOCK FLUSH 100 UNIT/ML IV SOLN
500.0000 [IU] | Freq: Once | INTRAVENOUS | Status: AC | PRN
  Administered 2015-01-06: 500 [IU]
  Filled 2015-01-06: qty 5

## 2015-01-06 MED ORDER — SODIUM CHLORIDE 0.9 % IJ SOLN
10.0000 mL | INTRAMUSCULAR | Status: DC | PRN
  Administered 2015-01-06: 10 mL
  Filled 2015-01-06: qty 10

## 2015-01-06 MED ORDER — TRASTUZUMAB CHEMO INJECTION 440 MG
6.0000 mg/kg | Freq: Once | INTRAVENOUS | Status: AC
  Administered 2015-01-06: 336 mg via INTRAVENOUS
  Filled 2015-01-06: qty 16

## 2015-01-06 MED ORDER — SODIUM CHLORIDE 0.9 % IV SOLN
800.0000 mg/m2 | Freq: Once | INTRAVENOUS | Status: AC
  Administered 2015-01-06: 1254 mg via INTRAVENOUS
  Filled 2015-01-06: qty 32.98

## 2015-01-06 MED ORDER — DIPHENHYDRAMINE HCL 25 MG PO CAPS
25.0000 mg | ORAL_CAPSULE | Freq: Once | ORAL | Status: AC
  Administered 2015-01-06: 25 mg via ORAL

## 2015-01-06 MED ORDER — SODIUM CHLORIDE 0.9 % IV SOLN
420.0000 mg | Freq: Once | INTRAVENOUS | Status: AC
  Administered 2015-01-06: 420 mg via INTRAVENOUS
  Filled 2015-01-06: qty 14

## 2015-01-06 MED ORDER — SODIUM CHLORIDE 0.9 % IV SOLN
Freq: Once | INTRAVENOUS | Status: AC
  Administered 2015-01-06: 12:00:00 via INTRAVENOUS

## 2015-01-06 MED ORDER — DEXAMETHASONE SODIUM PHOSPHATE 100 MG/10ML IJ SOLN
Freq: Once | INTRAMUSCULAR | Status: AC
  Administered 2015-01-06: 13:00:00 via INTRAVENOUS
  Filled 2015-01-06: qty 8

## 2015-01-06 MED ORDER — GABAPENTIN 100 MG PO CAPS: 100.0000 mg | ORAL_CAPSULE | Freq: Three times a day (TID) | ORAL | Status: DC

## 2015-01-06 MED ORDER — CLONAZEPAM 1 MG PO TABS: 1.0000 mg | ORAL_TABLET | Freq: Every day | ORAL | Status: DC

## 2015-01-06 MED ORDER — SODIUM CHLORIDE 0.9 % IV SOLN
460.0000 mg | Freq: Once | INTRAVENOUS | Status: AC
  Administered 2015-01-06: 460 mg via INTRAVENOUS
  Filled 2015-01-06: qty 46

## 2015-01-06 NOTE — Progress Notes (Signed)
[place]  [phone] [phone]     ID: MAIKAYLA BEGGS DOB: [birth year]  MR#: [id number]  OIN#:[id number]  Patient Care Team: Darlyne Russian, MD as PCP - General (Family Medicine) [name], MD as Consulting Physician (General Surgery) Chauncey Cruel, MD as Consulting Physician (Oncology) Eppie Gibson, MD as Attending Physician (Radiation Oncology) Rockwell Germany, RN as Registered Nurse Mauro Kaufmann, RN as Registered Nurse PCP: [name], MD OTHER MD: Jamie Kato MD  CHIEF COMPLAINT: HER-2 positive breast cancer  CURRENT TREATMENT: Neoadjuvant chemotherapy and anti-HER-2 immunotherapy  BREAST CANCER HISTORY: From the original intake note:  Monalisa herself palpated a mass in her right breast but "didn't think much about it". She saw [name] for routine gynecologic follow-up and he also palpated and immediately set her up for right diagnostic mammography with tomosynthesis and ultrasonography at the [facility] Center 08/27/2014. In the upper outer quadrant of the right breast there was a spiculated mass accompanied by calcifications, the complex extending up to 2.5 cm. This was palpable and mobile. It measured approximately 2 cm by palpation. There was no palpable right axillary adenopathy. Ultrasound of the right axilla also showed normal axillary contents.  Ultrasound of the right breast confirmed an irregularly marginated hypoechoic mass measuring 1.8 cm. Biopsy was not immediately performed because of patient was on aspirin at the time. On 08/30/2014 [name] underwent biopsy of the mass in question, with the pathology ([id number]) (showing an invasive ductal carcinoma, grade 2, estrogen receptor 2% positive with moderate staining intensity, progesterone receptor negative, with an MIB-1 of 81% and with HER-2 amplified, the signals ratio being 2.87 and the number per cell 4.30.  On 09/03/2024 she underwent bilateral breast MRI. This  found the breast composition to be category C. In the right breast there was a spiculated mass measuring 2.3 cm in close proximity to the pectoralis but without obvious invasion of the muscle. The rest of the breast, the left breast, and the lymph node areas well otherwise unremarkable.  Her subsequent history is as detailed below   INTERVAL HISTORY: Rushie returns for follow up of her breast cancer, accompanied by her sister and a friend. Today is day 1, cycle 6 of 6 planned cycles of of carboplatin, gemcitabine, trastuzumab and pertuzumab given every 3 weeks with Onpro support. Janecia had a blood transfusion reaction last week, but was able to return the next day and complete 2 units. Her energy level is much improved.  REVIEW OF SYSTEMS: Yalanda denies fevers, chills, nausea, vomiting, or changes in bowel or bladder habits. She is eating fairly well despite taste changes. She has gained some weight but it might be fluid to her lower extremities. This time her bilateral thighs seem to be retaining the fluid over her calves and ankles. She believes her chemo brain has been worse lately. She does not sleep as well. 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 A detailed review of systems is otherwise stable.  PAST MEDICAL HISTORY: Past Medical History  Diagnosis Date  . Anxiety   . Elevated cholesterol   . Depression   . Chest pain     a. 09/2008 Cath: ER 60%, LM nl, LAD 30m 425m, LCX nondom, nl, RCA dom, 2069mDA/PLA nl.  . Tobacco abuse   . H/O degenerative disc disease   . Myocardial infarction     "I think I had a heart attack in 2010"  .  Headache     "frequency depends on the weather" (06/04/2014)  . Arthritis     "neck" (06/04/2014)  . Chronic lower back pain   . Gastroesophageal reflux disease     H/O  . Hot flashes     PAST SURGICAL HISTORY: Past Surgical History  Procedure Laterality Date  . Ovarian cyst removal  1980's?  . Tonsillectomy     . Cardiac catheterization  10/15/2008    noncritical mid & distal third anterior descending disease  . Portacath placement Left 09/17/2014    Procedure: INSERTION PORT-A-CATH;  Surgeon: [name], MD;  Location: [facility];  Service: General;  Laterality: Left;    FAMILY HISTORY Family History  Problem Relation Age of Onset  . Hypertension Sister   . Heart disease Mother   . Alzheimer's disease Mother   . Cerebral aneurysm Father   . Mental illness Father   . Cancer Maternal Aunt 45    ovarian  . Cancer Other 29    mat great aunt (through [facility]) with colorectal cancer  . Cancer Other 40    mat great uncle (through [facility]) with prostate cancer  . Cancer Cousin 58    paternal female first cousin through an uncle with pancreatic cancer  The patient's father died at the age of 73 after having cerebral hemorrhages and age 62 and 84. The patient's mother died at the age of 63 with Parkinson's and Alzheimer's disease. The patient's mother had one sister and this sister was diagnosed with ovarian cancer in her early 5s. There is no other history of breast or ovarian cancer in the family.   GYNECOLOGIC HISTORY:  No LMP recorded. Patient is postmenopausal.  menarche age 59, the patient is GX P0. She stopped having periods in 2005. She did not take hormone replacement.   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 She is in process of retiring, with targeted retirement date in June. She is single, lives alone with 4 dogs.    ADVANCED DIRECTIVES: Not in place. She tells me she plans to name her sister [name] as her healthcare power of attorney. [name] (who is a Marine scientist) can be reached in [place] at [phone] (cell) or [phone] (home).   HEALTH MAINTENANCE: History  Substance Use Topics  . Smoking status: Former Smoker -- 1.00 packs/day for 20 years    Types: Cigarettes    Quit date:  06/21/2014  . Smokeless tobacco: Never Used  . Alcohol Use: 1.8 oz/week    3 Cans of beer, 0 Standard drinks or equivalent per week     Colonoscopy: 2006/[facility]  HKF:EXMDY 2016   Bone density:09/10/2008 at [facility]/normal   Lipid panel:  Allergies  Allergen Reactions  . Penicillins Hives  . Statins     Patient has significant muscle cramps with statins. She cannot tolerate them.  . Sulfa Antibiotics Hives    Current Outpatient Prescriptions  Medication Sig Dispense Refill  . acyclovir (ZOVIRAX) 400 MG tablet Take 1 tablet (400 mg total) by mouth 2 (two) times daily. 60 tablet 3  . ALPRAZolam (XANAX) 0.5 MG tablet TAKE 1 TABLET BY MOUTH EVERY 6 HOURS AS NEEDED FOR MUSCLE SPASMS 30 tablet 2  . aspirin EC 81 MG tablet Take 81 mg by mouth daily.    Marland Kitchen b complex vitamins capsule Take 1 capsule by mouth at bedtime.     . Calcium Carbonate-Vitamin D (CALCIUM + D  PO) Take 1 tablet by mouth at bedtime.     . clonazePAM (KLONOPIN) 1 MG tablet Take 1 tablet (1 mg total) by mouth at bedtime. 30 tablet 0  . dexamethasone (DECADRON) 4 MG tablet Take 2 tablets (8 mg total) by mouth 2 (two) times daily. Start the day before Taxotere. Then again the day after chemo for 3 days. 30 tablet 1  . ezetimibe (ZETIA) 10 MG tablet Take 1 tablet (10 mg total) by mouth daily. 30 tablet 11  . fish oil-omega-3 fatty acids 1000 MG capsule Take 1 g by mouth at bedtime.     . fluconazole (DIFLUCAN) 100 MG tablet Take 1 tablet (100 mg total) by mouth daily. 10 tablet 3  . levocetirizine (XYZAL) 5 MG tablet TAKE 1 TABLET (5 MG TOTAL) BY MOUTH EVERY EVENING. 30 tablet 3  . lidocaine-prilocaine (EMLA) cream Apply to affected area once 30 g 3  . LORazepam (ATIVAN) 0.5 MG tablet Take 1 tablet (0.5 mg total) by mouth every 6 (six) hours as needed (Nausea or vomiting). 30 tablet 0  . Lysine 500 MG CAPS Take 500 mg by mouth at bedtime.     . metoCLOPramide (REGLAN) 5 MG tablet Take 1 tablet (5 mg  total) by mouth 4 (four) times daily -  before meals and at bedtime. 20 tablet 1  . Multiple Vitamins-Minerals (MULTIVITAMIN WITH MINERALS) tablet Take 1 tablet by mouth at bedtime.     Marland Kitchen omeprazole (PRILOSEC) 40 MG capsule Take 1 capsule (40 mg total) by mouth daily. 30 capsule 2  . ondansetron (ZOFRAN) 8 MG tablet Take 1 tablet (8 mg total) by mouth 2 (two) times daily. Start the day after chemo for 3 days. Then take as needed for nausea or vomiting. 30 tablet 1  . prochlorperazine (COMPAZINE) 10 MG tablet Take 1 tablet (10 mg total) by mouth every 6 (six) hours as needed (Nausea or vomiting). 30 tablet 1  . ranitidine (ZANTAC) 150 MG tablet TAKE 1 TABLET (150 MG TOTAL) BY MOUTH 2 (TWO) TIMES DAILY. 60 tablet 3  . sertraline (ZOLOFT) 50 MG tablet Take 1 tablet (50 mg total) by mouth daily. 30 tablet 11  . cholestyramine (QUESTRAN) 4 G packet Take 1 packet (4 g total) by mouth 2 (two) times daily. (Patient not taking: Reported on 12/23/2014) 60 each 1  . fluticasone (FLONASE) 50 MCG/ACT nasal spray Place 2 sprays into both nostrils daily. (Patient not taking: Reported on 11/25/2014) 16 g 11  . gabapentin (NEURONTIN) 100 MG capsule Take 1 capsule (100 mg total) by mouth 3 (three) times daily. Take 1-3 capsules up to three times daily 60 capsule 2  . nystatin (MYCOSTATIN) 100000 UNIT/ML suspension Take 5 mLs (500,000 Units total) by mouth 4 (four) times daily. (Patient not taking: Reported on 01/06/2015) 240 mL 0  . oxyCODONE-acetaminophen (ROXICET) 5-325 MG per tablet Take 1-2 tablets by mouth every 4 (four) hours as needed for severe pain. (Patient not taking: Reported on 11/11/2014) 30 tablet 0  . Salicylic Acid 3 % SHAM Apply 1 application topically daily. (Patient not taking: Reported on 12/03/2014) 177 mL 0   No current facility-administered medications for this visit.   Facility-Administered Medications Ordered in Other Visits  Medication Dose Route Frequency Provider Last Rate Last Dose  .  CARBOplatin (PARAPLATIN) 460 mg in sodium chloride 0.9 % 250 mL chemo infusion  460 mg Intravenous Once Chauncey Cruel, MD      . gemcitabine (GEMZAR) 1,254 mg in sodium chloride  0.9 % 100 mL chemo infusion  800 mg/m2 (Treatment Plan Actual) Intravenous Once Chauncey Cruel, MD      . heparin lock flush 100 unit/mL  500 Units Intracatheter Once PRN Chauncey Cruel, MD      . pegfilgrastim (NEULASTA ONPRO KIT) injection 6 mg  6 mg Subcutaneous Once Chauncey Cruel, MD      . pertuzumab (PERJETA) 420 mg in sodium chloride 0.9 % 250 mL chemo infusion  420 mg Intravenous Once Nicholas Lose, MD      . sodium chloride 0.9 % injection 10 mL  10 mL Intracatheter PRN Chauncey Cruel, MD      . trastuzumab (HERCEPTIN) 336 mg in sodium chloride 0.9 % 250 mL chemo infusion  6 mg/kg (Treatment Plan Actual) Intravenous Once Nicholas Lose, MD 532 mL/hr at 01/06/15 1300 336 mg at 01/06/15 1300    OBJECTIVE: middle-aged white woman who appears stated age 59 Vitals:   01/06/15 1057  BP: 116/72  Pulse: 96  Temp: 99.5 F (37.5 C)  Resp: 18     Body mass index is 22.62 kg/(m^2).    ECOG FS:1 - Symptomatic but completely ambulatory  Skin: warm, dry  HEENT: sclerae anicteric, conjunctivae pink, oropharynx clear. No thrush or mucositis.  Lymph Nodes: No cervical or supraclavicular lymphadenopathy  Lungs: clear to auscultation bilaterally, no rales, wheezes, or rhonci  Heart: regular rate and rhythm  Abdomen: round, soft, non tender, positive bowel sounds  Musculoskeletal: No focal spinal tenderness, no peripheral edema  Neuro: non focal, well oriented, positive affect  Breast: right breast mass no longer palpable. Right axilla benign.   LAB RESULTS:  CMP     Component Value Date/Time   NA 142 01/06/2015 1034   NA 139 06/18/2014 1133   K 3.4* 01/06/2015 1034   K 4.2 06/18/2014 1133   CL 103 06/18/2014 1133   CO2 32* 01/06/2015 1034   CO2 27 06/18/2014 1133   GLUCOSE 123 01/06/2015 1034    GLUCOSE 93 06/18/2014 1133   BUN 5.2* 01/06/2015 1034   BUN 7 06/18/2014 1133   CREATININE 0.7 01/06/2015 1034   CREATININE 0.73 06/18/2014 1133   CREATININE 0.64 06/03/2014 1203   CALCIUM 8.7 01/06/2015 1034   CALCIUM 9.3 06/18/2014 1133   PROT 6.0* 01/06/2015 1034   PROT 6.8 10/23/2013 1030   ALBUMIN 3.4* 01/06/2015 1034   ALBUMIN 4.5 10/23/2013 1030   AST 39* 01/06/2015 1034   AST 25 10/23/2013 1030   ALT 50 01/06/2015 1034   ALT 16 10/23/2013 1030   ALKPHOS 74 01/06/2015 1034   ALKPHOS 68 10/23/2013 1030   BILITOT 0.26 01/06/2015 1034   BILITOT 0.7 10/23/2013 1030   GFRNONAA >90 06/03/2014 1203   GFRNONAA >89 10/23/2013 1030   GFRAA >90 06/03/2014 1203   GFRAA >89 10/23/2013 1030    INo results found for: SPEP, UPEP  Lab Results  Component Value Date   WBC 6.4 01/06/2015   NEUTROABS 4.1 01/06/2015   HGB 10.2* 01/06/2015   HCT 30.4* 01/06/2015   MCV 102.0* 01/06/2015   PLT 311 01/06/2015      Chemistry      Component Value Date/Time   NA 142 01/06/2015 1034   NA 139 06/18/2014 1133   K 3.4* 01/06/2015 1034   K 4.2 06/18/2014 1133   CL 103 06/18/2014 1133   CO2 32* 01/06/2015 1034   CO2 27 06/18/2014 1133   BUN 5.2* 01/06/2015 1034   BUN 7 06/18/2014 1133  CREATININE 0.7 01/06/2015 1034   CREATININE 0.73 06/18/2014 1133   CREATININE 0.64 06/03/2014 1203      Component Value Date/Time   CALCIUM 8.7 01/06/2015 1034   CALCIUM 9.3 06/18/2014 1133   ALKPHOS 74 01/06/2015 1034   ALKPHOS 68 10/23/2013 1030   AST 39* 01/06/2015 1034   AST 25 10/23/2013 1030   ALT 50 01/06/2015 1034   ALT 16 10/23/2013 1030   BILITOT 0.26 01/06/2015 1034   BILITOT 0.7 10/23/2013 1030       No results found for: LABCA2  No components found for: LABCA125  No results for input(s): INR in the last 168 hours.  Urinalysis    Component Value Date/Time   COLORURINE YELLOW 12/23/2014 1335   APPEARANCEUR CLEAR 12/23/2014 1335   LABSPEC 1.007 12/23/2014 1335   PHURINE  6.0 12/23/2014 1335   GLUCOSEU NEGATIVE 12/23/2014 1335   HGBUR NEGATIVE 12/23/2014 1335   BILIRUBINUR NEGATIVE 12/23/2014 1335   BILIRUBINUR neg 06/18/2014 1103   KETONESUR NEGATIVE 12/23/2014 1335   PROTEINUR NEGATIVE 12/23/2014 1335   PROTEINUR neg 06/18/2014 1103   UROBILINOGEN 0.2 12/23/2014 1335   UROBILINOGEN 0.2 06/18/2014 1103   NITRITE NEGATIVE 12/23/2014 1335   NITRITE neg 06/18/2014 1103   LEUKOCYTESUR NEGATIVE 12/23/2014 1335    STUDIES: No results found.  ASSESSMENT: 59 y.o.  woman status post right breast biopsy 08/30/2014 for a clinical T2 N0, stage IIA invasive ductal carcinoma, grade 2, estrogen receptor2% "positive," progesterone receptor negative, HER-2 positive, with an MIB-1 of 81%.  (1) neoadjuvant chemotherapy will consist of carboplatin, docetaxel, trastuzumab and pertuzumab x 6  beginning 09/23/14  (a) docetaxel switched for gemcitabine for cycles 5 and 6 because of acute bilateral lower extremity edema  (2) trastuzumab will be continued to complete 1 year  (3) surgery will follow chemotherapy  (4) adjuvant radiation will follow surgery  (5) consider anti-estrogens after radiation  (6) genetics testing pending  PLAN: Coda looks and feels well today. The labs were reviewed in detail and were stable. Her platelets are up to 311, and her hgb is back up to 10.2. She will proceed with her 6th and final cycle of carboplatin, gemcitabine, trastuzumab, and pertuzumab as planned today. She has a repeat echocardiogram scheduled for Wednesday.   She is complaining of shooting pains to her bilateral thighs, and does not want to use her narcotics for this. We will try out 156m TID gabapentin and she will report back with the efficacy next week.  I have placed orders for a final breast MRI to be performed next week, and also a referral to Dr. BMarlowe Aschoffoffice for a surgical consult visit. The fact that her mass is no longer palpable is  encouraging.  SLutishawill return in 1 week for labs and a nadir visit. She understands and agrees with this plan. She knows the goal of treatment in her case is cure. She has been encouraged to call with any issues that might arise before her next visit here.    HLaurie Panda NP   01/06/2015 1:19 PM

## 2015-01-06 NOTE — Telephone Encounter (Signed)
Gave avs & calendar for August through October. Dr.Byerly schedule for 08/22 @ 3:30.

## 2015-01-06 NOTE — Progress Notes (Signed)
Per [facility] TIRWER:[id number];YPPJKDT Name:QD: PPIs (Examples: Protonix, Pantoprazole, Prilosec, Omeprazole, Prevacid, Lansoprazole, Nexium, Esomeprazole, Aciphex, Rabeprazole) - *SoNC*;Status:Approved;Coverage Start Date:10/23/2014;Coverage End Date:11/22/2015;

## 2015-01-06 NOTE — Patient Instructions (Addendum)
[facility] Discharge Instructions for Patients Receiving Chemotherapy  Today you received the following chemotherapy agents Gemzar, Carboplatin, Perjeta/Herceptin.    To help prevent nausea and vomiting after your treatment, we encourage you to take your nausea medication as directed.    If you develop nausea and vomiting that is not controlled by your nausea medication, call the clinic.   BELOW ARE SYMPTOMS THAT SHOULD BE REPORTED IMMEDIATELY:  *FEVER GREATER THAN 100.5 F  *CHILLS WITH OR WITHOUT FEVER  NAUSEA AND VOMITING THAT IS NOT CONTROLLED WITH YOUR NAUSEA MEDICATION  *UNUSUAL SHORTNESS OF BREATH  *UNUSUAL BRUISING OR BLEEDING  TENDERNESS IN MOUTH AND THROAT WITH OR WITHOUT PRESENCE OF ULCERS  *URINARY PROBLEMS  *BOWEL PROBLEMS  UNUSUAL RASH Items with * indicate a potential emergency and should be followed up as soon as possible.  Feel free to call the clinic you have any questions or concerns. The clinic phone number is [phone].  Please show the [facility] at check-in to the Emergency Department and triage nurse.

## 2015-01-08 ENCOUNTER — Encounter: Payer: Self-pay | Admitting: Oncology

## 2015-01-08 ENCOUNTER — Ambulatory Visit (HOSPITAL_COMMUNITY)
Admission: RE | Admit: 2015-01-08 | Discharge: 2015-01-08 | Disposition: A | Discharge status: Home / Self Care | Payer: BC Managed Care – PPO | Source: Ambulatory Visit | Attending: Nurse Practitioner | Admitting: Nurse Practitioner

## 2015-01-08 ENCOUNTER — Other Ambulatory Visit: Payer: Self-pay | Admitting: Nurse Practitioner

## 2015-01-08 DIAGNOSIS — C50211 Malignant neoplasm of upper-inner quadrant of right female breast: Secondary | ICD-10-CM | POA: Diagnosis not present

## 2015-01-08 DIAGNOSIS — I071 Rheumatic tricuspid insufficiency: Secondary | ICD-10-CM | POA: Insufficient documentation

## 2015-01-08 DIAGNOSIS — I252 Old myocardial infarction: Secondary | ICD-10-CM | POA: Diagnosis not present

## 2015-01-08 DIAGNOSIS — Z87891 Personal history of nicotine dependence: Secondary | ICD-10-CM | POA: Insufficient documentation

## 2015-01-08 NOTE — Progress Notes (Unsigned)
Left VM with information concerning the authorization of breast mri.

## 2015-01-08 NOTE — Progress Notes (Signed)
  Echocardiogram 2D Echocardiogram Limited has been performed.  Jennette Dubin 01/08/2015, 11:44 AM

## 2015-01-09 ENCOUNTER — Ambulatory Visit (HOSPITAL_COMMUNITY)
Admission: RE | Admit: 2015-01-09 | Discharge: 2015-01-09 | Disposition: A | Discharge status: Home / Self Care | Payer: BC Managed Care – PPO | Source: Ambulatory Visit | Attending: Nurse Practitioner | Admitting: Nurse Practitioner

## 2015-01-09 ENCOUNTER — Other Ambulatory Visit: Payer: Self-pay | Admitting: Nurse Practitioner

## 2015-01-09 DIAGNOSIS — C50211 Malignant neoplasm of upper-inner quadrant of right female breast: Secondary | ICD-10-CM | POA: Diagnosis present

## 2015-01-09 MED ORDER — GADOBENATE DIMEGLUMINE 529 MG/ML IV SOLN
15.0000 mL | Freq: Once | INTRAVENOUS | Status: AC | PRN
  Administered 2015-01-09: 11 mL via INTRAVENOUS

## 2015-01-10 ENCOUNTER — Telehealth: Payer: Self-pay

## 2015-01-10 NOTE — Telephone Encounter (Signed)
Pt called requesting MRI results, read the impression statement to the patient. I deferred any questions to the MD appt on Monday.

## 2015-01-13 ENCOUNTER — Other Ambulatory Visit (HOSPITAL_BASED_OUTPATIENT_CLINIC_OR_DEPARTMENT_OTHER): Payer: BC Managed Care – PPO

## 2015-01-13 ENCOUNTER — Encounter: Payer: Self-pay | Admitting: Nurse Practitioner

## 2015-01-13 ENCOUNTER — Other Ambulatory Visit: Payer: Self-pay | Admitting: General Surgery

## 2015-01-13 ENCOUNTER — Ambulatory Visit (HOSPITAL_BASED_OUTPATIENT_CLINIC_OR_DEPARTMENT_OTHER): Payer: BC Managed Care – PPO | Admitting: Nurse Practitioner

## 2015-01-13 VITALS — BP 109/66 | HR 89 | Temp 98.5°F | Resp 18 | Ht 62.0 in | Wt 119.5 lb

## 2015-01-13 DIAGNOSIS — C50811 Malignant neoplasm of overlapping sites of right female breast: Secondary | ICD-10-CM

## 2015-01-13 DIAGNOSIS — C50211 Malignant neoplasm of upper-inner quadrant of right female breast: Secondary | ICD-10-CM

## 2015-01-13 LAB — CBC WITH DIFFERENTIAL/PLATELET
BASO%: 0.8 % (ref 0.0–2.0)
Basophils Absolute: 0.1 10*3/uL (ref 0.0–0.1)
EOS%: 0.6 % (ref 0.0–7.0)
Eosinophils Absolute: 0.1 10*3/uL (ref 0.0–0.5)
HCT: 29.6 % — ABNORMAL LOW (ref 34.8–46.6)
HGB: 10.1 g/dL — ABNORMAL LOW (ref 11.6–15.9)
LYMPH%: 13.9 % — AB (ref 14.0–49.7)
MCH: 34.8 pg — ABNORMAL HIGH (ref 25.1–34.0)
MCHC: 34.1 g/dL (ref 31.5–36.0)
MCV: 102.1 fL — AB (ref 79.5–101.0)
MONO#: 0.7 10*3/uL (ref 0.1–0.9)
MONO%: 6.5 % (ref 0.0–14.0)
NEUT#: 8.3 10*3/uL — ABNORMAL HIGH (ref 1.5–6.5)
NEUT%: 78.2 % — ABNORMAL HIGH (ref 38.4–76.8)
Platelets: 121 10*3/uL — ABNORMAL LOW (ref 145–400)
RBC: 2.9 10*6/uL — AB (ref 3.70–5.45)
RDW: 17.3 % — ABNORMAL HIGH (ref 11.2–14.5)
WBC: 10.6 10*3/uL — ABNORMAL HIGH (ref 3.9–10.3)
lymph#: 1.5 10*3/uL (ref 0.9–3.3)

## 2015-01-13 LAB — COMPREHENSIVE METABOLIC PANEL (CC13)
ALT: 127 U/L — AB (ref 0–55)
ANION GAP: 10 meq/L (ref 3–11)
AST: 62 U/L — ABNORMAL HIGH (ref 5–34)
Albumin: 3.6 g/dL (ref 3.5–5.0)
Alkaline Phosphatase: 122 U/L (ref 40–150)
BUN: 10.4 mg/dL (ref 7.0–26.0)
CO2: 26 mEq/L (ref 22–29)
Calcium: 8.7 mg/dL (ref 8.4–10.4)
Chloride: 100 mEq/L (ref 98–109)
Creatinine: 0.7 mg/dL (ref 0.6–1.1)
EGFR: >90 mL/min/{1.73_m2} (ref >90)
Glucose: 92 mg/dl (ref 70–140)
Potassium: 3.7 mEq/L (ref 3.5–5.1)
Sodium: 137 mEq/L (ref 136–145)
Total Bilirubin: 0.26 mg/dL (ref 0.20–1.20)
Total Protein: 6.4 g/dL (ref 6.4–8.3)

## 2015-01-13 NOTE — Progress Notes (Signed)
[place]  [phone]) [phone] [phone]) [phone]     ID: Beth Rice DOB: [birth year]  MR#: [id number]  LTJ#:[id number]  Patient Care Team: Darlyne Russian, MD as PCP - General (Family Medicine) [name], MD as Consulting Physician (General Surgery) Chauncey Cruel, MD as Consulting Physician (Oncology) Eppie Gibson, MD as Attending Physician (Radiation Oncology) Rockwell Germany, RN as Registered Nurse Mauro Kaufmann, RN as Registered Nurse PCP: [name], MD OTHER MD: Jamie Kato MD  CHIEF COMPLAINT: HER-2 positive breast cancer  CURRENT TREATMENT: Neoadjuvant chemotherapy and anti-HER-2 immunotherapy  BREAST CANCER HISTORY: From the original intake note:  Beth Rice herself palpated a mass in her right breast but "didn't think much about it". She saw [name] for routine gynecologic follow-up and he also palpated and immediately set her up for right diagnostic mammography with tomosynthesis and ultrasonography at the [facility] 08/27/2014. In the upper outer quadrant of the right breast there was a spiculated mass accompanied by calcifications, the complex extending up to 2.5 cm. This was palpable and mobile. It measured approximately 2 cm by palpation. There was no palpable right axillary adenopathy. Ultrasound of the right axilla also showed normal axillary contents.  Ultrasound of the right breast confirmed an irregularly marginated hypoechoic mass measuring 1.8 cm. Biopsy was not immediately performed because of patient was on aspirin at the time. On 08/30/2014 Beth Rice underwent biopsy of the mass in question, with the pathology ([id number]) (showing an invasive ductal carcinoma, grade 2, estrogen receptor 2% positive with moderate staining intensity, progesterone receptor negative, with an MIB-1 of 81% and with HER-2 amplified, the signals ratio being 2.87 and the number per cell 4.30.  On 09/03/2024 she underwent bilateral breast MRI. This  found the breast composition to be category C. In the right breast there was a spiculated mass measuring 2.3 cm in close proximity to the pectoralis but without obvious invasion of the muscle. The rest of the breast, the left breast, and the lymph node areas well otherwise unremarkable.  Her subsequent history is as detailed below   INTERVAL HISTORY: Beth Rice returns for follow up of her breast cancer, accompanied by her sister and a friend. Today is day 8, cycle 6 of 6 planned cycles of of carboplatin, gemcitabine, trastuzumab and pertuzumab given every 3 weeks with Onpro support.  REVIEW OF SYSTEMS: Beth Rice denies fevers, chills, nausea, or vomiting. She had a fair amount of diarrhea this cycle, some from the chemo, and some from diet choices. She is using imodium with success. She is eating fairly well despite taste changes. Her leg pain is improving with gabapentin. She believes her chemo brain has been worse lately. She does not sleep as well. 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 A detailed review of systems is otherwise stable.  PAST MEDICAL HISTORY: Past Medical History  Diagnosis Date  . Anxiety   . Elevated cholesterol   . Depression   . Chest pain     a. 09/2008 Cath: ER 60%, LM nl, LAD 50m 459m, LCX nondom, nl, RCA dom, 2030mDA/PLA nl.  . Tobacco abuse   . H/O degenerative disc disease   . Myocardial infarction     "I think I had a heart attack in 2010"  . Headache     "frequency depends on the weather" (06/04/2014)  . Arthritis     "neck" (06/04/2014)  . Chronic lower back pain   .  Gastroesophageal reflux disease     H/O  . Hot flashes     PAST SURGICAL HISTORY: Past Surgical History  Procedure Laterality Date  . Ovarian cyst removal  1980's?  . Tonsillectomy    . Cardiac catheterization  10/15/2008    noncritical mid & distal third anterior descending disease  . Portacath placement Left 09/17/2014    Procedure: INSERTION  PORT-A-CATH;  Surgeon: [name], MD;  Location: [facility];  Service: General;  Laterality: Left;    FAMILY HISTORY Family History  Problem Relation Age of Onset  . Hypertension Sister   . Heart disease Mother   . Alzheimer's disease Mother   . Cerebral aneurysm Father   . Mental illness Father   . Cancer Maternal Aunt 45    ovarian  . Cancer Other 19    mat great aunt (through [facility]) with colorectal cancer  . Cancer Other 68    mat great uncle (through [facility]) with prostate cancer  . Cancer Cousin 16    paternal female first cousin through an uncle with pancreatic cancer  The patient's father died at the age of 94 after having cerebral hemorrhages and age 65 and 64. The patient's mother died at the age of 24 with Parkinson's and Alzheimer's disease. The patient's mother had one sister and this sister was diagnosed with ovarian cancer in her early 89s. There is no other history of breast or ovarian cancer in the family.   GYNECOLOGIC HISTORY:  No LMP recorded. Patient is postmenopausal.  menarche age 47, the patient is GX P0. She stopped having periods in 2005. She did not take hormone replacement.   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 She is in process of retiring, with targeted retirement date in June. She is single, lives alone with 4 dogs.    ADVANCED DIRECTIVES: Not in place. She tells me she plans to name her sister Beth Rice as her healthcare power of attorney. Beth Rice (who is a Marine scientist) can be reached in [place] at [phone] (cell) or [phone] (home).   HEALTH MAINTENANCE: Social History  Substance Use Topics  . Smoking status: Former Smoker -- 1.00 packs/day for 20 years    Types: Cigarettes    Quit date: 06/21/2014  . Smokeless tobacco: Never Used  . Alcohol Use: 1.8 oz/week    3 Cans of beer, 0 Standard drinks or equivalent per week     Colonoscopy:  2006/[facility]  ATF:TDDUK 2016   Bone density:09/10/[facility]/normal   Lipid panel:  Allergies  Allergen Reactions  . Penicillins Hives  . Statins     Patient has significant muscle cramps with statins. She cannot tolerate them.  . Sulfa Antibiotics Hives    Current Outpatient Prescriptions  Medication Sig Dispense Refill  . acyclovir (ZOVIRAX) 400 MG tablet Take 1 tablet (400 mg total) by mouth 2 (two) times daily. 60 tablet 3  . ALPRAZolam (XANAX) 0.5 MG tablet TAKE 1 TABLET BY MOUTH EVERY 6 HOURS AS NEEDED FOR MUSCLE SPASMS 30 tablet 2  . aspirin EC 81 MG tablet Take 81 mg by mouth daily.    Marland Kitchen b complex vitamins capsule Take 1 capsule by mouth at bedtime.     . Calcium Carbonate-Vitamin D (CALCIUM + D PO) Take 1 tablet by mouth at bedtime.     . cholestyramine (QUESTRAN) 4 G packet Take 1 packet (4 g total) by mouth 2 (two)  times daily. (Patient not taking: Reported on 12/23/2014) 60 each 1  . clonazePAM (KLONOPIN) 1 MG tablet Take 1 tablet (1 mg total) by mouth at bedtime. 30 tablet 0  . dexamethasone (DECADRON) 4 MG tablet Take 2 tablets (8 mg total) by mouth 2 (two) times daily. Start the day before Taxotere. Then again the day after chemo for 3 days. 30 tablet 1  . ezetimibe (ZETIA) 10 MG tablet Take 1 tablet (10 mg total) by mouth daily. 30 tablet 11  . fish oil-omega-3 fatty acids 1000 MG capsule Take 1 g by mouth at bedtime.     . fluconazole (DIFLUCAN) 100 MG tablet Take 1 tablet (100 mg total) by mouth daily. 10 tablet 3  . fluticasone (FLONASE) 50 MCG/ACT nasal spray Place 2 sprays into both nostrils daily. (Patient not taking: Reported on 11/25/2014) 16 g 11  . gabapentin (NEURONTIN) 100 MG capsule Take 1 capsule (100 mg total) by mouth 3 (three) times daily. Take 1-3 capsules up to three times daily 60 capsule 2  . levocetirizine (XYZAL) 5 MG tablet TAKE 1 TABLET (5 MG TOTAL) BY MOUTH EVERY EVENING. 30 tablet 3  . lidocaine-prilocaine (EMLA) cream  Apply to affected area once 30 g 3  . LORazepam (ATIVAN) 0.5 MG tablet Take 1 tablet (0.5 mg total) by mouth every 6 (six) hours as needed (Nausea or vomiting). 30 tablet 0  . Lysine 500 MG CAPS Take 500 mg by mouth at bedtime.     . metoCLOPramide (REGLAN) 5 MG tablet Take 1 tablet (5 mg total) by mouth 4 (four) times daily -  before meals and at bedtime. 20 tablet 1  . Multiple Vitamins-Minerals (MULTIVITAMIN WITH MINERALS) tablet Take 1 tablet by mouth at bedtime.     Marland Kitchen nystatin (MYCOSTATIN) 100000 UNIT/ML suspension Take 5 mLs (500,000 Units total) by mouth 4 (four) times daily. (Patient not taking: Reported on 01/06/2015) 240 mL 0  . omeprazole (PRILOSEC) 40 MG capsule Take 1 capsule (40 mg total) by mouth daily. 30 capsule 2  . ondansetron (ZOFRAN) 8 MG tablet Take 1 tablet (8 mg total) by mouth 2 (two) times daily. Start the day after chemo for 3 days. Then take as needed for nausea or vomiting. 30 tablet 1  . oxyCODONE-acetaminophen (ROXICET) 5-325 MG per tablet Take 1-2 tablets by mouth every 4 (four) hours as needed for severe pain. (Patient not taking: Reported on 11/11/2014) 30 tablet 0  . prochlorperazine (COMPAZINE) 10 MG tablet Take 1 tablet (10 mg total) by mouth every 6 (six) hours as needed (Nausea or vomiting). 30 tablet 1  . ranitidine (ZANTAC) 150 MG tablet TAKE 1 TABLET (150 MG TOTAL) BY MOUTH 2 (TWO) TIMES DAILY. 60 tablet 3  . Salicylic Acid 3 % SHAM Apply 1 application topically daily. (Patient not taking: Reported on 12/03/2014) 177 mL 0  . sertraline (ZOLOFT) 50 MG tablet Take 1 tablet (50 mg total) by mouth daily. 30 tablet 11   No current facility-administered medications for this visit.    OBJECTIVE: middle-aged white woman who appears stated age 59 Vitals:   01/13/15 1037  BP: 109/66  Pulse: 89  Temp: 98.5 F (36.9 C)  Resp: 18     Body mass index is 21.85 kg/(m^2).    ECOG FS:1 - Symptomatic but completely ambulatory  Skin: warm, dry  HEENT: sclerae  anicteric, conjunctivae pink, oropharynx clear. No thrush or mucositis.  Lymph Nodes: No cervical or supraclavicular lymphadenopathy  Lungs: clear to auscultation bilaterally, no  rales, wheezes, or rhonci  Heart: regular rate and rhythm  Abdomen: round, soft, non tender, positive bowel sounds  Musculoskeletal: No focal spinal tenderness, no peripheral edema  Neuro: non focal, well oriented, positive affect  Breast: right breast mass no longer palpable. Right axilla benign.    LAB RESULTS:  CMP     Component Value Date/Time   NA 142 01/06/2015 1034   NA 139 06/18/2014 1133   K 3.4* 01/06/2015 1034   K 4.2 06/18/2014 1133   CL 103 06/18/2014 1133   CO2 32* 01/06/2015 1034   CO2 27 06/18/2014 1133   GLUCOSE 123 01/06/2015 1034   GLUCOSE 93 06/18/2014 1133   BUN 5.2* 01/06/2015 1034   BUN 7 06/18/2014 1133   CREATININE 0.7 01/06/2015 1034   CREATININE 0.73 06/18/2014 1133   CREATININE 0.64 06/03/2014 1203   CALCIUM 8.7 01/06/2015 1034   CALCIUM 9.3 06/18/2014 1133   PROT 6.0* 01/06/2015 1034   PROT 6.8 10/23/2013 1030   ALBUMIN 3.4* 01/06/2015 1034   ALBUMIN 4.5 10/23/2013 1030   AST 39* 01/06/2015 1034   AST 25 10/23/2013 1030   ALT 50 01/06/2015 1034   ALT 16 10/23/2013 1030   ALKPHOS 74 01/06/2015 1034   ALKPHOS 68 10/23/2013 1030   BILITOT 0.26 01/06/2015 1034   BILITOT 0.7 10/23/2013 1030   GFRNONAA >90 06/03/2014 1203   GFRNONAA >89 10/23/2013 1030   GFRAA >90 06/03/2014 1203   GFRAA >89 10/23/2013 1030    INo results found for: SPEP, UPEP  Lab Results  Component Value Date   WBC 10.6* 01/13/2015   NEUTROABS 8.3* 01/13/2015   HGB 10.1* 01/13/2015   HCT 29.6* 01/13/2015   MCV 102.1* 01/13/2015   PLT 121* 01/13/2015      Chemistry      Component Value Date/Time   NA 142 01/06/2015 1034   NA 139 06/18/2014 1133   K 3.4* 01/06/2015 1034   K 4.2 06/18/2014 1133   CL 103 06/18/2014 1133   CO2 32* 01/06/2015 1034   CO2 27 06/18/2014 1133   BUN 5.2*  01/06/2015 1034   BUN 7 06/18/2014 1133   CREATININE 0.7 01/06/2015 1034   CREATININE 0.73 06/18/2014 1133   CREATININE 0.64 06/03/2014 1203      Component Value Date/Time   CALCIUM 8.7 01/06/2015 1034   CALCIUM 9.3 06/18/2014 1133   ALKPHOS 74 01/06/2015 1034   ALKPHOS 68 10/23/2013 1030   AST 39* 01/06/2015 1034   AST 25 10/23/2013 1030   ALT 50 01/06/2015 1034   ALT 16 10/23/2013 1030   BILITOT 0.26 01/06/2015 1034   BILITOT 0.7 10/23/2013 1030       No results found for: LABCA2  No components found for: LABCA125  No results for input(s): INR in the last 168 hours.  Urinalysis    Component Value Date/Time   COLORURINE YELLOW 12/23/2014 [place] 12/23/2014 1335   LABSPEC 1.007 12/23/2014 1335   PHURINE 6.0 12/23/2014 1335   GLUCOSEU NEGATIVE 12/23/2014 1335   HGBUR NEGATIVE 12/23/2014 1335   BILIRUBINUR NEGATIVE 12/23/2014 1335   BILIRUBINUR neg 06/18/2014 1103   KETONESUR NEGATIVE 12/23/2014 1335   PROTEINUR NEGATIVE 12/23/2014 1335   PROTEINUR neg 06/18/2014 1103   UROBILINOGEN 0.2 12/23/2014 1335   UROBILINOGEN 0.2 06/18/2014 1103   NITRITE NEGATIVE 12/23/2014 1335   NITRITE neg 06/18/2014 1103   LEUKOCYTESUR NEGATIVE 12/23/2014 1335    STUDIES: Mr Breast Bilateral W Wo Contrast  01/09/2015  CLINICAL DATA:  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 Followup examination to determine treatment response.  LABS:  Not applicable.  EXAM: BILATERAL BREAST MRI WITH AND WITHOUT CONTRAST  TECHNIQUE: Multiplanar, multisequence MR images of both breasts were obtained prior to and following the intravenous administration of 10 ml of MultiHance.  THREE-DIMENSIONAL MR IMAGE RENDERING ON INDEPENDENT WORKSTATION:  Three-dimensional MR images were rendered by post-processing of the original MR data on an  independent workstation. The three-dimensional MR images were interpreted, and findings are reported in the following complete MRI report for this study. Three dimensional images were evaluated at the independent DynaCad workstation.  COMPARISON:  Bilateral breast MRI 09/04/2014. Multiple prior mammograms. Right breast ultrasound 08/30/2014, 08/27/2014.  FINDINGS: Breast composition: c. Heterogeneous fibroglandular tissue.  Background parenchymal enhancement: Minimal.  Right breast: Minimal residual enhancement at the superior edge of the previously identified mass in the upper breast, posterior depth. This enhancement demonstrates benign progressive kinetics. No evidence of residual tumor otherwise. No new findings.  Left breast: No mass or abnormal enhancement.  Lymph nodes: No pathologic lymphadenopathy.  Ancillary findings: Port-A-Cath port in the left upper chest wall without complicating features.  IMPRESSION: 1. Essentially complete tumor response to neoadjuvant chemotherapy. Minimal residual enhancement at the superior margin of the biopsy clip in the upper right breast, posterior depth demonstrates benign progressive kinetics. 2. No evidence of malignancy elsewhere in either breast. 3. No pathologic lymphadenopathy.  RECOMMENDATION: Treatment plan.  BI-RADS CATEGORY  6: Known biopsy-proven malignancy.   Electronically Signed   By: Evangeline Dakin M.D.   On: 01/09/2015 11:04    ASSESSMENT: 59 y.o. [place] woman status post right breast biopsy 08/30/2014 for a clinical T2 N0, stage IIA invasive ductal carcinoma, grade 2, estrogen receptor2% "positive," progesterone receptor negative, HER-2 positive, with an MIB-1 of 81%.  (1) neoadjuvant chemotherapy will consist of carboplatin, docetaxel, trastuzumab and pertuzumab x 6  beginning 09/23/14  (a) docetaxel switched for gemcitabine for cycles 5 and 6 because of acute bilateral lower extremity edema  (2) trastuzumab will be continued to complete 1  year  (3) surgery will follow chemotherapy  (4) adjuvant radiation will follow surgery  (5) consider anti-estrogens after radiation  (6) genetics testing pending  PLAN: Zhara is excited to have completed chemotherapy. She is feeling well today. The labs were reviewed in detail and were stable. We reviewed the results of her breast MRI and it showed an complete tumor response to her treatments.   She has yet to have her consult visit with Dr. Barry Dienes, despite numerous orders placed, so I will call her office myself today.  Irine will continue trastuzumab every 3 weeks. She will have a follow up visit with Dr. Jana Hakim in mid September. She understands and agrees with this plan. She knows the goal of treatment in her case is cure. She has been encouraged to call with any issues that might arise before her next visit here.    Laurie Panda, NP   01/13/2015 10:58 AM

## 2015-01-14 ENCOUNTER — Other Ambulatory Visit: Payer: Self-pay

## 2015-01-14 NOTE — Telephone Encounter (Signed)
Rx for Klonopin faxed to [facility].

## 2015-01-15 ENCOUNTER — Other Ambulatory Visit: Payer: Self-pay | Admitting: Oncology

## 2015-01-15 ENCOUNTER — Other Ambulatory Visit: Payer: Self-pay | Admitting: General Surgery

## 2015-01-15 DIAGNOSIS — C50911 Malignant neoplasm of unspecified site of right female breast: Secondary | ICD-10-CM

## 2015-01-16 ENCOUNTER — Other Ambulatory Visit: Payer: Self-pay | Admitting: General Surgery

## 2015-01-16 ENCOUNTER — Encounter (HOSPITAL_BASED_OUTPATIENT_CLINIC_OR_DEPARTMENT_OTHER): Payer: Self-pay | Admitting: *Deleted

## 2015-01-16 DIAGNOSIS — C50911 Malignant neoplasm of unspecified site of right female breast: Secondary | ICD-10-CM

## 2015-01-16 NOTE — Progress Notes (Addendum)
Left message @ [facility] for Dr. Barry Dienes to review CBC prior to seed placement and surgery to determine need for further labs. Spoke with Dr. Barry Dienes. Does not feel pt needs additional lab studies to proceed with seed placement or surgery, unless anesthesia requires for surgery.

## 2015-01-17 ENCOUNTER — Ambulatory Visit
Admission: RE | Admit: 2015-01-17 | Discharge: 2015-01-17 | Disposition: A | Discharge status: Home / Self Care | Payer: BC Managed Care – PPO | Source: Ambulatory Visit | Attending: General Surgery | Admitting: General Surgery

## 2015-01-17 DIAGNOSIS — C50911 Malignant neoplasm of unspecified site of right female breast: Secondary | ICD-10-CM

## 2015-01-21 ENCOUNTER — Ambulatory Visit (HOSPITAL_BASED_OUTPATIENT_CLINIC_OR_DEPARTMENT_OTHER): Payer: BC Managed Care – PPO | Admitting: Anesthesiology

## 2015-01-21 ENCOUNTER — Ambulatory Visit
Admission: RE | Admit: 2015-01-21 | Discharge: 2015-01-21 | Disposition: A | Discharge status: Home / Self Care | Payer: BC Managed Care – PPO | Source: Ambulatory Visit | Attending: General Surgery | Admitting: General Surgery

## 2015-01-21 ENCOUNTER — Encounter (HOSPITAL_BASED_OUTPATIENT_CLINIC_OR_DEPARTMENT_OTHER): Payer: Self-pay | Admitting: Anesthesiology

## 2015-01-21 ENCOUNTER — Ambulatory Visit (HOSPITAL_BASED_OUTPATIENT_CLINIC_OR_DEPARTMENT_OTHER)
Admission: RE | Admit: 2015-01-21 | Discharge: 2015-01-21 | Disposition: A | Discharge status: Home / Self Care | Payer: BC Managed Care – PPO | Source: Ambulatory Visit | Attending: General Surgery | Admitting: General Surgery

## 2015-01-21 ENCOUNTER — Encounter (HOSPITAL_COMMUNITY)
Admission: RE | Admit: 2015-01-21 | Discharge: 2015-01-21 | Disposition: A | Discharge status: Home / Self Care | Payer: BC Managed Care – PPO | Source: Ambulatory Visit | Attending: General Surgery | Admitting: General Surgery

## 2015-01-21 ENCOUNTER — Encounter (HOSPITAL_BASED_OUTPATIENT_CLINIC_OR_DEPARTMENT_OTHER)
Admission: RE | Disposition: A | Discharge status: Home / Self Care | Payer: Self-pay | Source: Ambulatory Visit | Attending: General Surgery

## 2015-01-21 DIAGNOSIS — Z8249 Family history of ischemic heart disease and other diseases of the circulatory system: Secondary | ICD-10-CM | POA: Diagnosis not present

## 2015-01-21 DIAGNOSIS — E780 Pure hypercholesterolemia: Secondary | ICD-10-CM | POA: Diagnosis not present

## 2015-01-21 DIAGNOSIS — N6031 Fibrosclerosis of right breast: Secondary | ICD-10-CM | POA: Insufficient documentation

## 2015-01-21 DIAGNOSIS — Z8261 Family history of arthritis: Secondary | ICD-10-CM | POA: Insufficient documentation

## 2015-01-21 DIAGNOSIS — F419 Anxiety disorder, unspecified: Secondary | ICD-10-CM | POA: Insufficient documentation

## 2015-01-21 DIAGNOSIS — Z8041 Family history of malignant neoplasm of ovary: Secondary | ICD-10-CM | POA: Diagnosis not present

## 2015-01-21 DIAGNOSIS — Z86010 Personal history of colonic polyps: Secondary | ICD-10-CM | POA: Diagnosis not present

## 2015-01-21 DIAGNOSIS — Z9889 Other specified postprocedural states: Secondary | ICD-10-CM | POA: Insufficient documentation

## 2015-01-21 DIAGNOSIS — R921 Mammographic calcification found on diagnostic imaging of breast: Secondary | ICD-10-CM | POA: Diagnosis not present

## 2015-01-21 DIAGNOSIS — Z820 Family history of epilepsy and other diseases of the nervous system: Secondary | ICD-10-CM | POA: Diagnosis not present

## 2015-01-21 DIAGNOSIS — Z87891 Personal history of nicotine dependence: Secondary | ICD-10-CM | POA: Insufficient documentation

## 2015-01-21 DIAGNOSIS — F329 Major depressive disorder, single episode, unspecified: Secondary | ICD-10-CM | POA: Diagnosis not present

## 2015-01-21 DIAGNOSIS — Z800 Family history of malignant neoplasm of digestive organs: Secondary | ICD-10-CM | POA: Insufficient documentation

## 2015-01-21 DIAGNOSIS — Z170 Estrogen receptor positive status [ER+]: Secondary | ICD-10-CM | POA: Insufficient documentation

## 2015-01-21 DIAGNOSIS — C50911 Malignant neoplasm of unspecified site of right female breast: Secondary | ICD-10-CM

## 2015-01-21 DIAGNOSIS — D0511 Intraductal carcinoma in situ of right breast: Secondary | ICD-10-CM | POA: Diagnosis not present

## 2015-01-21 DIAGNOSIS — Z8489 Family history of other specified conditions: Secondary | ICD-10-CM | POA: Diagnosis not present

## 2015-01-21 DIAGNOSIS — M1990 Unspecified osteoarthritis, unspecified site: Secondary | ICD-10-CM | POA: Diagnosis not present

## 2015-01-21 HISTORY — DX: Malignant (primary) neoplasm, unspecified: C80.1

## 2015-01-21 HISTORY — PX: BREAST LUMPECTOMY WITH RADIOACTIVE SEED AND SENTINEL LYMPH NODE BIOPSY: SHX6550

## 2015-01-21 HISTORY — DX: Anemia, unspecified: D64.9

## 2015-01-21 SURGERY — BREAST LUMPECTOMY WITH RADIOACTIVE SEED AND SENTINEL LYMPH NODE BIOPSY
Anesthesia: General | Site: Breast | Laterality: Right

## 2015-01-21 MED ORDER — CIPROFLOXACIN IN D5W 400 MG/200ML IV SOLN: 400.0000 mg | INTRAVENOUS | Status: DC

## 2015-01-21 MED ORDER — SODIUM CHLORIDE 0.9 % IJ SOLN: 3.0000 mL | Freq: Two times a day (BID) | INTRAMUSCULAR | Status: DC

## 2015-01-21 MED ORDER — LACTATED RINGERS IV SOLN
INTRAVENOUS | Status: DC
  Administered 2015-01-21 (×2): via INTRAVENOUS

## 2015-01-21 MED ORDER — SODIUM CHLORIDE 0.9 % IV SOLN: 250.0000 mL | INTRAVENOUS | Status: DC | PRN

## 2015-01-21 MED ORDER — SCOPOLAMINE 1 MG/3DAYS TD PT72: 1.0000 | MEDICATED_PATCH | Freq: Once | TRANSDERMAL | Status: DC | PRN

## 2015-01-21 MED ORDER — MIDAZOLAM HCL 2 MG/2ML IJ SOLN
INTRAMUSCULAR | Status: AC
  Filled 2015-01-21: qty 2

## 2015-01-21 MED ORDER — DEXAMETHASONE SODIUM PHOSPHATE 4 MG/ML IJ SOLN
INTRAMUSCULAR | Status: DC | PRN
Start: 2015-01-21 — End: 2015-01-21
  Administered 2015-01-21: 10 mg via INTRAVENOUS

## 2015-01-21 MED ORDER — GLYCOPYRROLATE 0.2 MG/ML IJ SOLN: 0.2000 mg | Freq: Once | INTRAMUSCULAR | Status: DC | PRN

## 2015-01-21 MED ORDER — LIDOCAINE HCL (CARDIAC) 20 MG/ML IV SOLN
INTRAVENOUS | Status: DC | PRN
  Administered 2015-01-21: 75 mg via INTRAVENOUS

## 2015-01-21 MED ORDER — TECHNETIUM TC 99M SULFUR COLLOID FILTERED
1.0000 | Freq: Once | INTRAVENOUS | Status: AC | PRN
  Administered 2015-01-21: 1 via INTRADERMAL

## 2015-01-21 MED ORDER — BUPIVACAINE-EPINEPHRINE (PF) 0.5% -1:200000 IJ SOLN
INTRAMUSCULAR | Status: DC | PRN
  Administered 2015-01-21: 25 mL via PERINEURAL

## 2015-01-21 MED ORDER — PROPOFOL 10 MG/ML IV BOLUS
INTRAVENOUS | Status: DC | PRN
  Administered 2015-01-21: 100 mg via INTRAVENOUS

## 2015-01-21 MED ORDER — ONDANSETRON HCL 4 MG/2ML IJ SOLN
INTRAMUSCULAR | Status: DC | PRN
  Administered 2015-01-21: 4 mg via INTRAVENOUS

## 2015-01-21 MED ORDER — FENTANYL CITRATE (PF) 100 MCG/2ML IJ SOLN
25.0000 ug | INTRAMUSCULAR | Status: DC | PRN
  Administered 2015-01-21 (×2): 25 ug via INTRAVENOUS
  Administered 2015-01-21: 50 ug via INTRAVENOUS

## 2015-01-21 MED ORDER — BUPIVACAINE HCL (PF) 0.25 % IJ SOLN
INTRAMUSCULAR | Status: DC | PRN
  Administered 2015-01-21: 20 mL

## 2015-01-21 MED ORDER — SODIUM CHLORIDE 0.9 % IJ SOLN
INTRAMUSCULAR | Status: DC | PRN
  Administered 2015-01-21: 14:00:00

## 2015-01-21 MED ORDER — ACETAMINOPHEN 650 MG RE SUPP: 650.0000 mg | RECTAL | Status: DC | PRN

## 2015-01-21 MED ORDER — BUPIVACAINE HCL (PF) 0.25 % IJ SOLN
INTRAMUSCULAR | Status: AC
  Filled 2015-01-21: qty 30

## 2015-01-21 MED ORDER — SODIUM CHLORIDE 0.9 % IJ SOLN: 3.0000 mL | INTRAMUSCULAR | Status: DC | PRN

## 2015-01-21 MED ORDER — OXYCODONE-ACETAMINOPHEN 5-325 MG PO TABS: 1.0000 | ORAL_TABLET | ORAL | Status: DC | PRN

## 2015-01-21 MED ORDER — CIPROFLOXACIN IN D5W 400 MG/200ML IV SOLN
INTRAVENOUS | Status: AC
  Filled 2015-01-21: qty 200

## 2015-01-21 MED ORDER — FENTANYL CITRATE (PF) 100 MCG/2ML IJ SOLN
INTRAMUSCULAR | Status: AC
  Filled 2015-01-21: qty 6

## 2015-01-21 MED ORDER — ACETAMINOPHEN 325 MG PO TABS: 650.0000 mg | ORAL_TABLET | ORAL | Status: DC | PRN

## 2015-01-21 MED ORDER — FENTANYL CITRATE (PF) 100 MCG/2ML IJ SOLN
50.0000 ug | INTRAMUSCULAR | Status: AC | PRN
  Administered 2015-01-21: 100 ug via INTRAVENOUS
  Administered 2015-01-21 (×2): 50 ug via INTRAVENOUS

## 2015-01-21 MED ORDER — MIDAZOLAM HCL 2 MG/2ML IJ SOLN
1.0000 mg | INTRAMUSCULAR | Status: DC | PRN
  Administered 2015-01-21 (×2): 1 mg via INTRAVENOUS
  Administered 2015-01-21: 2 mg via INTRAVENOUS

## 2015-01-21 MED ORDER — FENTANYL CITRATE (PF) 100 MCG/2ML IJ SOLN
INTRAMUSCULAR | Status: AC
  Filled 2015-01-21: qty 2

## 2015-01-21 MED ORDER — OXYCODONE HCL 5 MG PO TABS: 5.0000 mg | ORAL_TABLET | ORAL | Status: DC | PRN

## 2015-01-21 MED ORDER — PROMETHAZINE HCL 25 MG/ML IJ SOLN: 6.2500 mg | INTRAMUSCULAR | Status: DC | PRN

## 2015-01-21 MED ORDER — CIPROFLOXACIN IN D5W 400 MG/200ML IV SOLN
INTRAVENOUS | Status: DC | PRN
  Administered 2015-01-21: 400 mg via INTRAVENOUS

## 2015-01-21 SURGICAL SUPPLY — 72 items
ADH SKN CLS APL DERMABOND .7 (GAUZE/BANDAGES/DRESSINGS) ×2
APPLIER CLIP 9.375 MED OPEN (MISCELLANEOUS)
BINDER BREAST LRG (GAUZE/BANDAGES/DRESSINGS) IMPLANT
BINDER BREAST MEDIUM (GAUZE/BANDAGES/DRESSINGS) ×4 IMPLANT
BINDER BREAST XLRG (GAUZE/BANDAGES/DRESSINGS) IMPLANT
BINDER BREAST XXLRG (GAUZE/BANDAGES/DRESSINGS) IMPLANT
BLADE HEX COATED 2.75 (ELECTRODE) ×4 IMPLANT
BLADE SURG 10 STRL SS (BLADE) ×4 IMPLANT
BLADE SURG 15 STRL LF DISP TIS (BLADE) ×2 IMPLANT
BLADE SURG 15 STRL SS (BLADE) ×2
BNDG COHESIVE 4X5 TAN STRL (GAUZE/BANDAGES/DRESSINGS) ×4 IMPLANT
CANISTER SUC SOCK COL 7IN (MISCELLANEOUS) IMPLANT
CANISTER SUCT 1200ML W/VALVE (MISCELLANEOUS) ×4 IMPLANT
CHLORAPREP W/TINT 26ML (MISCELLANEOUS) ×4 IMPLANT
CLIP APPLIE 9.375 MED OPEN (MISCELLANEOUS) IMPLANT
CLIP TI LARGE 6 (CLIP) ×4 IMPLANT
CLIP TI MEDIUM 6 (CLIP) ×4 IMPLANT
CLIP TI WIDE RED SMALL 6 (CLIP) IMPLANT
CLOSURE WOUND 1/2 X4 (GAUZE/BANDAGES/DRESSINGS) ×1
COVER BACK TABLE 60X90IN (DRAPES) ×8 IMPLANT
COVER MAYO STAND STRL (DRAPES) ×4 IMPLANT
COVER PROBE W GEL 5X96 (DRAPES) ×4 IMPLANT
DECANTER SPIKE VIAL GLASS SM (MISCELLANEOUS) IMPLANT
DERMABOND ADVANCED (GAUZE/BANDAGES/DRESSINGS) ×2
DERMABOND ADVANCED .7 DNX12 (GAUZE/BANDAGES/DRESSINGS) ×2 IMPLANT
DEVICE DUBIN W/COMP PLATE 8390 (MISCELLANEOUS) ×4 IMPLANT
DRAPE LAPAROTOMY 100X72 PEDS (DRAPES) ×4 IMPLANT
DRAPE UTILITY XL STRL (DRAPES) ×4 IMPLANT
DRSG PAD ABDOMINAL 8X10 ST (GAUZE/BANDAGES/DRESSINGS) ×4 IMPLANT
ELECT REM PT RETURN 9FT ADLT (ELECTROSURGICAL) ×4
ELECTRODE REM PT RTRN 9FT ADLT (ELECTROSURGICAL) ×2 IMPLANT
GLOVE BIO SURGEON STRL SZ 6 (GLOVE) ×4 IMPLANT
GLOVE BIO SURGEON STRL SZ 6.5 (GLOVE) ×6 IMPLANT
GLOVE BIO SURGEONS STRL SZ 6.5 (GLOVE) ×2
GLOVE BIOGEL PI IND STRL 6.5 (GLOVE) ×2 IMPLANT
GLOVE BIOGEL PI IND STRL 7.0 (GLOVE) ×2 IMPLANT
GLOVE BIOGEL PI INDICATOR 6.5 (GLOVE) ×2
GLOVE BIOGEL PI INDICATOR 7.0 (GLOVE) ×2
GOWN STRL REUS W/ TWL LRG LVL3 (GOWN DISPOSABLE) ×2 IMPLANT
GOWN STRL REUS W/TWL 2XL LVL3 (GOWN DISPOSABLE) ×4 IMPLANT
GOWN STRL REUS W/TWL LRG LVL3 (GOWN DISPOSABLE) ×4
KIT MARKER MARGIN INK (KITS) ×4 IMPLANT
LIQUID BAND (GAUZE/BANDAGES/DRESSINGS) ×4 IMPLANT
NDL SAFETY ECLIPSE 18X1.5 (NEEDLE) IMPLANT
NEEDLE HYPO 18GX1.5 SHARP (NEEDLE)
NEEDLE HYPO 25X1 1.5 SAFETY (NEEDLE) ×4 IMPLANT
NS IRRIG 1000ML POUR BTL (IV SOLUTION) ×4 IMPLANT
PACK BASIN DAY SURGERY FS (CUSTOM PROCEDURE TRAY) ×4 IMPLANT
PACK UNIVERSAL I (CUSTOM PROCEDURE TRAY) ×4 IMPLANT
PENCIL BUTTON HOLSTER BLD 10FT (ELECTRODE) ×4 IMPLANT
SHEET MEDIUM DRAPE 40X70 STRL (DRAPES) ×4 IMPLANT
SLEEVE SCD COMPRESS KNEE MED (MISCELLANEOUS) ×4 IMPLANT
SPONGE GAUZE 4X4 12PLY STER LF (GAUZE/BANDAGES/DRESSINGS) ×4 IMPLANT
SPONGE LAP 18X18 X RAY DECT (DISPOSABLE) ×4 IMPLANT
STAPLER VISISTAT 35W (STAPLE) IMPLANT
STOCKINETTE IMPERVIOUS LG (DRAPES) ×4 IMPLANT
STRIP CLOSURE SKIN 1/2X4 (GAUZE/BANDAGES/DRESSINGS) ×3 IMPLANT
SUT ETHILON 2 0 FS 18 (SUTURE) IMPLANT
SUT MNCRL AB 4-0 PS2 18 (SUTURE) ×4 IMPLANT
SUT MON AB 5-0 PS2 18 (SUTURE) IMPLANT
SUT SILK 2 0 SH (SUTURE) IMPLANT
SUT VIC AB 2-0 SH 27 (SUTURE) ×4
SUT VIC AB 2-0 SH 27XBRD (SUTURE) ×2 IMPLANT
SUT VIC AB 3-0 SH 27 (SUTURE) ×4
SUT VIC AB 3-0 SH 27X BRD (SUTURE) ×2 IMPLANT
SUT VIC AB 5-0 PS2 18 (SUTURE) IMPLANT
SYR CONTROL 10ML LL (SYRINGE) ×4 IMPLANT
TOWEL OR 17X24 6PK STRL BLUE (TOWEL DISPOSABLE) ×4 IMPLANT
TOWEL OR NON WOVEN STRL DISP B (DISPOSABLE) ×4 IMPLANT
TUBE CONNECTING 20'X1/4 (TUBING) ×1
TUBE CONNECTING 20X1/4 (TUBING) ×3 IMPLANT
YANKAUER SUCT BULB TIP NO VENT (SUCTIONS) IMPLANT

## 2015-01-21 NOTE — Transfer of Care (Signed)
Immediate Anesthesia Transfer of Care Note  Patient: Beth Rice  Procedure(s) Performed: Procedure(s): BREAST LUMPECTOMY WITH RADIOACTIVE SEED AND SENTINEL LYMPH NODE BIOPSY (Right)  Patient Location: PACU  Anesthesia Type:General and Regional  Level of Consciousness: awake, alert  and oriented  Airway & Oxygen Therapy: Patient Spontanous Breathing and Patient connected to face mask oxygen  Post-op Assessment: Report given to RN and Post -op Vital signs reviewed and stable  Post vital signs: Reviewed and stable  Last Vitals:  Filed Vitals:   01/21/15 1305  BP:   Pulse: 103  Temp:   Resp: 17    Complications: No apparent anesthesia complications

## 2015-01-21 NOTE — Discharge Instructions (Addendum)
[facility] °Office Phone Number [phone] ° °BREAST BIOPSY/ PARTIAL MASTECTOMY: POST OP INSTRUCTIONS ° °Always review your discharge instruction sheet given to you by the facility where your surgery was performed. ° °IF YOU HAVE DISABILITY OR FAMILY LEAVE FORMS, YOU MUST BRING THEM TO THE OFFICE FOR PROCESSING.  DO NOT GIVE THEM TO YOUR DOCTOR. ° °1. A prescription for pain medication may be given to you upon discharge.  Take your pain medication as prescribed, if needed.  If narcotic pain medicine is not needed, then you may take acetaminophen (Tylenol) or ibuprofen (Advil) as needed. °2. Take your usually prescribed medications unless otherwise directed °3. If you need a refill on your pain medication, please contact your pharmacy.  They will contact our office to request authorization.  Prescriptions will not be filled after 5pm or on week-ends. °4. You should eat very light the first 24 hours after surgery, such as soup, crackers, pudding, etc.  Resume your normal diet the day after surgery. °5. Most patients will experience some swelling and bruising in the breast.  Ice packs and a good support bra will help.  Swelling and bruising can take several days to resolve.  °6. It is common to experience some constipation if taking pain medication after surgery.  Increasing fluid intake and taking a stool softener will usually help or prevent this problem from occurring.  A mild laxative (Milk of Magnesia or Miralax) should be taken according to package directions if there are no bowel movements after 48 hours. °7. Unless discharge instructions indicate otherwise, you may remove your bandages 48 hours after surgery, and you may shower at that time.  You may have steri-strips (small skin tapes) in place directly over the incision.  These strips should be left on the skin for 7-10 days.   Any sutures or staples will be removed at the office during your follow-up visit. °8. ACTIVITIES:  You may resume  regular daily activities (gradually increasing) beginning the next day.  Wearing a good support bra or sports bra (or the breast binder) minimizes pain and swelling.  You may have sexual intercourse when it is comfortable. °a. You may drive when you no longer are taking prescription pain medication, you can comfortably wear a seatbelt, and you can safely maneuver your car and apply brakes. °b. RETURN TO WORK:  __________1 week_______________ °9. You should see your doctor in the office for a follow-up appointment approximately two weeks after your surgery.  Your doctor’s nurse will typically make your follow-up appointment when she calls you with your pathology report.  Expect your pathology report 2-3 business days after your surgery.  You may call to check if you do not hear from us after three days. ° ° °WHEN TO CALL YOUR DOCTOR: °1. Fever over 101.0 °2. Nausea and/or vomiting. °3. Extreme swelling or bruising. °4. Continued bleeding from incision. °5. Increased pain, redness, or drainage from the incision. ° °The clinic staff is available to answer your questions during regular business hours.  Please don’t hesitate to call and ask to speak to one of the nurses for clinical concerns.  If you have a medical emergency, go to the nearest emergency room or call 911.  A surgeon from [facility] is always on call at the hospital. ° °For further questions, please visit [email or web address]  ° ° °Post Anesthesia Home Care Instructions ° °Activity: °Get plenty of rest for the remainder of the day. A responsible adult should stay with you for 24   hours following the procedure.  °For the next 24 hours, DO NOT: °-Drive a car °-Operate machinery °-Drink alcoholic beverages °-Take any medication unless instructed by your physician °-Make any legal decisions or sign important papers. ° °Meals: °Start with liquid foods such as gelatin or soup. Progress to regular foods as tolerated. Avoid greasy, spicy, heavy  foods. If nausea and/or vomiting occur, drink only clear liquids until the nausea and/or vomiting subsides. Call your physician if vomiting continues. ° °Special Instructions/Symptoms: °Your throat may feel dry or sore from the anesthesia or the breathing tube placed in your throat during surgery. If this causes discomfort, gargle with warm salt water. The discomfort should disappear within 24 hours. ° °If you had a scopolamine patch placed behind your ear for the management of post- operative nausea and/or vomiting: ° °1. The medication in the patch is effective for 72 hours, after which it should be removed.  Wrap patch in a tissue and discard in the trash. Wash hands thoroughly with soap and water. °2. You may remove the patch earlier than 72 hours if you experience unpleasant side effects which may include dry mouth, dizziness or visual disturbances. °3. Avoid touching the patch. Wash your hands with soap and water after contact with the patch. °  ° °

## 2015-01-21 NOTE — Op Note (Signed)
Right Breast Radioactive seed localized lumpectomy and sentinel lymph node biopsy  Indications: This patient presents with history of right breast cancer, upper inner quadrant, cT2N0  Pre-operative Diagnosis: see above  Post-operative Diagnosis: Same  Surgeon: [name]   Anesthesia: General endotracheal anesthesia  ASA Class: 2  Procedure Details  The patient was seen in the Holding Room. The risks, benefits, complications, treatment options, and expected outcomes were discussed with the patient. The possibilities of bleeding, infection, the need for additional procedures, failure to diagnose a condition, and creating a complication requiring transfusion or operation were discussed with the patient. The patient concurred with the proposed plan, giving informed consent.  The site of surgery properly noted/marked. The patient was taken to Operating Room # 8, identified, and the procedure verified as right Breast seed localized Lumpectomy with sentinel lymph node biopsy. A Time Out was held and the above information confirmed.  The right breast and chest were prepped and draped in standard fashion. The lumpectomy was performed by creating an transverse incision over the upper inner quadrant of the breast over the previously placed radioactive seed.  Dissection was carried down to around the point of maximum signal intensity. The cautery was used to perform the dissection.  Hemostasis was achieved with cautery. The edges of the cavity were marked with large clips, with one each medial, lateral, inferior and superior, and two clips posteriorly.   The specimen was inked with the margin marker paint kit.    Specimen radiography confirmed inclusion of the mammographic lesion, the clip, and the seed.  The background signal in the breast was zero.  The wound was irrigated and closed with 3-0 vicryl in layers and 4-0 monocryl subcuticular suture.    Using a hand-held gamma probe, right axillary sentinel  nodes were identified transcutaneously.  An oblique incision was created below the axillary hairline.  Dissection was carried through the clavipectoral fascia.  Two level 2 axillary sentinel nodes were removed.  Counts per second were 750 and 25.    The background count was 0 cps.  The wound was irrigated.  Hemostasis was achieved with cautery.  The axillary incision was closed with a 3-0 vicryl deep dermal interrupted sutures and a 4-0 monocryl subcuticular closure.    Sterile dressings were applied. At the end of the operation, all sponge, instrument, and needle counts were correct.  Findings: grossly clear surgical margins and no adenopathy, posterior margin is pectoralis, anterior margin is skin.    Estimated Blood Loss:  min         Specimens: right breast lumpectomy and two axillary sentinel lymph nodes.             Complications:  None; patient tolerated the procedure well.         Disposition: PACU - hemodynamically stable.         Condition: stable

## 2015-01-21 NOTE — Anesthesia Postprocedure Evaluation (Addendum)
  Anesthesia Post-op Note  Patient: Beth Rice  Procedure(s) Performed: Procedure(s) (LRB): BREAST LUMPECTOMY WITH RADIOACTIVE SEED AND SENTINEL LYMPH NODE BIOPSY (Right)  Patient Location: PACU  Anesthesia Type: General  Level of Consciousness: awake and alert   Airway and Oxygen Therapy: Patient Spontanous Breathing  Post-op Pain: mild  Post-op Assessment: Post-op Vital signs reviewed, Patient's Cardiovascular Status Stable, Respiratory Function Stable, Patent Airway and No signs of Nausea or vomiting  Last Vitals:  Filed Vitals:   01/21/15 1605  BP:   Pulse: 89  Temp:   Resp: 13   BP 123/68 Post-op Vital Signs: stable   Complications: No apparent anesthesia complications

## 2015-01-21 NOTE — Anesthesia Procedure Notes (Addendum)
Anesthesia Regional Block:  Pectoralis block  Pre-Anesthetic Checklist: ,, timeout performed, Correct Patient, Correct Site, Correct Laterality, Correct Procedure, Correct Position, site marked, Risks and benefits discussed,  Surgical consent,  Pre-op evaluation,  At surgeon's request and post-op pain management  Laterality: Right  Prep: chloraprep       Needles:  Injection technique: Single-shot  Needle Type: Stimiplex          Additional Needles:  Procedures: ultrasound guided (picture in chart) Pectoralis block Narrative:  Injection made incrementally with aspirations every 5 mL.  Performed by: Personally  Anesthesiologist: JUDD, BENJAMIN  Additional Notes: Risks, benefits and alternative to block explained extensively.  Patient tolerated procedure well, without complications.  68ml 0.5% bupiv w/ epi   Procedure Name: LMA Insertion Date/Time: 01/21/2015 1:48 PM Performed by: Melynda Ripple D Pre-anesthesia Checklist: Patient identified, Emergency Drugs available, Suction available and Patient being monitored Patient Re-evaluated:Patient Re-evaluated prior to inductionOxygen Delivery Method: Circle System Utilized Preoxygenation: Pre-oxygenation with 100% oxygen Intubation Type: IV induction Ventilation: Mask ventilation without difficulty LMA: LMA inserted LMA Size: 4.0 Number of attempts: 1 Airway Equipment and Method: Bite block Placement Confirmation: positive ETCO2 Tube secured with: Tape Dental Injury: Teeth and Oropharynx as per pre-operative assessment

## 2015-01-21 NOTE — H&P (Signed)
Beth Rice Location: [facility] Patient #: [id number] DOB: 03/10/1956 Undefined / Language: Undefined / Race: Undefined Female  History of Present Illness The patient is a 59 year old female who presents with breast cancer. Patient is referred by [name] for consultation with a new diagnosis of right breast cancer. She presented with a palpable mass around 4 weeks ago. She subsequently underwent diagnostic mammogram and ultrasound. She was seen to have an area of calcifications approximately 2.5 cm in the upper inner quadrant of the right breast. On ultrasound, this area was 1.8 cm and it appeared to be an irregular mass. 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 There are 2 ratio is 2.7 and Ki-67 was 81%. She subsequently underwent an MRI which demonstrated 2.3 x 2.3 x 2.1 cm mass in the posterior aspect of the right upper breast. This appeared adjacent to the pectoralis major but not invading the pectoralis major. She does not have a history of cancer in her family. She herself has not had any diagnoses of cancer. She is a former smoker. She quit in January of this year. Since approximately 8 beers per week. She does not use illicit drugs. She is due for a colonoscopy this year but did have one in 2006. She also has had a bone density study and a Pap smear recently. She had menarche at age 31. She last had menses in 2005. She has not used hormone replacement or hormonal contraception. She is nulliparous.   She has been receiving neoadjuvant chemotherapy with Dr. Jana Hakim with carboplatin, trastuzumab and pertuzumab.  She has had a good response to chemo clinically as well as on MRI.   PMH Anxiety Disorder Arthritis Back Pain Breast Cancer Chest pain Depression Hypercholesterolemia Lump In Breast  Past Surgical History  Breast Biopsy  Right. Colon Polyp Removal - Colonoscopy Oral Surgery Tonsillectomy  Diagnostic Studies History Colonoscopy >10 years ago Pap Smear 1-5 years ago  Social History Alcohol use Moderate alcohol use. Caffeine use Coffee. No drug use Tobacco use Former smoker.  Family History Arthritis Family Members In General, Mother. Colon Cancer Family Members In General. Colon Polyps Sister. Heart Disease Mother. Hypertension Sister. Ovarian Cancer Family Members In General. Seizure disorder Father. Thyroid problems Family Members In General.  Pregnancy / Birth History Age at menarche 25 years. Age of menopause 55-55 Gravida 0 Irregular periods Para 0  Review of Systems  General Present- Appetite Loss, Fatigue, Night Sweats and Weight Loss. Not Present- Chills, Fever and Weight Gain. Skin Present- Dryness. Not Present- Change in Wart/Mole, Hives, Jaundice, New Lesions, Non-Healing Wounds, Rash and Ulcer. HEENT Present- Seasonal Allergies and Wears glasses/contact lenses. Not Present- Earache, Hearing Loss, Hoarseness, Nose Bleed, Oral Ulcers, Ringing in the Ears, Sinus Pain, Sore Throat, Visual Disturbances and Yellow Eyes. Respiratory Present- Snoring. Not Present- Bloody sputum, Chronic Cough, Difficulty Breathing and Wheezing. Breast Present- Breast Mass and Breast Pain. Not Present- Nipple Discharge and Skin Changes. Cardiovascular Not Present- Chest Pain, Difficulty Breathing Lying Down, Leg Cramps, Palpitations, Rapid Heart Rate, Shortness of Breath and Swelling of Extremities. Gastrointestinal Not Present- Abdominal Pain, Bloating, Bloody Stool, Change in Bowel Habits, Chronic diarrhea, Constipation, Difficulty Swallowing, Excessive gas, Gets full quickly at meals, Hemorrhoids, Indigestion, Nausea, Rectal Pain and Vomiting. Female Genitourinary Not Present- Frequency, Nocturia, Painful Urination, Pelvic Pain and Urgency. Musculoskeletal Present- Back Pain and  Joint Pain. Not Present- Joint Stiffness, Muscle  Pain, Muscle Weakness and Swelling of Extremities. Neurological Present- Trouble walking. Not Present- Decreased Memory, Fainting, Headaches, Numbness, Seizures, Tingling, Tremor and Weakness. Psychiatric Present- Anxiety, Change in Sleep Pattern and Depression. Not Present- Bipolar, Fearful and Frequent crying. Endocrine Present- Cold Intolerance and Hot flashes. Not Present- Excessive Hunger, Hair Changes, Heat Intolerance and New Diabetes. Hematology Not Present- Easy Bruising, Excessive bleeding, Gland problems, HIV and Persistent Infections.   Physical Exam General Mental Status-Alert. General Appearance-Consistent with stated age. Hydration-Well hydrated. Voice-Normal.  Head and Neck Head-normocephalic, atraumatic with no lesions or palpable masses. Trachea-midline. Thyroid Gland Characteristics - normal size and consistency.  Eye Eyeball - Bilateral-Extraocular movements intact. Sclera/Conjunctiva - Bilateral-No scleral icterus.  Chest and Lung Exam Chest and lung exam reveals -quiet, even and easy respiratory effort with no use of accessory muscles and on auscultation, normal breath sounds, no adventitious sounds and normal vocal resonance. Inspection Chest Wall - Normal. Back - normal.  Breast Note: The patient has a palpable mass in the upper central right breast. This is mobile. It is visible with a change in contour with the patient seated and lying. There is no palpable lymphadenopathy. She has no nipple retraction or skin dimpling. There is no nipple discharge.   Cardiovascular Cardiovascular examination reveals -normal heart sounds, regular rate and rhythm with no murmurs and normal pedal pulses bilaterally.  Abdomen Inspection Inspection of the abdomen reveals - No Hernias. Palpation/Percussion Palpation and Percussion of the abdomen reveal - Soft, Non Tender, No Rebound tenderness, No  Rigidity (guarding) and No hepatosplenomegaly. Auscultation Auscultation of the abdomen reveals - Bowel sounds normal.  Neurologic Neurologic evaluation reveals -alert and oriented x 3 with no impairment of recent or remote memory. Mental Status-Normal.  Musculoskeletal Global Assessment -Note: no gross deformities.  Normal Exam - Left-Upper Extremity Strength Normal and Lower Extremity Strength Normal. Normal Exam - Right-Upper Extremity Strength Normal and Lower Extremity Strength Normal.  Lymphatic Head & Neck  General Head & Neck Lymphatics: Bilateral - Description - Normal. Axillary  General Axillary Region: Bilateral - Description - Normal. Tenderness - Non Tender. Femoral & Inguinal  Generalized Femoral & Inguinal Lymphatics: Bilateral - Description - No Generalized lymphadenopathy.    Assessment & Plan  CARCINOMA OF RIGHT BREAST UPPER INNER QUADRANT (174.2  C50.211) Impression: Patient appears to have a T2 N0 M0 right breast cancer. The content of the Herceptin positivity and essentially hormone-negative tumor, she is a good candidate for neoadjuvant chemotherapy. I think this will maximize our chances for a negative margin on the pectoralis muscle. Also, she is much less likely to be lymph node positive following neoadjuvant chemotherapy.  I discussed the importance of remaining off tobacco. There there is a much lower risk of infection and wound healing issues when patients have stopped tobacco use.  The surgical procedure was described to the patient.  I discussed the incision type and location and that we would need radiology involved on with a wire or seed marker and/or sentinel node.      The risks and benefits of the procedure were described to the patient and she wishes to proceed.    We discussed the risks bleeding, infection, damage to other structures, need for further procedures/surgeries.  We discussed the risk of seroma.  The patient was advised  that we may need to go back to surgery for additional tissue to obtain negative margins or for a lymph node biopsy. The patient was advised that these are the most common complications, but that others  can occur as well.  They were advised against taking aspirin or other anti-inflammatory agents/blood thinners the week before surgery.

## 2015-01-21 NOTE — Anesthesia Preprocedure Evaluation (Signed)
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, no intervention, questionable MI per patient) Rhythm:Regular Rate:Normal     Neuro/Psych Anxiety    GI/Hepatic GERD-  Medicated and Controlled,  Endo/Other    Renal/GU      Musculoskeletal  (+) Arthritis -,   Abdominal   Peds  Hematology   Anesthesia Other Findings   Reproductive/Obstetrics                             Anesthesia Physical  Anesthesia Plan  ASA: II  Anesthesia Plan: General   Post-op Pain Management: GA combined w/ Regional for post-op pain   Induction: Intravenous  Airway Management Planned: LMA  Additional Equipment:   Intra-op Plan:   Post-operative Plan: Extubation in OR  Informed Consent: I have reviewed the patients History and Physical, chart, labs and discussed the procedure including the risks, benefits and alternatives for the proposed anesthesia with the patient or authorized representative who has indicated his/her understanding and acceptance.     Plan Discussed with: CRNA, Anesthesiologist and Surgeon  Anesthesia Plan Comments: (PECS block + LMA)        Anesthesia Quick Evaluation

## 2015-01-21 NOTE — Progress Notes (Signed)
Assisted Dr. Jillyn Hidden with right, ultrasound guided, pectoralis block. Side rails up, monitors on throughout procedure. See vital signs in flow sheet. Tolerated Procedure well.

## 2015-01-22 ENCOUNTER — Encounter (HOSPITAL_BASED_OUTPATIENT_CLINIC_OR_DEPARTMENT_OTHER): Payer: Self-pay | Admitting: General Surgery

## 2015-01-23 NOTE — Progress Notes (Signed)
Quick Note:  Please let patient know that margins are negative and LN are negative. All cancer went away with chemotherapy! ______

## 2015-01-27 ENCOUNTER — Other Ambulatory Visit: Payer: Self-pay | Admitting: Oncology

## 2015-01-27 ENCOUNTER — Encounter: Payer: Self-pay | Admitting: *Deleted

## 2015-01-27 ENCOUNTER — Ambulatory Visit (HOSPITAL_BASED_OUTPATIENT_CLINIC_OR_DEPARTMENT_OTHER): Payer: BC Managed Care – PPO

## 2015-01-27 ENCOUNTER — Other Ambulatory Visit (HOSPITAL_BASED_OUTPATIENT_CLINIC_OR_DEPARTMENT_OTHER): Payer: BC Managed Care – PPO

## 2015-01-27 VITALS — BP 103/61 | HR 77 | Temp 99.0°F | Resp 18

## 2015-01-27 DIAGNOSIS — C50811 Malignant neoplasm of overlapping sites of right female breast: Secondary | ICD-10-CM

## 2015-01-27 DIAGNOSIS — Z5112 Encounter for antineoplastic immunotherapy: Secondary | ICD-10-CM | POA: Diagnosis not present

## 2015-01-27 DIAGNOSIS — C50211 Malignant neoplasm of upper-inner quadrant of right female breast: Secondary | ICD-10-CM

## 2015-01-27 LAB — CBC WITH DIFFERENTIAL/PLATELET
BASO%: 0.5 % (ref 0.0–2.0)
Basophils Absolute: 0 10*3/uL (ref 0.0–0.1)
EOS%: 2.3 % (ref 0.0–7.0)
Eosinophils Absolute: 0.2 10*3/uL (ref 0.0–0.5)
HCT: 29 % — ABNORMAL LOW (ref 34.8–46.6)
HEMOGLOBIN: 9.6 g/dL — AB (ref 11.6–15.9)
LYMPH#: 1.6 10*3/uL (ref 0.9–3.3)
LYMPH%: 24.8 % (ref 14.0–49.7)
MCH: 34.4 pg — ABNORMAL HIGH (ref 25.1–34.0)
MCHC: 33.1 g/dL (ref 31.5–36.0)
MCV: 103.9 fL — AB (ref 79.5–101.0)
MONO#: 0.9 10*3/uL (ref 0.1–0.9)
MONO%: 14.6 % — AB (ref 0.0–14.0)
NEUT%: 57.8 % (ref 38.4–76.8)
NEUTROS ABS: 3.7 10*3/uL (ref 1.5–6.5)
NRBC: 0 % (ref 0–0)
Platelets: 234 10*3/uL (ref 145–400)
RBC: 2.79 10*6/uL — AB (ref 3.70–5.45)
RDW: 16.6 % — AB (ref 11.2–14.5)
WBC: 6.5 10*3/uL (ref 3.9–10.3)

## 2015-01-27 LAB — COMPREHENSIVE METABOLIC PANEL (CC13)
ALBUMIN: 3.4 g/dL — AB (ref 3.5–5.0)
ALT: 25 U/L (ref 0–55)
AST: 26 U/L (ref 5–34)
Alkaline Phosphatase: 87 U/L (ref 40–150)
Anion Gap: 6 mEq/L (ref 3–11)
BILIRUBIN TOTAL: 0.24 mg/dL (ref 0.20–1.20)
BUN: 7.6 mg/dL (ref 7.0–26.0)
CO2: 30 meq/L — AB (ref 22–29)
Calcium: 8.8 mg/dL (ref 8.4–10.4)
Chloride: 106 mEq/L (ref 98–109)
Creatinine: 0.7 mg/dL (ref 0.6–1.1)
GLUCOSE: 85 mg/dL (ref 70–140)
Potassium: 3.9 mEq/L (ref 3.5–5.1)
SODIUM: 141 meq/L (ref 136–145)
TOTAL PROTEIN: 6.3 g/dL — AB (ref 6.4–8.3)

## 2015-01-27 MED ORDER — HEPARIN SOD (PORK) LOCK FLUSH 100 UNIT/ML IV SOLN
500.0000 [IU] | Freq: Once | INTRAVENOUS | Status: AC | PRN
  Administered 2015-01-27: 500 [IU]
  Filled 2015-01-27: qty 5

## 2015-01-27 MED ORDER — SODIUM CHLORIDE 0.9 % IJ SOLN
10.0000 mL | INTRAMUSCULAR | Status: DC | PRN
  Administered 2015-01-27: 10 mL
  Filled 2015-01-27: qty 10

## 2015-01-27 MED ORDER — TRASTUZUMAB CHEMO INJECTION 440 MG
6.0000 mg/kg | Freq: Once | INTRAVENOUS | Status: AC
  Administered 2015-01-27: 315 mg via INTRAVENOUS
  Filled 2015-01-27: qty 15

## 2015-01-27 MED ORDER — SODIUM CHLORIDE 0.9 % IV SOLN
Freq: Once | INTRAVENOUS | Status: AC
  Administered 2015-01-27: 11:00:00 via INTRAVENOUS

## 2015-01-27 MED ORDER — DIPHENHYDRAMINE HCL 25 MG PO CAPS
25.0000 mg | ORAL_CAPSULE | Freq: Once | ORAL | Status: AC
  Administered 2015-01-27: 25 mg via ORAL

## 2015-01-27 MED ORDER — ACETAMINOPHEN 325 MG PO TABS
ORAL_TABLET | ORAL | Status: AC
  Filled 2015-01-27: qty 2

## 2015-01-27 MED ORDER — DIPHENHYDRAMINE HCL 25 MG PO CAPS
ORAL_CAPSULE | ORAL | Status: AC
Start: 2015-01-27 — End: 2015-01-27
  Filled 2015-01-27: qty 1

## 2015-01-27 MED ORDER — ACETAMINOPHEN 325 MG PO TABS
650.0000 mg | ORAL_TABLET | Freq: Once | ORAL | Status: AC
  Administered 2015-01-27: 650 mg via ORAL

## 2015-01-27 NOTE — Patient Instructions (Signed)
[facility] Discharge Instructions for Patients Receiving Chemotherapy  Today you received the following chemotherapy agents: Herceptin   To help prevent nausea and vomiting after your treatment, we encourage you to take your nausea medication as directed.    If you develop nausea and vomiting that is not controlled by your nausea medication, call the clinic.   BELOW ARE SYMPTOMS THAT SHOULD BE REPORTED IMMEDIATELY:  *FEVER GREATER THAN 100.5 F  *CHILLS WITH OR WITHOUT FEVER  NAUSEA AND VOMITING THAT IS NOT CONTROLLED WITH YOUR NAUSEA MEDICATION  *UNUSUAL SHORTNESS OF BREATH  *UNUSUAL BRUISING OR BLEEDING  TENDERNESS IN MOUTH AND THROAT WITH OR WITHOUT PRESENCE OF ULCERS  *URINARY PROBLEMS  *BOWEL PROBLEMS  UNUSUAL RASH Items with * indicate a potential emergency and should be followed up as soon as possible.  Feel free to call the clinic you have any questions or concerns. The clinic phone number is [phone].  Please show the CHEMO ALERT CARD at check-in to the Emergency Department and triage nurse.   

## 2015-02-07 ENCOUNTER — Other Ambulatory Visit: Payer: Self-pay

## 2015-02-07 ENCOUNTER — Other Ambulatory Visit: Payer: Self-pay | Admitting: Nurse Practitioner

## 2015-02-07 MED ORDER — CLONAZEPAM 1 MG PO TABS: 1.0000 mg | ORAL_TABLET | Freq: Every day | ORAL | Status: DC

## 2015-02-07 NOTE — Telephone Encounter (Signed)
Pharm reqs RF of clonazepam. 

## 2015-02-10 ENCOUNTER — Other Ambulatory Visit: Payer: Self-pay | Admitting: Emergency Medicine

## 2015-02-10 ENCOUNTER — Telehealth: Payer: Self-pay | Admitting: Emergency Medicine

## 2015-02-10 NOTE — Telephone Encounter (Signed)
Klonipin refill. Beth Rice  [phone]

## 2015-02-11 ENCOUNTER — Other Ambulatory Visit (HOSPITAL_BASED_OUTPATIENT_CLINIC_OR_DEPARTMENT_OTHER): Payer: BC Managed Care – PPO

## 2015-02-11 ENCOUNTER — Telehealth: Payer: Self-pay | Admitting: Oncology

## 2015-02-11 ENCOUNTER — Ambulatory Visit (HOSPITAL_BASED_OUTPATIENT_CLINIC_OR_DEPARTMENT_OTHER): Payer: BC Managed Care – PPO | Admitting: Oncology

## 2015-02-11 VITALS — BP 125/80 | HR 101 | Temp 98.6°F | Resp 18 | Ht 62.5 in | Wt 117.2 lb

## 2015-02-11 DIAGNOSIS — C50811 Malignant neoplasm of overlapping sites of right female breast: Secondary | ICD-10-CM

## 2015-02-11 DIAGNOSIS — G4700 Insomnia, unspecified: Secondary | ICD-10-CM | POA: Diagnosis not present

## 2015-02-11 DIAGNOSIS — F329 Major depressive disorder, single episode, unspecified: Secondary | ICD-10-CM

## 2015-02-11 DIAGNOSIS — C50211 Malignant neoplasm of upper-inner quadrant of right female breast: Secondary | ICD-10-CM

## 2015-02-11 DIAGNOSIS — F32A Depression, unspecified: Secondary | ICD-10-CM

## 2015-02-11 LAB — COMPREHENSIVE METABOLIC PANEL (CC13)
ALT: 21 U/L (ref 0–55)
AST: 23 U/L (ref 5–34)
Albumin: 3.8 g/dL (ref 3.5–5.0)
Alkaline Phosphatase: 81 U/L (ref 40–150)
Anion Gap: 8 mEq/L (ref 3–11)
BUN: 7.3 mg/dL (ref 7.0–26.0)
CALCIUM: 9.3 mg/dL (ref 8.4–10.4)
CHLORIDE: 102 meq/L (ref 98–109)
CO2: 28 meq/L (ref 22–29)
CREATININE: 0.8 mg/dL (ref 0.6–1.1)
EGFR: 84 mL/min/{1.73_m2} — ABNORMAL LOW (ref >90)
Glucose: 114 mg/dl (ref 70–140)
POTASSIUM: 3.8 meq/L (ref 3.5–5.1)
Sodium: 139 mEq/L (ref 136–145)
Total Bilirubin: 0.33 mg/dL (ref 0.20–1.20)
Total Protein: 6.8 g/dL (ref 6.4–8.3)

## 2015-02-11 LAB — CBC WITH DIFFERENTIAL/PLATELET
BASO%: 0.9 % (ref 0.0–2.0)
BASOS ABS: 0.1 10*3/uL (ref 0.0–0.1)
EOS ABS: 0.1 10*3/uL (ref 0.0–0.5)
EOS%: 2.2 % (ref 0.0–7.0)
HEMATOCRIT: 32.9 % — AB (ref 34.8–46.6)
HGB: 11.2 g/dL — ABNORMAL LOW (ref 11.6–15.9)
LYMPH#: 1.7 10*3/uL (ref 0.9–3.3)
LYMPH%: 26.2 % (ref 14.0–49.7)
MCH: 35.5 pg — AB (ref 25.1–34.0)
MCHC: 34.2 g/dL (ref 31.5–36.0)
MCV: 103.9 fL — ABNORMAL HIGH (ref 79.5–101.0)
MONO#: 0.5 10*3/uL (ref 0.1–0.9)
MONO%: 7.7 % (ref 0.0–14.0)
NEUT#: 4.1 10*3/uL (ref 1.5–6.5)
NEUT%: 63 % (ref 38.4–76.8)
Platelets: 220 10*3/uL (ref 145–400)
RBC: 3.16 10*6/uL — AB (ref 3.70–5.45)
RDW: 15.2 % — AB (ref 11.2–14.5)
WBC: 6.6 10*3/uL (ref 3.9–10.3)

## 2015-02-11 MED ORDER — GABAPENTIN 100 MG PO CAPS: 100.0000 mg | ORAL_CAPSULE | Freq: Three times a day (TID) | ORAL | Status: AC

## 2015-02-11 MED ORDER — ALPRAZOLAM 0.5 MG PO TABS: ORAL_TABLET | ORAL | Status: DC

## 2015-02-11 MED ORDER — SERTRALINE HCL 100 MG PO TABS: 100.0000 mg | ORAL_TABLET | Freq: Every day | ORAL | Status: DC

## 2015-02-11 NOTE — Progress Notes (Signed)
Beth Rice  [phone] [phone]     ID: Beth Rice DOB: February 14, 1956  MR#: [id number]  IFO#:[id number]  Patient Care Team: Darlyne Russian, MD as PCP - General (Family Medicine) [name], MD as Consulting Physician (General Surgery) Chauncey Cruel, MD as Consulting Physician (Oncology) Eppie Gibson, MD as Attending Physician (Radiation Oncology) Rockwell Germany, RN as Registered Nurse Mauro Kaufmann, RN as Registered Nurse PCP: [name], MD OTHER MD: Jamie Kato MD  CHIEF COMPLAINT: HER-2 positive breast cancer  CURRENT TREATMENT: anti-HER-2 immunotherapy  BREAST CANCER HISTORY: From the original intake note:  Beth Rice herself palpated a mass in her right breast but "didn't think much about it". She saw [name] for routine gynecologic follow-up and he also palpated and immediately set her up for right diagnostic mammography with tomosynthesis and ultrasonography at the [facility] Center 08/27/2014. In the upper outer quadrant of the right breast there was a spiculated mass accompanied by calcifications, the complex extending up to 2.5 cm. This was palpable and mobile. It measured approximately 2 cm by palpation. There was no palpable right axillary adenopathy. Ultrasound of the right axilla also showed normal axillary contents.  Ultrasound of the right breast confirmed an irregularly marginated hypoechoic mass measuring 1.8 cm. Biopsy was not immediately performed because of patient was on aspirin at the time. On 08/30/2014 Beth Rice underwent biopsy of the mass in question, with the pathology ([id number]) (showing an invasive ductal carcinoma, grade 2, estrogen receptor 2% positive with moderate staining intensity, progesterone receptor negative, with an MIB-1 of 81% and with HER-2 amplified, the signals ratio being 2.87 and the number per cell 4.30.  On 09/03/2024 she underwent bilateral breast MRI. This found the breast composition to  be category C. In the right breast there was a spiculated mass measuring 2.3 cm in close proximity to the pectoralis but without obvious invasion of the muscle. The rest of the breast, the left breast, and the lymph node areas well otherwise unremarkable.  Her subsequent history is as detailed below   INTERVAL HISTORY: Beth Rice returns for follow up of her breast cancer. She completed her chemotherapy August 8, and switched to trastuzumab every 3 weeks, with the first dose given 01/27/2015. She just had an echocardiogram which shows an ejection fraction in the 50-55%. Since the last visit here she also underwent right lumpectomy with sentinel lymph node sampling (on 01/21/2015, accession number [id number]). This showed no residual malignancy in the breast or the 2 samples sentinel lymph nodes. This is of course the best possible response.  REVIEW OF SYSTEMS: Beth Rice feels a little bit depressed. The first week after the news she was euphoric had a lot of visitors and everybody was very happy. That everybody left since "she doesn't have cancer anymore". She has felt isolated. She gets a lot of support from her 4 dogs, of course. She walks about 2 miles a day. She ran out of her Klonopin, which did not help. She has mild sinus symptoms, some joint pain here and there but tolerated the surgery without any unusual pain, fever, or bleeding. That has been refilled. A detailed review of systems today was otherwise stable.  PAST MEDICAL HISTORY: Past Medical History  Diagnosis Date  . Anxiety   . Elevated cholesterol   . Depression   . Chest pain     a. 09/2008 Cath: ER 60%, LM nl, LAD 68m 431m, LCX nondom, nl, RCA dom, 2058mDA/PLA nl.  .Marland Kitchen  Tobacco abuse   . H/O degenerative disc disease   . Headache     "frequency depends on the weather" (06/04/2014)  . Arthritis     "neck" (06/04/2014)  . Chronic lower back pain   . Gastroesophageal reflux disease     H/O  . Hot flashes   . Myocardial infarction 2010     "I think I had a heart attack in 2010" cath showed small blockage- no treatment  . Cancer 08/2014    Rt breast  . Anemia 12/2014    due to chemotherapy -received blood transfusion    PAST SURGICAL HISTORY: Past Surgical History  Procedure Laterality Date  . Ovarian cyst removal  1980's?  . Tonsillectomy    . Cardiac catheterization  10/15/2008    noncritical mid & distal third anterior descending disease  . Portacath placement Left 09/17/2014    Procedure: INSERTION PORT-A-CATH;  Surgeon: [name], MD;  Location: [facility];  Service: General;  Laterality: Left;  . Breast lumpectomy with radioactive seed and sentinel lymph node biopsy Right 01/21/2015    Procedure: BREAST LUMPECTOMY WITH RADIOACTIVE SEED AND SENTINEL LYMPH NODE BIOPSY;  Surgeon: [name], MD;  Location: [place];  Service: General;  Laterality: Right;    FAMILY HISTORY Family History  Problem Relation Age of Onset  . Hypertension Sister   . Heart disease Mother   . Alzheimer's disease Mother   . Cerebral aneurysm Father   . Mental illness Father   . Cancer Maternal Aunt 45    ovarian  . Cancer Other 27    mat great aunt (through [facility]) with colorectal cancer  . Cancer Other 3    mat great uncle (through [facility]) with prostate cancer  . Cancer Cousin 59    paternal female first cousin through an uncle with pancreatic cancer  The patient's father died at the age of 65 after having cerebral hemorrhages and age 75 and 51. The patient's mother died at the age of 1 with Parkinson's and Alzheimer's disease. The patient's mother had one sister and this sister was diagnosed with ovarian cancer in her early 51s. There is no other history of breast or ovarian cancer in the family.   GYNECOLOGIC HISTORY:  No LMP recorded. Patient is postmenopausal.  menarche age 66, the patient is GX P0. She stopped having periods in 2005. She did not take hormone replacement.   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 She is in process of retiring, with targeted retirement date in June. She is single, lives alone with 4 dogs.    ADVANCED DIRECTIVES: Not in place. She tells me she plans to name her sister [name] as her healthcare power of attorney. Beth Rice (who is a Marine scientist) can be reached in [place] at [phone] (cell) or [phone] (home).   HEALTH MAINTENANCE: Social History  Substance Use Topics  . Smoking status: Former Smoker -- 1.00 packs/day for 20 years    Types: Cigarettes    Quit date: 06/21/2014  . Smokeless tobacco: Never Used  . Alcohol Use: No     Colonoscopy: 2006/[facility]  QMG:NOIBB 2016   B[facility] density:09/10/[facility]/normal   Lipid panel:  Allergies  Allergen Reactions  . Penicillins Hives  . Statins     Patient has significant muscle cramps with statins. She cannot tolerate them.  . Sulfa Antibiotics Hives    Current Outpatient Prescriptions  Medication Sig Dispense Refill  . acyclovir (ZOVIRAX) 400 MG tablet Take 1 tablet (400 mg total) by mouth 2 (two) times daily. 60 tablet 3  . ALPRAZolam (XANAX) 0.5 MG tablet TAKE 1 TABLET BY MOUTH EVERY 6 HOURS AS NEEDED FOR MUSCLE SPASMS 30 tablet 2  . aspirin EC 81 MG tablet Take 81 mg by mouth daily.    . Calcium Carbonate-Vitamin D (CALCIUM + D PO) Take 1 tablet by mouth at bedtime.     . clonazePAM (KLONOPIN) 1 MG tablet Take 1 tablet (1 mg total) by mouth at bedtime. 30 tablet 0  . fluticasone (FLONASE) 50 MCG/ACT nasal spray Place 2 sprays into both nostrils daily. (Patient not taking: Reported on 11/25/2014) 16 g 11  . gabapentin (NEURONTIN) 100 MG capsule Take 1 capsule (100 mg total) by mouth 3 (three) times daily. Take 1-3 capsules up to three times daily 60 capsule 2  . Lysine 500 MG CAPS Take 500 mg by mouth at bedtime.     . Multiple Vitamins-Minerals (MULTIVITAMIN WITH MINERALS) tablet Take 1  tablet by mouth at bedtime.     Marland Kitchen omeprazole (PRILOSEC) 40 MG capsule Take 1 capsule (40 mg total) by mouth daily. 30 capsule 2  . sertraline (ZOLOFT) 100 MG tablet Take 1 tablet (100 mg total) by mouth daily. 30 tablet 3   No current facility-administered medications for this visit.    OBJECTIVE: middle-aged white woman in no acute distress Filed Vitals:   02/11/15 1149  BP: 125/80  Pulse: 101  Temp: 98.6 F (37 C)  Resp: 18     Body mass index is 21.08 kg/(m^2).    ECOG FS:0 - Asymptomatic  Sclerae unicteric, EOMs intact Oropharynx clear, dentition in good repair No cervical or supraclavicular adenopathy Lungs no rales or rhonchi Heart regular rate and rhythm Abd soft, nontender, positive bowel sounds MSK no focal spinal tenderness, no upper extremity lymphedema Neuro: nonfocal, well oriented, appropriate affect Breasts: The right breast is status post recent lumpectomy. The incisions are healing nicely. There is no dehiscence, erythema, or swelling. The cosmetic result is excellent. The right axilla is benign per the left breast is unremarkable    LAB RESULTS:  CMP     Component Value Date/Time   NA 139 02/11/2015 1132   NA 139 06/18/2014 1133   K 3.8 02/11/2015 1132   K 4.2 06/18/2014 1133   CL 103 06/18/2014 1133   CO2 28 02/11/2015 1132   CO2 27 06/18/2014 1133   GLUCOSE 114 02/11/2015 1132   GLUCOSE 93 06/18/2014 1133   BUN 7.3 02/11/2015 1132   BUN 7 06/18/2014 1133   CREATININE 0.8 02/11/2015 1132   CREATININE 0.73 06/18/2014 1133   CREATININE 0.64 06/03/2014 1203   CALCIUM 9.3 02/11/2015 1132   CALCIUM 9.3 06/18/2014 1133   PROT 6.8 02/11/2015 1132   PROT 6.8 10/23/2013 1030   ALBUMIN 3.8 02/11/2015 1132   ALBUMIN 4.5 10/23/2013 1030   AST 23 02/11/2015 1132   AST 25 10/23/2013 1030   ALT 21 02/11/2015 1132   ALT 16 10/23/2013 1030   ALKPHOS 81 02/11/2015 1132   ALKPHOS 68 10/23/2013 1030   BILITOT 0.33 02/11/2015 1132   BILITOT 0.7 10/23/2013  1030   GFRNONAA >90 06/03/2014 1203   GFRNONAA >89 10/23/2013 1030   GFRAA >90 06/03/2014 1203   GFRAA >89 10/23/2013 1030    INo results found for: SPEP, UPEP  Lab Results  Component Value Date   WBC 6.6 02/11/2015  NEUTROABS 4.1 02/11/2015   HGB 11.2* 02/11/2015   HCT 32.9* 02/11/2015   MCV 103.9* 02/11/2015   PLT 220 02/11/2015      Chemistry      Component Value Date/Time   NA 139 02/11/2015 1132   NA 139 06/18/2014 1133   K 3.8 02/11/2015 1132   K 4.2 06/18/2014 1133   CL 103 06/18/2014 1133   CO2 28 02/11/2015 1132   CO2 27 06/18/2014 1133   BUN 7.3 02/11/2015 1132   BUN 7 06/18/2014 1133   CREATININE 0.8 02/11/2015 1132   CREATININE 0.73 06/18/2014 1133   CREATININE 0.64 06/03/2014 1203      Component Value Date/Time   CALCIUM 9.3 02/11/2015 1132   CALCIUM 9.3 06/18/2014 1133   ALKPHOS 81 02/11/2015 1132   ALKPHOS 68 10/23/2013 1030   AST 23 02/11/2015 1132   AST 25 10/23/2013 1030   ALT 21 02/11/2015 1132   ALT 16 10/23/2013 1030   BILITOT 0.33 02/11/2015 1132   BILITOT 0.7 10/23/2013 1030       No results found for: LABCA2  No components found for: LABCA125  No results for input(s): INR in the last 168 hours.  Urinalysis    Component Value Date/Time   COLORURINE YELLOW 12/23/2014 1335   APPEARANCEUR CLEAR 12/23/2014 1335   LABSPEC 1.007 12/23/2014 1335   PHURINE 6.0 12/23/2014 1335   GLUCOSEU NEGATIVE 12/23/2014 1335   HGBUR NEGATIVE 12/23/2014 1335   BILIRUBINUR NEGATIVE 12/23/2014 1335   BILIRUBINUR neg 06/18/2014 1103   KETONESUR NEGATIVE 12/23/2014 1335   PROTEINUR NEGATIVE 12/23/2014 1335   PROTEINUR neg 06/18/2014 1103   UROBILINOGEN 0.2 12/23/2014 1335   UROBILINOGEN 0.2 06/18/2014 1103   NITRITE NEGATIVE 12/23/2014 1335   NITRITE neg 06/18/2014 1103   LEUKOCYTESUR NEGATIVE 12/23/2014 1335    STUDIES: Nm Sentinel Node Inj-no Rpt (breast)  01/21/2015   CLINICAL DATA: right breast cancer   Sulfur colloid was injected  intradermally by the nuclear medicine  technologist for breast cancer sentinel node localization.    Mm Breast Surgical Specimen  01/21/2015   CLINICAL DATA:  59 year old female with invasive right breast cancer post lumpectomy.  EXAM: SPECIMEN RADIOGRAPH OF THE RIGHT BREAST  COMPARISON:  Previous exam(s).  FINDINGS: Status post excision of the right breast. The radioactive seed and biopsy marker clip are present, completely intact, and were marked for pathology.  IMPRESSION: Specimen radiograph of the right breast.   Electronically Signed   By: Everlean Alstrom M.D.   On: 01/21/2015 15:16   Mm Rt Radioactive Seed Loc Mammo Guide  01/17/2015   CLINICAL DATA:  Patient presents for radioactive seed localization of a known right breast carcinoma, diagnosed and April 2016 and treated with neoadjuvant chemotherapy.  EXAM: MAMMOGRAPHIC GUIDED RADIOACTIVE SEED LOCALIZATION OF THE RIGHT BREAST  COMPARISON:  Previous exam(s).  FINDINGS: Patient presents for radioactive seed localization prior to surgical excision of the known right breast carcinoma. I met with the patient and we discussed the procedure of seed localization including benefits and alternatives. We discussed the high likelihood of a successful procedure. We discussed the risks of the procedure including infection, bleeding, tissue injury and further surgery. We discussed the low dose of radioactivity involved in the procedure. Informed, written consent was given.  The usual time-out protocol was performed immediately prior to the procedure.  Using mammographic guidance, sterile technique, 2% lidocaine and an I-125 radioactive seed, the posterior, upper outer quadrant right breast lesion was localized using a cranial to  caudal approach. The follow-up mammogram images confirm the seed in the expected location and were marked for Dr. Barry Dienes.  Follow-up survey of the patient confirms presence of the radioactive seed.  Order number of I-125 seed:  [id number].   Total activity:  0.249 mCi  Reference Date: 01/17/2015  The patient tolerated the procedure well and was released from the [facility]. She was given instructions regarding seed removal.  IMPRESSION: Radioactive seed localization right breast. No apparent complications.   Electronically Signed   By: Lajean Manes M.D.   On: 01/17/2015 16:16     Transthoracic Echocardiography  Patient:  Myrka, Sylva MR #:    [id number] Study Date: 01/08/2015 Gender:   F Age:    59 Height:   157.5 cm Weight:   55.8 kg BSA:    1.57 m^2 Pt. Status: Room:  PERFORMING  [facility], Outpatient ATTENDING  [place], [place]   Bo[place], [place]  cc:  ------------------------------------------------------------------- LV EF: 50% -  55%  ASSESSMENT: 59 y.o. [place] woman status post right breast biopsy 08/30/2014 for a clinical T2 N0, stage IIA invasive ductal carcinoma, grade 2, estrogen receptor2% "positive," progesterone receptor negative, HER-2 positive, with an MIB-1 of 81%.  (1) neoadjuvant chemotherapy consisting of carboplatin, docetaxel, trastuzumab and pertuzumab x 6  beginning 09/23/14, completed 01/06/2015 (a) docetaxel switched for gemcitabine for cycles 5 and 6 because of acute bilateral lower extremity edema  (2) trastuzumab will be continued to complete 1 year (through April 2017)  (a) most recent echo 01/08/2015 showed an ejection fraction between 50% and 55%  (3) status post right lumpectomy and sentinel lymph node sampling 01/21/2015 showing a complete pathologic response  (4) adjuvant radiation will follow surgery  (5) consider anti-estrogens after radiation  (6) genetics testing pending  PLAN: Beth Rice's depression is partly due to her isolation, partly due to the letdown but having completed such an arduous task and while the result she got is the best possible result  nevertheless it is a "negative" result--she got something to not happen is that of some thing to happen. In my experience this depression is not unusual and it is not long-lasting.  She is already exercising which is part of it. Today we also increased her sertraline 200 mg daily. She will let me know if this does not help. We can always add Wellbutrin if necessary  In the meantime she should be starting her radiation and I went ahead and placed that referral. She will see me again in early December and before that she will have a repeat echocardiogram. Of course we are continuing the trastuzumab every 3 weeks to complete 1 year.  At this point I am delighted with the results of her treatment. Her long-term prognosis is excellent. Nevertheless we will discuss tamoxifen one she finishes radiation chiefly for prophylaxis.    Chauncey Cruel, MD   02/11/2015 12:16 PM

## 2015-02-11 NOTE — Telephone Encounter (Signed)
Gave avs & appointments. Sent message to Echo for December.

## 2015-02-11 NOTE — Addendum Note (Signed)
Addended by: Laureen Abrahams on: 02/11/2015 05:01 PM   Modules accepted: Medications

## 2015-02-11 NOTE — Telephone Encounter (Signed)
Called in Rx and notified pt on VM. 

## 2015-02-17 ENCOUNTER — Ambulatory Visit (HOSPITAL_BASED_OUTPATIENT_CLINIC_OR_DEPARTMENT_OTHER): Payer: BC Managed Care – PPO

## 2015-02-17 ENCOUNTER — Encounter: Payer: Self-pay | Admitting: *Deleted

## 2015-02-17 ENCOUNTER — Other Ambulatory Visit (HOSPITAL_BASED_OUTPATIENT_CLINIC_OR_DEPARTMENT_OTHER): Payer: BC Managed Care – PPO

## 2015-02-17 VITALS — BP 123/79 | HR 76 | Temp 98.7°F | Resp 18

## 2015-02-17 DIAGNOSIS — Z5112 Encounter for antineoplastic immunotherapy: Secondary | ICD-10-CM | POA: Diagnosis not present

## 2015-02-17 DIAGNOSIS — C50811 Malignant neoplasm of overlapping sites of right female breast: Secondary | ICD-10-CM

## 2015-02-17 DIAGNOSIS — C50211 Malignant neoplasm of upper-inner quadrant of right female breast: Secondary | ICD-10-CM

## 2015-02-17 LAB — CBC WITH DIFFERENTIAL/PLATELET
BASO%: 0.4 % (ref 0.0–2.0)
BASOS ABS: 0 10*3/uL (ref 0.0–0.1)
EOS ABS: 0.2 10*3/uL (ref 0.0–0.5)
EOS%: 4.8 % (ref 0.0–7.0)
HEMATOCRIT: 32.8 % — AB (ref 34.8–46.6)
HEMOGLOBIN: 11 g/dL — AB (ref 11.6–15.9)
LYMPH#: 1.4 10*3/uL (ref 0.9–3.3)
LYMPH%: 29.6 % (ref 14.0–49.7)
MCH: 35 pg — AB (ref 25.1–34.0)
MCHC: 33.5 g/dL (ref 31.5–36.0)
MCV: 104.5 fL — AB (ref 79.5–101.0)
MONO#: 0.4 10*3/uL (ref 0.1–0.9)
MONO%: 8.2 % (ref 0.0–14.0)
NEUT%: 57 % (ref 38.4–76.8)
NEUTROS ABS: 2.7 10*3/uL (ref 1.5–6.5)
PLATELETS: 170 10*3/uL (ref 145–400)
RBC: 3.14 10*6/uL — ABNORMAL LOW (ref 3.70–5.45)
RDW: 14.1 % (ref 11.2–14.5)
WBC: 4.8 10*3/uL (ref 3.9–10.3)

## 2015-02-17 LAB — COMPREHENSIVE METABOLIC PANEL (CC13)
ALBUMIN: 3.7 g/dL (ref 3.5–5.0)
ALK PHOS: 75 U/L (ref 40–150)
ALT: 21 U/L (ref 0–55)
AST: 28 U/L (ref 5–34)
Anion Gap: 7 mEq/L (ref 3–11)
BILIRUBIN TOTAL: 0.24 mg/dL (ref 0.20–1.20)
BUN: 8.4 mg/dL (ref 7.0–26.0)
CALCIUM: 9.1 mg/dL (ref 8.4–10.4)
CO2: 30 mEq/L — ABNORMAL HIGH (ref 22–29)
Chloride: 104 mEq/L (ref 98–109)
Creatinine: 0.8 mg/dL (ref 0.6–1.1)
EGFR: 84 mL/min/{1.73_m2} — AB (ref >90)
GLUCOSE: 118 mg/dL (ref 70–140)
POTASSIUM: 3.7 meq/L (ref 3.5–5.1)
Sodium: 141 mEq/L (ref 136–145)
TOTAL PROTEIN: 6.7 g/dL (ref 6.4–8.3)

## 2015-02-17 MED ORDER — ACETAMINOPHEN 325 MG PO TABS
650.0000 mg | ORAL_TABLET | Freq: Once | ORAL | Status: AC
  Administered 2015-02-17: 650 mg via ORAL

## 2015-02-17 MED ORDER — TRASTUZUMAB CHEMO INJECTION 440 MG
6.0000 mg/kg | Freq: Once | INTRAVENOUS | Status: AC
  Administered 2015-02-17: 315 mg via INTRAVENOUS
  Filled 2015-02-17: qty 15

## 2015-02-17 MED ORDER — HEPARIN SOD (PORK) LOCK FLUSH 100 UNIT/ML IV SOLN
500.0000 [IU] | Freq: Once | INTRAVENOUS | Status: AC | PRN
  Administered 2015-02-17: 500 [IU]
  Filled 2015-02-17: qty 5

## 2015-02-17 MED ORDER — DIPHENHYDRAMINE HCL 25 MG PO CAPS
25.0000 mg | ORAL_CAPSULE | Freq: Once | ORAL | Status: AC
  Administered 2015-02-17: 25 mg via ORAL

## 2015-02-17 MED ORDER — DIPHENHYDRAMINE HCL 25 MG PO CAPS
ORAL_CAPSULE | ORAL | Status: AC
  Filled 2015-02-17: qty 1

## 2015-02-17 MED ORDER — ACETAMINOPHEN 325 MG PO TABS
ORAL_TABLET | ORAL | Status: AC
  Filled 2015-02-17: qty 2

## 2015-02-17 MED ORDER — SODIUM CHLORIDE 0.9 % IV SOLN
Freq: Once | INTRAVENOUS | Status: AC
  Administered 2015-02-17: 11:00:00 via INTRAVENOUS

## 2015-02-17 MED ORDER — SODIUM CHLORIDE 0.9 % IJ SOLN
10.0000 mL | INTRAMUSCULAR | Status: DC | PRN
  Administered 2015-02-17: 10 mL
  Filled 2015-02-17: qty 10

## 2015-02-17 NOTE — Patient Instructions (Signed)
[facility] Discharge Instructions for Patients Receiving Chemotherapy  Today you received the following chemotherapy agents Herceptin.  To help prevent nausea and vomiting after your treatment, we encourage you to take your nausea medication as prescribed by your physician. If you develop nausea and vomiting that is not controlled by your nausea medication, call the clinic.   BELOW ARE SYMPTOMS THAT SHOULD BE REPORTED IMMEDIATELY:  *FEVER GREATER THAN 100.5 F  *CHILLS WITH OR WITHOUT FEVER  NAUSEA AND VOMITING THAT IS NOT CONTROLLED WITH YOUR NAUSEA MEDICATION  *UNUSUAL SHORTNESS OF BREATH  *UNUSUAL BRUISING OR BLEEDING  TENDERNESS IN MOUTH AND THROAT WITH OR WITHOUT PRESENCE OF ULCERS  *URINARY PROBLEMS  *BOWEL PROBLEMS  UNUSUAL RASH Items with * indicate a potential emergency and should be followed up as soon as possible.  Feel free to call the clinic you have any questions or concerns. The clinic phone number is [phone].  Please show the CHEMO ALERT CARD at check-in to the Emergency Department and triage nurse.   

## 2015-02-18 NOTE — Progress Notes (Signed)
Location of Breast Cancer:Right Breast  Histology per Pathology Report:01/21/15 FINAL DIAGNOSIS Diagnosis 1. Breast, lumpectomy, Right - FIBROSIS WITH FOCAL CALCIFICATIONS. - NO RESIDUAL MALIGNANCY IDENTIFIED. - FIBROCYSTIC CHANGES WITH CALCIFICATIONS. - BIOPSY SITE REACTION. - FINAL MARGINS CLEAR. 2. Lymph node, sentinel, biopsy, Right axillary #1 - ONE BENIGN LYMPH NODE (0/1). 3. Lymph node, sentinel, biopsy, Right axillary #2 - ONE BENIGN LYMPH NODE (0/1).  Receptor Status: ER(+, PR (-), Her2-neu (+)  Did patient present with symptoms (if so, please note symptoms) or was this found on screening mammography?:  Patient felt mass in March but, ball didn't begin to roll toward diagnosis until April after seeing her GYN   Past/Anticipated interventions by surgeon, if any:01/21/15 BREAST LUMPECTOMY WITH RADIOACTIVE SEED AND SENTINEL LYMPH NODE BIOPSY  Past/Anticipated interventions by medical oncology, if any: Chemotherapy /(1) neoadjuvant chemotherapy consisting of carboplatin, docetaxel, trastuzumab and pertuzumab x 6 beginning 09/23/14, completed 01/06/2015(a) docetaxel switched for gemcitabine for cycles 5 and 6 because of acute bilateral lower extremity edema  (2) trastuzumab will be continued to complete 1 year (through April 2017) (a) most recent echo 01/08/2015 showed an ejection fraction between 50% and 55%  (3) status post right lumpectomy and sentinel lymph node sampling 01/21/2015 showing a complete pathologic response  (4) adjuvant radiation will follow surgery  (5) consider anti-estrogens after radiation  (6) genetics testing pending  Lymphedema issues, if any: reports edema in right arm that has improved greatly; denies parting in PT or wearing a lymphedema sleeve  Pain issues, if any: reports low back pain related to effects of herceptin 4 on a scale of 0-10  SAFETY ISSUES:  Prior radiation? No  Pacemaker/ICD? No  Possible current  pregnancy?post-menopausal  Is the patient on methotrexate?No  Current Complaints / other details: Quit smoking January 2016  Allergies:penicillin, statins and sulfa   Patient has power port on left side. Arlyss Repress, RN 02/18/2015,6:07 PM

## 2015-02-19 ENCOUNTER — Encounter: Payer: Self-pay | Admitting: Radiation Oncology

## 2015-02-19 ENCOUNTER — Ambulatory Visit
Admission: RE | Admit: 2015-02-19 | Discharge: 2015-02-19 | Disposition: A | Discharge status: Home / Self Care | Payer: BC Managed Care – PPO | Source: Ambulatory Visit | Attending: Radiation Oncology | Admitting: Radiation Oncology

## 2015-02-19 VITALS — BP 107/76 | HR 88 | Temp 99.0°F | Resp 16 | Wt 120.2 lb

## 2015-02-19 DIAGNOSIS — C50211 Malignant neoplasm of upper-inner quadrant of right female breast: Secondary | ICD-10-CM

## 2015-02-19 DIAGNOSIS — Z510 Encounter for antineoplastic radiation therapy: Secondary | ICD-10-CM | POA: Insufficient documentation

## 2015-02-19 DIAGNOSIS — Z170 Estrogen receptor positive status [ER+]: Secondary | ICD-10-CM | POA: Diagnosis not present

## 2015-02-19 DIAGNOSIS — D0591 Unspecified type of carcinoma in situ of right breast: Secondary | ICD-10-CM

## 2015-02-19 NOTE — Addendum Note (Signed)
Encounter addended by: Eppie Gibson, MD on: 02/19/2015  4:30 PM<BR>     Documentation filed: Follow-up Section

## 2015-02-19 NOTE — Progress Notes (Signed)
  Radiation Oncology         [phone] ________________________________  Name: Beth Rice MRN: [id number]  Date: 02/19/2015  DOB: 1955/07/27  SIMULATION AND TREATMENT PLANNING NOTE    Outpatient  DIAGNOSIS:     ICD-9-CM ICD-10-CM   1. Breast cancer of upper-inner quadrant of right female breast 174.2 C50.211     NARRATIVE:  The patient was brought to the [place].  Identity was confirmed.  All relevant records and images related to the planned course of therapy were reviewed.  The patient freely provided informed written consent to proceed with treatment after reviewing the details related to the planned course of therapy. The consent form was witnessed and verified by the simulation staff.    Then, the patient was set-up in a stable reproducible supine position for radiation therapy with her ipsilateral arm over her head, and her upper body secured in a custom-made Vac-lok device.  CT images were obtained.  Surface markings were placed.  The CT images were loaded into the planning software.    TREATMENT PLANNING NOTE: Treatment planning then occurred.  The radiation prescription was entered and confirmed.     A total of 3 medically necessary complex treatment devices were fabricated and supervised by me: 2 fields with MLCs for custom blocks to protect heart, and lungs;  and, a Vac-lok. I have requested : 3D Simulation  I have requested a DVH of the following structures: lungs, heart, lumpectomy cavity.    The patient will receive 50 Gy in 25 fractions to the right breast with 2 tangential fields.  This will be followed by a boost.  Optical Surface Tracking Plan:  Since intensity modulated radiotherapy (IMRT) and 3D conformal radiation treatment methods are predicated on accurate and precise positioning for treatment, intrafraction motion monitoring is medically necessary to ensure accurate and safe treatment delivery. The ability to quantify intrafraction motion  without excessive ionizing radiation dose can only be performed with optical surface tracking. Accordingly, surface imaging offers the opportunity to obtain 3D measurements of patient position throughout IMRT and 3D treatments without excessive radiation exposure. I am ordering optical surface tracking for this patient's upcoming course of radiotherapy.  ________________________________   Reference:  Ursula Alert, J, et al. Surface imaging-based analysis of intrafraction motion for breast radiotherapy patients.Journal of [place], n. 6, nov. 2014. ISSN [id number].  Available at: <http://www.jacmp.org/index.php/jacmp/article/view/4957>.    -----------------------------------  Eppie Gibson, MD

## 2015-02-19 NOTE — Progress Notes (Signed)
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 Breast cancer of upper-inner quadrant of right female breast 174.2 C50.211      Narrative:  The patient returns today for follow-up.  Since consultation, she underwent chemotherapy completed on 01/06/15.  She then underwent a lumpectomy and SLN  biopsy on 01/20/25 with complete response. She experienced some swelling in the axilla after surgery but this has subsided.   Will continue Herceptin maintenance x 1 yr.      ALLERGIES:  is allergic to penicillins; statins; and sulfa antibiotics.  Meds: Current Outpatient Prescriptions  Medication Sig Dispense Refill  . acyclovir (ZOVIRAX) 400 MG tablet Take 1 tablet (400 mg total) by mouth 2 (two) times daily. 60 tablet 3  . ALPRAZolam (XANAX) 0.5 MG tablet TAKE 1 TABLET BY MOUTH EVERY 6 HOURS AS NEEDED FOR MUSCLE SPASMS 30 tablet 2  . aspirin EC 81 MG tablet Take 81 mg by mouth daily.    . Calcium Carbonate-Vitamin D (CALCIUM + D PO) Take 1 tablet by mouth at bedtime.     . clonazePAM (KLONOPIN) 1 MG tablet Take 1 tablet (1 mg total) by mouth at bedtime. 30 tablet 0  . fluticasone (FLONASE) 50 MCG/ACT nasal spray Place 2 sprays into both nostrils daily. 16 g 11  . gabapentin (NEURONTIN) 100 MG capsule Take 1 capsule (100 mg total) by mouth 3 (three) times daily. Take 1-3 capsules up to three times daily 60 capsule 2  . loratadine (CLARITIN) 10 MG tablet Take 10 mg by mouth daily.    Marland Kitchen Lysine 500 MG CAPS Take 500 mg by mouth at bedtime.     . Multiple Vitamins-Minerals (MULTIVITAMIN WITH MINERALS) tablet Take 1 tablet by mouth at bedtime.     Marland Kitchen omeprazole  (PRILOSEC) 40 MG capsule Take 1 capsule (40 mg total) by mouth daily. 30 capsule 2  . sertraline (ZOLOFT) 100 MG tablet Take 1 tablet (100 mg total) by mouth daily. 30 tablet 3   No current facility-administered medications for this encounter.    Physical Findings:  weight is 120 lb 3.2 oz (54.522 kg). Her oral temperature is 99 F (37.2 C). Her blood pressure is 107/76 and her pulse is 88. Her respiration is 16 and oxygen saturation is 99%. .     General: Alert and oriented, in no acute distress   Neck: Neck is supple, no palpable cervical or supraclavicular lymphadenopathy. Heart: Regular in rate and rhythm with no murmurs, rubs, or gallops. Chest: Clear to auscultation bilaterally, with no rhonchi, wheezes, or rales. Extremities: No cyanosis or edema.  No lymphedema Lymphatics: see Neck Exam Psychiatric: Judgment and insight are intact. Affect is appropriate. Breast exam: Axillary scar and the R lumpectomy scar have healed well with no significant swelling and no active drainage.  Lab Findings: Lab Results  Component Value Date   WBC 4.8 02/17/2015   HGB 11.0* 02/17/2015   HCT 32.8* 02/17/2015   MCV 104.5* 02/17/2015   PLT 170 02/17/2015       Radiographic Findings: Nm Sentinel Node Inj-no Rpt (breast)  01/21/2015   CLINICAL DATA: right breast  cancer   Sulfur colloid was injected intradermally by the nuclear medicine  technologist for breast cancer sentinel node localization.    Mm Breast Surgical Specimen  01/21/2015   CLINICAL DATA:  59 year old female with invasive right breast cancer post lumpectomy.  EXAM: SPECIMEN RADIOGRAPH OF THE RIGHT BREAST  COMPARISON:  Previous exam(s).  FINDINGS: Status post excision of the right breast. The radioactive seed and biopsy marker clip are present, completely intact, and were marked for pathology.  IMPRESSION: Specimen radiograph of the right breast.   Electronically Signed   By: Everlean Alstrom M.D.   On: 01/21/2015 15:16     Impression/Plan: We discussed adjuvant radiotherapy today.  I recommend radiotherapy to the right breast in order to decrease her chance of local region of recurrence.  The risks, benefits and side effects of this treatment were discussed in detail.  She understands that radiotherapy is associated with skin irritation and fatigue in the acute setting. Late effects can include cosmetic changes and rare injury to internal organs.   She is enthusiastic about proceeding with treatment. A consent form has been signed and placed in her chart.  Proceed with simulation/treatment planning today.   This document serves as a record of services personally performed by Eppie Gibson, MD. It was created on her behalf by Janace Hoard, a trained medical scribe. The creation of this record is based on the scribe's personal observations and the provider's statements to them. This document has been checked and approved by the attending provider.  _____________________________________   Eppie Gibson, MD

## 2015-02-19 NOTE — Progress Notes (Signed)
See progress note under physician encounter. 

## 2015-02-19 NOTE — Progress Notes (Addendum)
Reports depression. She spoke with Dr. Jana Hakim about these feelings. Magrinat encourage her to walk daily and increase Zoloft to 100 mg. Patient reports she didn't like the way she felt with the increase thus, she went back down to 50 mg. Patient plans to follow back up with Magrinat about changing antidepressant if these feeling continue. 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 Patient reports neuropathy in finger tips. Patient accompanied today by sister, [name].

## 2015-02-25 DIAGNOSIS — Z510 Encounter for antineoplastic radiation therapy: Secondary | ICD-10-CM | POA: Diagnosis not present

## 2015-02-26 ENCOUNTER — Ambulatory Visit
Admission: RE | Admit: 2015-02-26 | Discharge: 2015-02-26 | Disposition: A | Discharge status: Home / Self Care | Payer: BC Managed Care – PPO | Source: Ambulatory Visit | Attending: Radiation Oncology | Admitting: Radiation Oncology

## 2015-02-26 DIAGNOSIS — Z510 Encounter for antineoplastic radiation therapy: Secondary | ICD-10-CM | POA: Diagnosis not present

## 2015-02-27 ENCOUNTER — Ambulatory Visit
Admission: RE | Admit: 2015-02-27 | Discharge: 2015-02-27 | Disposition: A | Discharge status: Home / Self Care | Payer: BC Managed Care – PPO | Source: Ambulatory Visit | Attending: Radiation Oncology | Admitting: Radiation Oncology

## 2015-02-27 DIAGNOSIS — Z510 Encounter for antineoplastic radiation therapy: Secondary | ICD-10-CM | POA: Diagnosis not present

## 2015-02-28 ENCOUNTER — Ambulatory Visit
Admission: RE | Admit: 2015-02-28 | Discharge: 2015-02-28 | Disposition: A | Discharge status: Home / Self Care | Payer: BC Managed Care – PPO | Source: Ambulatory Visit | Attending: Radiation Oncology | Admitting: Radiation Oncology

## 2015-02-28 DIAGNOSIS — Z510 Encounter for antineoplastic radiation therapy: Secondary | ICD-10-CM | POA: Diagnosis not present

## 2015-03-03 ENCOUNTER — Encounter: Payer: Self-pay | Admitting: Radiation Oncology

## 2015-03-03 ENCOUNTER — Telehealth: Payer: Self-pay | Admitting: *Deleted

## 2015-03-03 ENCOUNTER — Ambulatory Visit
Admission: RE | Admit: 2015-03-03 | Discharge: 2015-03-03 | Disposition: A | Discharge status: Home / Self Care | Payer: BC Managed Care – PPO | Source: Ambulatory Visit | Attending: Radiation Oncology | Admitting: Radiation Oncology

## 2015-03-03 VITALS — BP 116/76 | HR 95 | Temp 98.8°F | Ht 62.5 in | Wt 119.5 lb

## 2015-03-03 DIAGNOSIS — Z510 Encounter for antineoplastic radiation therapy: Secondary | ICD-10-CM | POA: Insufficient documentation

## 2015-03-03 DIAGNOSIS — C50211 Malignant neoplasm of upper-inner quadrant of right female breast: Secondary | ICD-10-CM | POA: Insufficient documentation

## 2015-03-03 MED ORDER — RADIAPLEXRX EX GEL
Freq: Once | CUTANEOUS | Status: AC
Start: 2015-03-03 — End: 2015-03-03
  Administered 2015-03-03: 16:00:00 via TOPICAL

## 2015-03-03 MED ORDER — BIAFINE EX EMUL: CUTANEOUS | Status: DC | PRN

## 2015-03-03 MED ORDER — ALRA NON-METALLIC DEODORANT (RAD-ONC)
1.0000 "application " | Freq: Once | TOPICAL | Status: AC
  Administered 2015-03-03: 1 via TOPICAL

## 2015-03-03 NOTE — Progress Notes (Signed)
   Weekly Management Note:  Outpatient    ICD-9-CM ICD-10-CM   1. Breast cancer of upper-inner quadrant of right female breast (HCC) 174.2 C50.211 topical emolient (BIAFINE) emulsion     non-metallic deodorant (ALRA) 1 application     hyaluronate sodium (RADIAPLEXRX) gel    Current Dose:  6 Gy  Projected Dose: 60 Gy   Narrative:  The patient presents for routine under treatment assessment.  CBCT/MVCT images/Port film x-rays were reviewed.  The chart was checked. No new complaints  Physical Findings:  height is 5' 2.5" (1.588 m) and weight is 119 lb 8 oz (54.205 kg). Her temperature is 98.8 F (37.1 C). Her blood pressure is 116/76 and her pulse is 95.   Wt Readings from Last 3 Encounters:  03/03/15 119 lb 8 oz (54.205 kg)  02/19/15 120 lb 3.2 oz (54.522 kg)  02/11/15 117 lb 3.2 oz (53.162 kg)   No skin changes over right breast  Impression:  The patient is tolerating radiotherapy.  Plan:  Continue radiotherapy as planned.    ________________________________   Eppie Gibson, M.D.

## 2015-03-03 NOTE — Telephone Encounter (Signed)
Left vm for pt to return call regarding needs during xrt. Contact information given. 

## 2015-03-03 NOTE — Progress Notes (Signed)
Pt here for patient teaching.  Pt given Radiation and You booklet, skin care instructions, Alra deodorant and Radiaplex gel. Pt reports they have watched the Radiation Therapy Education video.  Reviewed areas of pertinence such as fatigue, skin changes, breast tenderness, breast swelling, cough, shortness of breath, earaches and taste changes . Pt able to give teach back of to pat skin, use unscented/gentle soap and drink plenty of water,apply Radiaplex bid, avoid applying anything to skin within 4 hours of treatment, avoid wearing an under wire bra and to use an electric razor if they must shave. Pt verbalizes understanding of information given and will contact nursing with any questions or concerns.

## 2015-03-03 NOTE — Progress Notes (Signed)
Ms. Duan is here for her 3rd fraction to her right breast. She reports very mild fatigue, but is still able to walk twice a day. Her skin is intact. BP 116/76 mmHg  Pulse 95  Temp(Src) 98.8 F (37.1 C)  Ht 5' 2.5" (1.588 m)  Wt 119 lb 8 oz (54.205 kg)  BMI 21.50 kg/m2  Wt Readings from Last 3 Encounters:  03/03/15 119 lb 8 oz (54.205 kg)  02/19/15 120 lb 3.2 oz (54.522 kg)  02/11/15 117 lb 3.2 oz (53.162 kg)

## 2015-03-03 NOTE — Telephone Encounter (Signed)
Spoke to pt concerning needs during xrt. Relate doing well and without complaints. Discussed survivorship program and referral after xrt. Encourage pt to call with needs or questions. Received verbal understanding.

## 2015-03-04 ENCOUNTER — Encounter: Payer: Self-pay | Admitting: Emergency Medicine

## 2015-03-04 ENCOUNTER — Ambulatory Visit
Admission: RE | Admit: 2015-03-04 | Discharge: 2015-03-04 | Disposition: A | Discharge status: Home / Self Care | Payer: BC Managed Care – PPO | Source: Ambulatory Visit | Attending: Radiation Oncology | Admitting: Radiation Oncology

## 2015-03-04 DIAGNOSIS — Z510 Encounter for antineoplastic radiation therapy: Secondary | ICD-10-CM | POA: Diagnosis not present

## 2015-03-05 ENCOUNTER — Ambulatory Visit
Admission: RE | Admit: 2015-03-05 | Discharge: 2015-03-05 | Disposition: A | Discharge status: Home / Self Care | Payer: BC Managed Care – PPO | Source: Ambulatory Visit | Attending: Radiation Oncology | Admitting: Radiation Oncology

## 2015-03-05 DIAGNOSIS — Z510 Encounter for antineoplastic radiation therapy: Secondary | ICD-10-CM | POA: Diagnosis not present

## 2015-03-06 ENCOUNTER — Ambulatory Visit
Admission: RE | Admit: 2015-03-06 | Discharge: 2015-03-06 | Disposition: A | Discharge status: Home / Self Care | Payer: BC Managed Care – PPO | Source: Ambulatory Visit | Attending: Radiation Oncology | Admitting: Radiation Oncology

## 2015-03-06 DIAGNOSIS — Z510 Encounter for antineoplastic radiation therapy: Secondary | ICD-10-CM | POA: Diagnosis not present

## 2015-03-07 ENCOUNTER — Ambulatory Visit
Admission: RE | Admit: 2015-03-07 | Discharge: 2015-03-07 | Disposition: A | Discharge status: Home / Self Care | Payer: BC Managed Care – PPO | Source: Ambulatory Visit | Attending: Radiation Oncology | Admitting: Radiation Oncology

## 2015-03-07 DIAGNOSIS — Z510 Encounter for antineoplastic radiation therapy: Secondary | ICD-10-CM | POA: Diagnosis not present

## 2015-03-09 ENCOUNTER — Other Ambulatory Visit: Payer: Self-pay | Admitting: Emergency Medicine

## 2015-03-10 ENCOUNTER — Ambulatory Visit (HOSPITAL_BASED_OUTPATIENT_CLINIC_OR_DEPARTMENT_OTHER): Payer: BC Managed Care – PPO

## 2015-03-10 ENCOUNTER — Encounter: Payer: Self-pay | Admitting: Radiation Oncology

## 2015-03-10 ENCOUNTER — Other Ambulatory Visit (HOSPITAL_BASED_OUTPATIENT_CLINIC_OR_DEPARTMENT_OTHER): Payer: BC Managed Care – PPO

## 2015-03-10 ENCOUNTER — Ambulatory Visit
Admission: RE | Admit: 2015-03-10 | Discharge: 2015-03-10 | Disposition: A | Discharge status: Home / Self Care | Payer: BC Managed Care – PPO | Source: Ambulatory Visit | Attending: Radiation Oncology | Admitting: Radiation Oncology

## 2015-03-10 ENCOUNTER — Telehealth: Payer: Self-pay | Admitting: Oncology

## 2015-03-10 ENCOUNTER — Other Ambulatory Visit: Payer: Self-pay | Admitting: *Deleted

## 2015-03-10 VITALS — BP 110/74 | HR 81 | Temp 98.4°F | Resp 18

## 2015-03-10 VITALS — Wt 122.1 lb

## 2015-03-10 DIAGNOSIS — C50811 Malignant neoplasm of overlapping sites of right female breast: Secondary | ICD-10-CM

## 2015-03-10 DIAGNOSIS — C50211 Malignant neoplasm of upper-inner quadrant of right female breast: Secondary | ICD-10-CM

## 2015-03-10 DIAGNOSIS — Z5112 Encounter for antineoplastic immunotherapy: Secondary | ICD-10-CM

## 2015-03-10 DIAGNOSIS — Z510 Encounter for antineoplastic radiation therapy: Secondary | ICD-10-CM | POA: Diagnosis not present

## 2015-03-10 LAB — CBC WITH DIFFERENTIAL/PLATELET
BASO%: 0.4 % (ref 0.0–2.0)
BASOS ABS: 0 10*3/uL (ref 0.0–0.1)
EOS ABS: 0.1 10*3/uL (ref 0.0–0.5)
EOS%: 2.4 % (ref 0.0–7.0)
HEMATOCRIT: 35.8 % (ref 34.8–46.6)
HGB: 11.9 g/dL (ref 11.6–15.9)
LYMPH#: 1.3 10*3/uL (ref 0.9–3.3)
LYMPH%: 26.6 % (ref 14.0–49.7)
MCH: 34.6 pg — AB (ref 25.1–34.0)
MCHC: 33.2 g/dL (ref 31.5–36.0)
MCV: 104.1 fL — AB (ref 79.5–101.0)
MONO#: 0.5 10*3/uL (ref 0.1–0.9)
MONO%: 9.1 % (ref 0.0–14.0)
NEUT#: 3.1 10*3/uL (ref 1.5–6.5)
NEUT%: 61.5 % (ref 38.4–76.8)
PLATELETS: 190 10*3/uL (ref 145–400)
RBC: 3.44 10*6/uL — ABNORMAL LOW (ref 3.70–5.45)
RDW: 12.6 % (ref 11.2–14.5)
WBC: 5 10*3/uL (ref 3.9–10.3)

## 2015-03-10 LAB — COMPREHENSIVE METABOLIC PANEL (CC13)
ALT: 22 U/L (ref 0–55)
ANION GAP: 8 meq/L (ref 3–11)
AST: 26 U/L (ref 5–34)
Albumin: 3.8 g/dL (ref 3.5–5.0)
Alkaline Phosphatase: 74 U/L (ref 40–150)
BUN: 10.1 mg/dL (ref 7.0–26.0)
CALCIUM: 9.3 mg/dL (ref 8.4–10.4)
CHLORIDE: 103 meq/L (ref 98–109)
CO2: 28 mEq/L (ref 22–29)
CREATININE: 0.8 mg/dL (ref 0.6–1.1)
EGFR: 85 mL/min/{1.73_m2} — AB (ref >90)
Glucose: 104 mg/dl (ref 70–140)
POTASSIUM: 4.3 meq/L (ref 3.5–5.1)
Sodium: 139 mEq/L (ref 136–145)
Total Bilirubin: 0.42 mg/dL (ref 0.20–1.20)
Total Protein: 6.9 g/dL (ref 6.4–8.3)

## 2015-03-10 MED ORDER — HEPARIN SOD (PORK) LOCK FLUSH 100 UNIT/ML IV SOLN
500.0000 [IU] | Freq: Once | INTRAVENOUS | Status: AC | PRN
  Administered 2015-03-10: 500 [IU]
  Filled 2015-03-10: qty 5

## 2015-03-10 MED ORDER — SODIUM CHLORIDE 0.9 % IV SOLN
Freq: Once | INTRAVENOUS | Status: AC
  Administered 2015-03-10: 11:00:00 via INTRAVENOUS

## 2015-03-10 MED ORDER — DIPHENHYDRAMINE HCL 25 MG PO CAPS
25.0000 mg | ORAL_CAPSULE | Freq: Once | ORAL | Status: AC
  Administered 2015-03-10: 25 mg via ORAL

## 2015-03-10 MED ORDER — SODIUM CHLORIDE 0.9 % IV SOLN
6.0000 mg/kg | Freq: Once | INTRAVENOUS | Status: AC
  Administered 2015-03-10: 315 mg via INTRAVENOUS
  Filled 2015-03-10: qty 15

## 2015-03-10 MED ORDER — DIPHENHYDRAMINE HCL 25 MG PO CAPS
ORAL_CAPSULE | ORAL | Status: AC
  Filled 2015-03-10: qty 1

## 2015-03-10 MED ORDER — SODIUM CHLORIDE 0.9 % IJ SOLN
10.0000 mL | INTRAMUSCULAR | Status: DC | PRN
  Administered 2015-03-10: 10 mL
  Filled 2015-03-10: qty 10

## 2015-03-10 MED ORDER — ACETAMINOPHEN 325 MG PO TABS
ORAL_TABLET | ORAL | Status: AC
  Filled 2015-03-10: qty 2

## 2015-03-10 MED ORDER — ACETAMINOPHEN 325 MG PO TABS
650.0000 mg | ORAL_TABLET | Freq: Once | ORAL | Status: AC
  Administered 2015-03-10: 650 mg via ORAL

## 2015-03-10 NOTE — Progress Notes (Signed)
MD sen patient without nursing assessment

## 2015-03-10 NOTE — Telephone Encounter (Signed)
Appointments added per pof and avs pritned

## 2015-03-10 NOTE — Progress Notes (Signed)
   Weekly Management Note:  Outpatient    ICD-9-CM ICD-10-CM   1. Breast cancer of upper-inner quadrant of right female breast (HCC) 174.2 C50.211     Current Dose: 16 Gy  Projected Dose: 60 Gy   Narrative:  The patient presents for routine under treatment assessment.  CBCT/MVCT images/Port film x-rays were reviewed.  The chart was checked. No new complaints  Physical Findings:  weight is 122 lb 1.6 oz (55.384 kg).   Wt Readings from Last 3 Encounters:  03/10/15 122 lb 1.6 oz (55.384 kg)  03/03/15 119 lb 8 oz (54.205 kg)  02/19/15 120 lb 3.2 oz (54.522 kg)   Mild erythema over right breast  Impression:  The patient is tolerating radiotherapy.  Plan:  Continue radiotherapy as planned.    ________________________________   Eppie Gibson, M.D.

## 2015-03-10 NOTE — Patient Instructions (Signed)
[facility] Discharge Instructions for Patients Receiving Chemotherapy  Today you received the following chemotherapy agents:  Herceptin  To help prevent nausea and vomiting after your treatment, we encourage you to take your nausea medication as prescribed.   If you develop nausea and vomiting that is not controlled by your nausea medication, call the clinic.   BELOW ARE SYMPTOMS THAT SHOULD BE REPORTED IMMEDIATELY:  *FEVER GREATER THAN 100.5 F  *CHILLS WITH OR WITHOUT FEVER  NAUSEA AND VOMITING THAT IS NOT CONTROLLED WITH YOUR NAUSEA MEDICATION  *UNUSUAL SHORTNESS OF BREATH  *UNUSUAL BRUISING OR BLEEDING  TENDERNESS IN MOUTH AND THROAT WITH OR WITHOUT PRESENCE OF ULCERS  *URINARY PROBLEMS  *BOWEL PROBLEMS  UNUSUAL RASH Items with * indicate a potential emergency and should be followed up as soon as possible.  Feel free to call the clinic you have any questions or concerns. The clinic phone number is [phone].  Please show the CHEMO ALERT CARD at check-in to the Emergency Department and triage nurse.   

## 2015-03-11 ENCOUNTER — Ambulatory Visit
Admission: RE | Admit: 2015-03-11 | Discharge: 2015-03-11 | Disposition: A | Discharge status: Home / Self Care | Payer: BC Managed Care – PPO | Source: Ambulatory Visit | Attending: Radiation Oncology | Admitting: Radiation Oncology

## 2015-03-11 DIAGNOSIS — Z510 Encounter for antineoplastic radiation therapy: Secondary | ICD-10-CM | POA: Diagnosis not present

## 2015-03-11 NOTE — Telephone Encounter (Signed)
Called in.

## 2015-03-12 ENCOUNTER — Ambulatory Visit
Admission: RE | Admit: 2015-03-12 | Discharge: 2015-03-12 | Disposition: A | Discharge status: Home / Self Care | Payer: BC Managed Care – PPO | Source: Ambulatory Visit | Attending: Radiation Oncology | Admitting: Radiation Oncology

## 2015-03-12 DIAGNOSIS — Z510 Encounter for antineoplastic radiation therapy: Secondary | ICD-10-CM | POA: Diagnosis not present

## 2015-03-13 ENCOUNTER — Ambulatory Visit
Admission: RE | Admit: 2015-03-13 | Discharge: 2015-03-13 | Disposition: A | Discharge status: Home / Self Care | Payer: BC Managed Care – PPO | Source: Ambulatory Visit | Attending: Radiation Oncology | Admitting: Radiation Oncology

## 2015-03-13 DIAGNOSIS — Z510 Encounter for antineoplastic radiation therapy: Secondary | ICD-10-CM | POA: Diagnosis not present

## 2015-03-14 ENCOUNTER — Ambulatory Visit
Admission: RE | Admit: 2015-03-14 | Discharge: 2015-03-14 | Disposition: A | Discharge status: Home / Self Care | Payer: BC Managed Care – PPO | Source: Ambulatory Visit | Attending: Radiation Oncology | Admitting: Radiation Oncology

## 2015-03-14 DIAGNOSIS — Z510 Encounter for antineoplastic radiation therapy: Secondary | ICD-10-CM | POA: Diagnosis not present

## 2015-03-17 ENCOUNTER — Ambulatory Visit
Admission: RE | Admit: 2015-03-17 | Discharge: 2015-03-17 | Disposition: A | Discharge status: Home / Self Care | Payer: BC Managed Care – PPO | Source: Ambulatory Visit | Attending: Radiation Oncology | Admitting: Radiation Oncology

## 2015-03-17 VITALS — BP 104/74 | HR 80 | Temp 98.5°F | Wt 119.7 lb

## 2015-03-17 DIAGNOSIS — Z510 Encounter for antineoplastic radiation therapy: Secondary | ICD-10-CM | POA: Diagnosis not present

## 2015-03-17 DIAGNOSIS — C50211 Malignant neoplasm of upper-inner quadrant of right female breast: Secondary | ICD-10-CM

## 2015-03-17 NOTE — Progress Notes (Signed)
BP 104/74 mmHg  Pulse 80  Temp(Src) 98.5 F (36.9 C)  Wt 119 lb 11.2 oz (54.296 kg)  Weekly assessment of radiation to right GenitalDoctor.nl skin changes.denies pain.continue application of radiaplex twice daily.

## 2015-03-17 NOTE — Progress Notes (Signed)
   Weekly Management Note:  Outpatient    ICD-9-CM ICD-10-CM   1. Breast cancer of upper-inner quadrant of right female breast (HCC) 174.2 C50.211     Current Dose: 26 Gy  Projected Dose: 60 Gy   Narrative:  The patient presents for routine under treatment assessment.  CBCT/MVCT images/Port film x-rays were reviewed.  The chart was checked. No new complaints, doing well  Physical Findings:  weight is 119 lb 11.2 oz (54.296 kg). Her temperature is 98.5 F (36.9 C). Her blood pressure is 104/74 and her pulse is 80.   Wt Readings from Last 3 Encounters:  03/17/15 119 lb 11.2 oz (54.296 kg)  03/10/15 122 lb 1.6 oz (55.384 kg)  03/03/15 119 lb 8 oz (54.205 kg)   Mild erythema over right breast - no significant changes  Impression:  The patient is tolerating radiotherapy.  Plan:  Continue radiotherapy as planned.    ________________________________   Eppie Gibson, M.D.

## 2015-03-18 ENCOUNTER — Ambulatory Visit
Admission: RE | Admit: 2015-03-18 | Discharge: 2015-03-18 | Disposition: A | Discharge status: Home / Self Care | Payer: BC Managed Care – PPO | Source: Ambulatory Visit | Attending: Radiation Oncology | Admitting: Radiation Oncology

## 2015-03-18 DIAGNOSIS — Z510 Encounter for antineoplastic radiation therapy: Secondary | ICD-10-CM | POA: Diagnosis not present

## 2015-03-19 ENCOUNTER — Ambulatory Visit
Admission: RE | Admit: 2015-03-19 | Discharge: 2015-03-19 | Disposition: A | Discharge status: Home / Self Care | Payer: BC Managed Care – PPO | Source: Ambulatory Visit | Attending: Radiation Oncology | Admitting: Radiation Oncology

## 2015-03-19 DIAGNOSIS — Z510 Encounter for antineoplastic radiation therapy: Secondary | ICD-10-CM | POA: Diagnosis not present

## 2015-03-20 ENCOUNTER — Ambulatory Visit
Admission: RE | Admit: 2015-03-20 | Discharge: 2015-03-20 | Disposition: A | Discharge status: Home / Self Care | Payer: BC Managed Care – PPO | Source: Ambulatory Visit | Attending: Radiation Oncology | Admitting: Radiation Oncology

## 2015-03-20 DIAGNOSIS — Z510 Encounter for antineoplastic radiation therapy: Secondary | ICD-10-CM | POA: Diagnosis not present

## 2015-03-21 ENCOUNTER — Ambulatory Visit
Admission: RE | Admit: 2015-03-21 | Discharge: 2015-03-21 | Disposition: A | Discharge status: Home / Self Care | Payer: BC Managed Care – PPO | Source: Ambulatory Visit | Attending: Radiation Oncology | Admitting: Radiation Oncology

## 2015-03-21 DIAGNOSIS — Z510 Encounter for antineoplastic radiation therapy: Secondary | ICD-10-CM | POA: Diagnosis not present

## 2015-03-24 ENCOUNTER — Ambulatory Visit
Admission: RE | Admit: 2015-03-24 | Discharge: 2015-03-24 | Disposition: A | Discharge status: Home / Self Care | Payer: BC Managed Care – PPO | Source: Ambulatory Visit | Attending: Radiation Oncology | Admitting: Radiation Oncology

## 2015-03-24 ENCOUNTER — Encounter: Payer: Self-pay | Admitting: Radiation Oncology

## 2015-03-24 VITALS — BP 104/77 | HR 83 | Temp 98.2°F | Resp 18 | Ht 62.5 in | Wt 118.6 lb

## 2015-03-24 DIAGNOSIS — C50211 Malignant neoplasm of upper-inner quadrant of right female breast: Secondary | ICD-10-CM

## 2015-03-24 DIAGNOSIS — Z510 Encounter for antineoplastic radiation therapy: Secondary | ICD-10-CM | POA: Diagnosis not present

## 2015-03-24 MED ORDER — BIAFINE EX EMUL
Freq: Once | CUTANEOUS | Status: AC
  Administered 2015-03-24: 15:00:00 via TOPICAL

## 2015-03-24 NOTE — Addendum Note (Signed)
Encounter addended by: Jacqulyn Liner, RN on: 03/24/2015  2:26 PM<BR>     Documentation filed: [employer], Orders

## 2015-03-24 NOTE — Addendum Note (Signed)
Encounter addended by: Jacqulyn Liner, RN on: 03/24/2015  2:43 PM<BR>     Documentation filed: Medications, Inpatient [facility]

## 2015-03-24 NOTE — Progress Notes (Signed)
   Weekly Management Note:  Outpatient    ICD-9-CM ICD-10-CM   1. Breast cancer of upper-inner quadrant of right female breast (HCC) 174.2 C50.211     Current Dose: 36 Gy  Projected Dose: 60 Gy   Narrative:  The patient presents for routine under treatment assessment.  CBCT/MVCT images/Port film x-rays were reviewed.  The chart was checked.  Beth Rice has completed 18 fractions to her right breast.  She reports pain at 7/10 due to aching in her right underarm and breast.  She reports having fatigue.  She also reports feeling an area of firmness in her right upper breast.  She noticed it on Saturday.      Physical Findings:  height is 5' 2.5" (1.588 m) and weight is 118 lb 9.6 oz (53.797 kg). Her oral temperature is 98.2 F (36.8 C). Her blood pressure is 104/77 and her pulse is 83. Her respiration is 18.   Wt Readings from Last 3 Encounters:  03/24/15 118 lb 9.6 oz (53.797 kg)  03/17/15 119 lb 11.2 oz (54.296 kg)  03/10/15 122 lb 1.6 oz (55.384 kg)    erythema over right breast -  Area of concern to patient in [place] is consistent with palpable rib, no mass appreciated  Impression:  The patient is tolerating radiotherapy.  Plan:  Continue radiotherapy as planned.  reassurance given.  Try ibuprofen or aleve +/- tylenol for pain Biafene for skin ________________________________   Eppie Gibson, M.D.

## 2015-03-24 NOTE — Progress Notes (Signed)
Beth Rice has completed 18 fractions to her right breast.  She reports pain at 7/10 due to aching in her right underarm and breast.  She reports having fatigue.  She also reports feeling an area of firmness in her right upper breast.  She noticed it on Saturday.  On palpation, she has an area of cording in her right upper breast.  The skin on and under  her right breast is red.  BP 104/77 mmHg  Pulse 83  Temp(Src) 98.2 F (36.8 C) (Oral)  Resp 18  Ht 5' 2.5" (1.588 m)  Wt 118 lb 9.6 oz (53.797 kg)  BMI 21.33 kg/m2

## 2015-03-25 ENCOUNTER — Ambulatory Visit
Admission: RE | Admit: 2015-03-25 | Discharge: 2015-03-25 | Disposition: A | Discharge status: Home / Self Care | Payer: BC Managed Care – PPO | Source: Ambulatory Visit | Attending: Radiation Oncology | Admitting: Radiation Oncology

## 2015-03-25 DIAGNOSIS — Z510 Encounter for antineoplastic radiation therapy: Secondary | ICD-10-CM | POA: Diagnosis not present

## 2015-03-26 ENCOUNTER — Ambulatory Visit
Admission: RE | Admit: 2015-03-26 | Discharge: 2015-03-26 | Disposition: A | Discharge status: Home / Self Care | Payer: BC Managed Care – PPO | Source: Ambulatory Visit | Attending: Radiation Oncology | Admitting: Radiation Oncology

## 2015-03-26 DIAGNOSIS — Z510 Encounter for antineoplastic radiation therapy: Secondary | ICD-10-CM | POA: Diagnosis not present

## 2015-03-27 ENCOUNTER — Ambulatory Visit
Admission: RE | Admit: 2015-03-27 | Discharge: 2015-03-27 | Disposition: A | Discharge status: Home / Self Care | Payer: BC Managed Care – PPO | Source: Ambulatory Visit | Attending: Radiation Oncology | Admitting: Radiation Oncology

## 2015-03-27 DIAGNOSIS — Z510 Encounter for antineoplastic radiation therapy: Secondary | ICD-10-CM | POA: Diagnosis not present

## 2015-03-28 ENCOUNTER — Ambulatory Visit
Admission: RE | Admit: 2015-03-28 | Discharge: 2015-03-28 | Disposition: A | Discharge status: Home / Self Care | Payer: BC Managed Care – PPO | Source: Ambulatory Visit | Attending: Radiation Oncology | Admitting: Radiation Oncology

## 2015-03-28 ENCOUNTER — Encounter: Payer: Self-pay | Admitting: Radiation Oncology

## 2015-03-28 DIAGNOSIS — Z510 Encounter for antineoplastic radiation therapy: Secondary | ICD-10-CM | POA: Diagnosis not present

## 2015-03-31 ENCOUNTER — Other Ambulatory Visit (HOSPITAL_BASED_OUTPATIENT_CLINIC_OR_DEPARTMENT_OTHER): Payer: BC Managed Care – PPO

## 2015-03-31 ENCOUNTER — Ambulatory Visit (HOSPITAL_BASED_OUTPATIENT_CLINIC_OR_DEPARTMENT_OTHER): Payer: BC Managed Care – PPO

## 2015-03-31 ENCOUNTER — Ambulatory Visit
Admission: RE | Admit: 2015-03-31 | Discharge: 2015-03-31 | Disposition: A | Discharge status: Home / Self Care | Payer: BC Managed Care – PPO | Source: Ambulatory Visit | Attending: Radiation Oncology | Admitting: Radiation Oncology

## 2015-03-31 ENCOUNTER — Encounter: Payer: Self-pay | Admitting: *Deleted

## 2015-03-31 DIAGNOSIS — Z5112 Encounter for antineoplastic immunotherapy: Secondary | ICD-10-CM

## 2015-03-31 DIAGNOSIS — C50811 Malignant neoplasm of overlapping sites of right female breast: Secondary | ICD-10-CM

## 2015-03-31 DIAGNOSIS — C50211 Malignant neoplasm of upper-inner quadrant of right female breast: Secondary | ICD-10-CM

## 2015-03-31 DIAGNOSIS — Z510 Encounter for antineoplastic radiation therapy: Secondary | ICD-10-CM | POA: Diagnosis not present

## 2015-03-31 LAB — COMPREHENSIVE METABOLIC PANEL (CC13)
ALBUMIN: 3.8 g/dL (ref 3.5–5.0)
ALT: 22 U/L (ref 0–55)
ANION GAP: 7 meq/L (ref 3–11)
AST: 26 U/L (ref 5–34)
Alkaline Phosphatase: 81 U/L (ref 40–150)
BUN: 8.6 mg/dL (ref 7.0–26.0)
CALCIUM: 9.5 mg/dL (ref 8.4–10.4)
CO2: 31 meq/L — AB (ref 22–29)
CREATININE: 0.8 mg/dL (ref 0.6–1.1)
Chloride: 103 mEq/L (ref 98–109)
EGFR: 86 mL/min/{1.73_m2} — ABNORMAL LOW (ref >90)
Glucose: 110 mg/dl (ref 70–140)
Potassium: 3.8 mEq/L (ref 3.5–5.1)
Sodium: 140 mEq/L (ref 136–145)
TOTAL PROTEIN: 6.9 g/dL (ref 6.4–8.3)

## 2015-03-31 LAB — CBC WITH DIFFERENTIAL/PLATELET
BASO%: 0.7 % (ref 0.0–2.0)
Basophils Absolute: 0 10*3/uL (ref 0.0–0.1)
EOS%: 3.1 % (ref 0.0–7.0)
Eosinophils Absolute: 0.1 10*3/uL (ref 0.0–0.5)
HEMATOCRIT: 37 % (ref 34.8–46.6)
HEMOGLOBIN: 12.5 g/dL (ref 11.6–15.9)
LYMPH#: 1 10*3/uL (ref 0.9–3.3)
LYMPH%: 28.1 % (ref 14.0–49.7)
MCH: 34.9 pg — ABNORMAL HIGH (ref 25.1–34.0)
MCHC: 34 g/dL (ref 31.5–36.0)
MCV: 102.7 fL — ABNORMAL HIGH (ref 79.5–101.0)
MONO#: 0.3 10*3/uL (ref 0.1–0.9)
MONO%: 7.8 % (ref 0.0–14.0)
NEUT%: 60.3 % (ref 38.4–76.8)
NEUTROS ABS: 2.2 10*3/uL (ref 1.5–6.5)
PLATELETS: 228 10*3/uL (ref 145–400)
RBC: 3.6 10*6/uL — AB (ref 3.70–5.45)
RDW: 12.1 % (ref 11.2–14.5)
WBC: 3.6 10*3/uL — AB (ref 3.9–10.3)

## 2015-03-31 MED ORDER — SODIUM CHLORIDE 0.9 % IV SOLN
Freq: Once | INTRAVENOUS | Status: AC
  Administered 2015-03-31: 13:00:00 via INTRAVENOUS

## 2015-03-31 MED ORDER — SODIUM CHLORIDE 0.9 % IJ SOLN
10.0000 mL | INTRAMUSCULAR | Status: DC | PRN
Start: 2015-03-31 — End: 2015-03-31
  Administered 2015-03-31: 10 mL
  Filled 2015-03-31: qty 10

## 2015-03-31 MED ORDER — HEPARIN SOD (PORK) LOCK FLUSH 100 UNIT/ML IV SOLN
500.0000 [IU] | Freq: Once | INTRAVENOUS | Status: AC | PRN
  Administered 2015-03-31: 500 [IU]
  Filled 2015-03-31: qty 5

## 2015-03-31 MED ORDER — TRASTUZUMAB CHEMO INJECTION 440 MG
6.0000 mg/kg | Freq: Once | INTRAVENOUS | Status: AC
  Administered 2015-03-31: 315 mg via INTRAVENOUS
  Filled 2015-03-31: qty 15

## 2015-03-31 MED ORDER — DIPHENHYDRAMINE HCL 25 MG PO CAPS
ORAL_CAPSULE | ORAL | Status: AC
  Filled 2015-03-31: qty 2

## 2015-03-31 MED ORDER — DIPHENHYDRAMINE HCL 25 MG PO CAPS
25.0000 mg | ORAL_CAPSULE | Freq: Once | ORAL | Status: AC
  Administered 2015-03-31: 25 mg via ORAL

## 2015-03-31 MED ORDER — ACETAMINOPHEN 325 MG PO TABS
ORAL_TABLET | ORAL | Status: AC
  Filled 2015-03-31: qty 2

## 2015-03-31 MED ORDER — ACETAMINOPHEN 325 MG PO TABS
650.0000 mg | ORAL_TABLET | Freq: Once | ORAL | Status: AC
  Administered 2015-03-31: 650 mg via ORAL

## 2015-03-31 NOTE — Progress Notes (Signed)
   Weekly Management Note:  Outpatient    ICD-9-CM ICD-10-CM   1. Breast cancer of upper-inner quadrant of right female breast (HCC) 174.2 C50.211     Current Dose: 44 Gy  Projected Dose: 60 Gy   Narrative:  The patient presents for routine under treatment assessment.  CBCT/MVCT images/Port film x-rays were reviewed.  The chart was checked. She was seen in the [facility] vault and she asked to be able to go straight to med/onc after treatment today, due to infusion that is scheduled.  She was seen before fraction 23.  Doing well, no complaints   Physical Findings:  vitals were not taken for this visit.  Wt Readings from Last 3 Encounters:  03/24/15 118 lb 9.6 oz (53.797 kg)  03/17/15 119 lb 11.2 oz (54.296 kg)  03/10/15 122 lb 1.6 oz (55.384 kg)    erythema over right breast - skin intact  Impression:  The patient is tolerating radiotherapy.  Plan:  Continue radiotherapy as planned.    Biafine for skin. ________________________________   Eppie Gibson, M.D.

## 2015-04-01 ENCOUNTER — Ambulatory Visit
Admission: RE | Admit: 2015-04-01 | Discharge: 2015-04-01 | Disposition: A | Discharge status: Home / Self Care | Payer: BC Managed Care – PPO | Source: Ambulatory Visit | Attending: Radiation Oncology | Admitting: Radiation Oncology

## 2015-04-01 ENCOUNTER — Ambulatory Visit: Payer: BC Managed Care – PPO

## 2015-04-01 DIAGNOSIS — Z510 Encounter for antineoplastic radiation therapy: Secondary | ICD-10-CM | POA: Diagnosis not present

## 2015-04-02 ENCOUNTER — Ambulatory Visit: Payer: BC Managed Care – PPO

## 2015-04-02 ENCOUNTER — Ambulatory Visit
Admission: RE | Admit: 2015-04-02 | Discharge: 2015-04-02 | Disposition: A | Discharge status: Home / Self Care | Payer: BC Managed Care – PPO | Source: Ambulatory Visit | Attending: Radiation Oncology | Admitting: Radiation Oncology

## 2015-04-02 DIAGNOSIS — Z510 Encounter for antineoplastic radiation therapy: Secondary | ICD-10-CM | POA: Diagnosis not present

## 2015-04-03 ENCOUNTER — Ambulatory Visit: Payer: BC Managed Care – PPO

## 2015-04-03 ENCOUNTER — Ambulatory Visit
Admission: RE | Admit: 2015-04-03 | Discharge: 2015-04-03 | Disposition: A | Discharge status: Home / Self Care | Payer: BC Managed Care – PPO | Source: Ambulatory Visit | Attending: Radiation Oncology | Admitting: Radiation Oncology

## 2015-04-03 DIAGNOSIS — Z510 Encounter for antineoplastic radiation therapy: Secondary | ICD-10-CM | POA: Diagnosis not present

## 2015-04-04 ENCOUNTER — Ambulatory Visit: Payer: BC Managed Care – PPO

## 2015-04-04 ENCOUNTER — Ambulatory Visit
Admission: RE | Admit: 2015-04-04 | Discharge: 2015-04-04 | Disposition: A | Discharge status: Home / Self Care | Payer: BC Managed Care – PPO | Source: Ambulatory Visit | Attending: Radiation Oncology | Admitting: Radiation Oncology

## 2015-04-04 DIAGNOSIS — Z510 Encounter for antineoplastic radiation therapy: Secondary | ICD-10-CM | POA: Diagnosis not present

## 2015-04-07 ENCOUNTER — Ambulatory Visit
Admission: RE | Admit: 2015-04-07 | Discharge: 2015-04-07 | Disposition: A | Discharge status: Home / Self Care | Payer: BC Managed Care – PPO | Source: Ambulatory Visit | Attending: Radiation Oncology | Admitting: Radiation Oncology

## 2015-04-07 VITALS — BP 99/71 | HR 77 | Temp 98.5°F | Ht 62.5 in | Wt 119.3 lb

## 2015-04-07 DIAGNOSIS — C50211 Malignant neoplasm of upper-inner quadrant of right female breast: Secondary | ICD-10-CM | POA: Diagnosis present

## 2015-04-07 DIAGNOSIS — Z510 Encounter for antineoplastic radiation therapy: Secondary | ICD-10-CM | POA: Diagnosis not present

## 2015-04-07 MED ORDER — SONAFINE EX EMUL
1.0000 "application " | Freq: Once | CUTANEOUS | Status: AC
  Administered 2015-04-07: 1 via TOPICAL
  Filled 2015-04-07: qty 45

## 2015-04-07 NOTE — Addendum Note (Signed)
Encounter addended by: Ernst Spell, RN on: 04/07/2015 11:12 AM<BR>     Documentation filed: Inpatient MAR

## 2015-04-07 NOTE — Addendum Note (Signed)
Encounter addended by: Margaret Pyle, [place] on: 04/07/2015 10:53 AM<BR>     Documentation filed: Rx Order Verification

## 2015-04-07 NOTE — Progress Notes (Signed)
   Weekly Management Note:  Outpatient    ICD-9-CM ICD-10-CM   1. Breast cancer of upper-inner quadrant of right female breast (HCC) 174.2 C50.211 SONAFINE emulsion 1 application    Current Dose: 56 Gy  Projected Dose: 60 Gy   Narrative:  The patient presents for routine under treatment assessment.  CBCT/MVCT images/Port film x-rays were reviewed.  The chart was checked.   Doing well, no complaints. Some dry desquamation at right nipple   Physical Findings:  height is 5' 2.5" (1.588 m) and weight is 119 lb 4.8 oz (54.114 kg). Her temperature is 98.5 F (36.9 C). Her blood pressure is 99/71 and her pulse is 77.   Wt Readings from Last 3 Encounters:  04/07/15 119 lb 4.8 oz (54.114 kg)  03/24/15 118 lb 9.6 oz (53.797 kg)  03/17/15 119 lb 11.2 oz (54.296 kg)    erythema over right breast - dryness at right nipple where skin recently peeled per patient.  Impression:  The patient is tolerating radiotherapy.  Plan:  Continue radiotherapy as planned.      F/u in 75mo ________________________________   Eppie Gibson, M.D.

## 2015-04-07 NOTE — Progress Notes (Signed)
Ms. Nowack is here for her 28/30 fraction of radiation to her Right Breast. She reports she is eating well, but does admit to some fatigue and takes naps during the day at home. She reports drinking over a liter of fluid a day. Her Right breast is hyperpigmented and red in color. Her nipple is tender, red, and she reports her skin came off this area this morning, and some clear fluid came out. I do not observe any drainage at this time. She is using biafine cream as directed, and I will supply her with more today.  BP 99/71 mmHg  Pulse 77  Temp(Src) 98.5 F (36.9 C)  Ht 5' 2.5" (1.588 m)  Wt 119 lb 4.8 oz (54.114 kg)  BMI 21.46 kg/m2

## 2015-04-08 ENCOUNTER — Ambulatory Visit
Admission: RE | Admit: 2015-04-08 | Discharge: 2015-04-08 | Disposition: A | Discharge status: Home / Self Care | Payer: BC Managed Care – PPO | Source: Ambulatory Visit | Attending: Radiation Oncology | Admitting: Radiation Oncology

## 2015-04-08 ENCOUNTER — Other Ambulatory Visit: Payer: Self-pay | Admitting: Emergency Medicine

## 2015-04-08 ENCOUNTER — Ambulatory Visit: Payer: BC Managed Care – PPO

## 2015-04-08 DIAGNOSIS — Z510 Encounter for antineoplastic radiation therapy: Secondary | ICD-10-CM | POA: Diagnosis not present

## 2015-04-09 ENCOUNTER — Ambulatory Visit
Admission: RE | Admit: 2015-04-09 | Discharge: 2015-04-09 | Disposition: A | Discharge status: Home / Self Care | Payer: BC Managed Care – PPO | Source: Ambulatory Visit | Attending: Radiation Oncology | Admitting: Radiation Oncology

## 2015-04-09 ENCOUNTER — Encounter: Payer: Self-pay | Admitting: Radiation Oncology

## 2015-04-09 DIAGNOSIS — Z510 Encounter for antineoplastic radiation therapy: Secondary | ICD-10-CM | POA: Diagnosis not present

## 2015-04-09 NOTE — Telephone Encounter (Signed)
Faxed

## 2015-04-10 ENCOUNTER — Telehealth: Payer: Self-pay | Admitting: *Deleted

## 2015-04-10 NOTE — Telephone Encounter (Signed)
Called pt to congratulate on completion of chemo and needs. Relate doing well and with complaints. Confirmed herceptin treatment on 11/21 Discussed survivorship program and referral at the completion of herceptin.

## 2015-04-11 ENCOUNTER — Ambulatory Visit (INDEPENDENT_AMBULATORY_CARE_PROVIDER_SITE_OTHER): Payer: BC Managed Care – PPO | Admitting: Emergency Medicine

## 2015-04-11 ENCOUNTER — Encounter: Payer: Self-pay | Admitting: Emergency Medicine

## 2015-04-11 VITALS — BP 106/66 | HR 101 | Temp 98.5°F | Resp 16 | Ht 62.0 in | Wt 118.8 lb

## 2015-04-11 DIAGNOSIS — G4700 Insomnia, unspecified: Secondary | ICD-10-CM

## 2015-04-11 DIAGNOSIS — Z23 Encounter for immunization: Secondary | ICD-10-CM

## 2015-04-11 MED ORDER — ALPRAZOLAM 0.5 MG PO TABS: ORAL_TABLET | ORAL | Status: DC

## 2015-04-11 MED ORDER — CLONAZEPAM 1 MG PO TABS: 1.0000 mg | ORAL_TABLET | Freq: Every day | ORAL | Status: DC

## 2015-04-11 NOTE — Progress Notes (Addendum)
This chart was scribed for Arlyss Queen, MD by Moises Blood, Medical Scribe. This patient was seen in Room 21 and the patient's care was started 9:17 AM.  Chief Complaint:  Chief Complaint  Patient presents with  . pt would like to discuss new PCP  . Depression scale during triage    scoe 16, pt states she know it has to do with what she is going through    HPI: Beth Rice is a 59 y.o. female who reports to [facility] today.  PCP She is here for discuss new PCP as I am planning to retire Oct 2017.   Breast Cancer She just finished 30th radiation treatment.  After she finished her chemo, she feels like her depression is worse. She thinks about what has gone through. She has a support group that she goes to.   Depression She feels a little better, but not fully recovered. She tries to go walking but has some back pain. She uses a walker at home for mobility. She drinks ensure to keep her weight up.   Immunizations She was recommended for a flu shot today and will receive one.   Past Medical History  Diagnosis Date  . Anxiety   . Elevated cholesterol   . Depression   . Chest pain     a. 09/2008 Cath: ER 60%, LM nl, LAD 78m, 71m/d, LCX nondom, nl, RCA dom, 66m, PDA/PLA nl.  . Tobacco abuse   . H/O degenerative disc disease   . Headache     "frequency depends on the weather" (06/04/2014)  . Arthritis     "neck" (06/04/2014)  . Chronic lower back pain   . Gastroesophageal reflux disease     H/O  . Hot flashes   . Myocardial infarction [facility]) 2010    "I think I had a heart attack in 2010" cath showed small blockage- no treatment  . Cancer ([place]) 08/2014    Rt breast  . Anemia 12/2014    due to chemotherapy -received blood transfusion   Past Surgical History  Procedure Laterality Date  . Ovarian cyst removal  1980's?  . Tonsillectomy    . Cardiac catheterization  10/15/2008    noncritical mid & distal third anterior descending disease  . Portacath placement Left 09/17/2014      Procedure: INSERTION PORT-A-CATH;  Surgeon: [name], MD;  Location: [place];  Service: General;  Laterality: Left;  . Breast lumpectomy with radioactive seed and sentinel lymph node biopsy Right 01/21/2015    Procedure: BREAST LUMPECTOMY WITH RADIOACTIVE SEED AND SENTINEL LYMPH NODE BIOPSY;  Surgeon: [name], MD;  Location: [place];  Service: General;  Laterality: Right;   Social History   Social History  . Marital Status: Single    Spouse Name: N/A  . Number of Children: N/A  . Years of Education: N/A   Social History Main Topics  . Smoking status: Former Smoker -- 1.00 packs/day for 20 years    Types: Cigarettes    Quit date: 06/21/2014  . Smokeless tobacco: Never Used  . Alcohol Use: No  . Drug Use: No  . Sexual Activity: No     Comment: intercourse age 54, sexual partners less than 5   Other Topics Concern  . None   Social History Narrative   Lives in [place] with her 4 dogs.  She does not routinely exercise as activity is limited by chronic back pain.   Family History  Problem  Relation Age of Onset  . Hypertension Sister   . Heart disease Mother   . Alzheimer's disease Mother   . Cerebral aneurysm Father   . Mental illness Father   . Cancer Maternal Aunt 45    ovarian  . Cancer Other 38    mat great aunt (through [facility]) with colorectal cancer  . Cancer Other 50    mat great uncle (through [facility]) with prostate cancer  . Cancer Cousin 27    paternal female first cousin through an uncle with pancreatic cancer   Allergies  Allergen Reactions  . Penicillins Hives  . Statins     Patient has significant muscle cramps with statins. She cannot tolerate them.  . Sulfa Antibiotics Hives   Prior to Admission medications   Medication Sig Start Date End Date Taking? Authorizing Provider  acyclovir (ZOVIRAX) 400 MG tablet Take 1 tablet (400 mg total) by mouth 2 (two) times daily. 09/30/14   Chauncey Cruel, MD  ALPRAZolam Duanne Moron) 0.5  MG tablet TAKE 1 TABLET BY MOUTH EVERY 6 HOURS AS NEEDED FOR MUSCLE SPASMS 02/11/15   Chauncey Cruel, MD  aspirin EC 81 MG tablet Take 81 mg by mouth daily.    Historical Provider, MD  Calcium Carbonate-Vitamin D (CALCIUM + D PO) Take 1 tablet by mouth at bedtime.     Historical Provider, MD  clonazePAM (KLONOPIN) 1 MG tablet TAKE 1 TABLET BY MOUTH AT BEDTIME 04/09/15   Darlyne Russian, MD  emollient (BIAFINE) cream Apply topically as needed.    Historical Provider, MD  fluticasone (FLONASE) 50 MCG/ACT nasal spray Place 2 sprays into both nostrils daily. 06/18/14   Ezekiel Slocumb, PA-C  gabapentin (NEURONTIN) 100 MG capsule Take 1 capsule (100 mg total) by mouth 3 (three) times daily. Take 1-3 capsules up to three times daily 02/11/15   Chauncey Cruel, MD  hyaluronate sodium (RADIAPLEXRX) GEL Apply 1 application topically 2 (two) times daily.    Historical Provider, MD  loratadine (CLARITIN) 10 MG tablet Take 10 mg by mouth daily.    Historical Provider, MD  Lysine 500 MG CAPS Take 500 mg by mouth at bedtime.     Historical Provider, MD  Multiple Vitamins-Minerals (MULTIVITAMIN WITH MINERALS) tablet Take 1 tablet by mouth at bedtime.     Historical Provider, MD  non-metallic deodorant Jethro Poling) MISC Apply 1 application topically daily as needed.    Historical Provider, MD  omeprazole (PRILOSEC) 40 MG capsule Take 1 capsule (40 mg total) by mouth daily. 12/30/14   Laurie Panda, NP  sertraline (ZOLOFT) 100 MG tablet Take 1 tablet (100 mg total) by mouth daily. 02/11/15   Chauncey Cruel, MD     ROS:  Constitutional: negative for chills, fever, night sweats, weight changes, or fatigue  HEENT: negative for vision changes, hearing loss, congestion, rhinorrhea, ST, epistaxis, or sinus pressure Cardiovascular: negative for chest pain or palpitations Respiratory: negative for hemoptysis, wheezing, shortness of breath, or cough Abdominal: negative for abdominal pain, nausea, vomiting, diarrhea, or  constipation Dermatological: negative for rash Neurologic: negative for headache, dizziness, or syncope All other systems reviewed and are otherwise negative with the exception to those above and in the HPI.  PHYSICAL EXAM: Filed Vitals:   04/11/15 0910  BP: 106/66  Pulse: 101  Temp: 98.5 F (36.9 C)  Resp: 16   Body mass index is 21.72 kg/(m^2).   General: Alert, no acute distress; Loss of hair HEENT:  Normocephalic, atraumatic, oropharynx patent. Eye:  [facility], [facility] Cardiovascular:  Regular rate and rhythm, no rubs murmurs or gallops.  No Carotid bruits, radial pulse intact. No pedal edema.  Respiratory: Clear to auscultation bilaterally.  No wheezes, rales, or rhonchi.  No cyanosis, no use of accessory musculature Abdominal: No organomegaly, abdomen is soft and non-tender, positive bowel sounds. No masses.; radiation changes to skin of right breast without masses, port on left Musculoskeletal: Gait intact. No edema, tenderness Skin: No rashes. Neurologic: Facial musculature symmetric.; wide base gait, somewhat unsteady Psychiatric: Patient acts appropriately throughout our interaction.  Lymphatic: No cervical or submandibular lymphadenopathy Genitourinary/Anorectal: No acute findings  LABS:    EKG/XRAY:   Primary read interpreted by Dr. Everlene Farrier at [facility].   ASSESSMENT/PLAN:  I feel like the patient is doing great. She has finished radiation treatment and due to continuing her chemotherapy until April. She is going to support groups for her depression. Her neurological symptoms have improved. Her gait has improved. She is not smoking. She is going to follow-up with Dr. Edilia Bo as her PCP. Flu shot and Prevnar given today.  By signing my name below, I, Moises Blood, attest that this documentation has been prepared under the direction and in the presence of Arlyss Queen, MD. Electronically Signed: Moises Blood, [place]. 04/11/2015 , 9:17 AM .    Johney [place] sideeffects, risk and  benefits, and alternatives of medications d/w patient. Patient is aware that all medications have potential sideeffects and we are unable to predict every sideeffect or drug-drug interaction that may occur.  Arlyss Queen MD 04/11/2015 9:17 AM

## 2015-04-18 NOTE — Progress Notes (Signed)
  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 Right Breast. 50 Gy in 25 fractions.          2. Right Breast Boost. 10 Gy in 5 fractions  Beams/energy:  1. 3D-Conformal Other // 6X        2. Electron [employer] // 9 MeV  Narrative: The patient tolerated radiation treatment relatively well.  Plan: The patient has completed radiation treatment. The patient will return to radiation oncology clinic for routine followup in one month. I advised them to call or return sooner if they have any questions or concerns related to their recovery or treatment.  -----------------------------------  Eppie Gibson, MD  This document serves as a record of services personally performed by Eppie Gibson, MD. It was created on her behalf by Darcus Austin, a trained medical scribe. The creation of this record is based on the scribe's personal observations and the provider's statements to them. This document has been checked and approved by the attending provider.

## 2015-04-21 ENCOUNTER — Other Ambulatory Visit: Payer: Self-pay | Admitting: *Deleted

## 2015-04-21 ENCOUNTER — Ambulatory Visit (HOSPITAL_BASED_OUTPATIENT_CLINIC_OR_DEPARTMENT_OTHER): Payer: BC Managed Care – PPO

## 2015-04-21 ENCOUNTER — Other Ambulatory Visit: Payer: Self-pay | Admitting: Oncology

## 2015-04-21 VITALS — BP 111/67 | HR 87 | Temp 99.0°F | Resp 16

## 2015-04-21 DIAGNOSIS — Z5112 Encounter for antineoplastic immunotherapy: Secondary | ICD-10-CM | POA: Diagnosis not present

## 2015-04-21 DIAGNOSIS — C50811 Malignant neoplasm of overlapping sites of right female breast: Secondary | ICD-10-CM

## 2015-04-21 DIAGNOSIS — C50211 Malignant neoplasm of upper-inner quadrant of right female breast: Secondary | ICD-10-CM

## 2015-04-21 MED ORDER — TRASTUZUMAB CHEMO INJECTION 440 MG
6.0000 mg/kg | Freq: Once | INTRAVENOUS | Status: AC
  Administered 2015-04-21: 315 mg via INTRAVENOUS
  Filled 2015-04-21: qty 15

## 2015-04-21 MED ORDER — HEPARIN SOD (PORK) LOCK FLUSH 100 UNIT/ML IV SOLN
500.0000 [IU] | Freq: Once | INTRAVENOUS | Status: AC | PRN
  Administered 2015-04-21: 500 [IU]
  Filled 2015-04-21: qty 5

## 2015-04-21 MED ORDER — DIPHENHYDRAMINE HCL 25 MG PO CAPS
25.0000 mg | ORAL_CAPSULE | Freq: Once | ORAL | Status: AC
  Administered 2015-04-21: 25 mg via ORAL

## 2015-04-21 MED ORDER — DIPHENHYDRAMINE HCL 25 MG PO CAPS
ORAL_CAPSULE | ORAL | Status: AC
  Filled 2015-04-21: qty 1

## 2015-04-21 MED ORDER — ACETAMINOPHEN 325 MG PO TABS
650.0000 mg | ORAL_TABLET | Freq: Once | ORAL | Status: AC
  Administered 2015-04-21: 650 mg via ORAL

## 2015-04-21 MED ORDER — SODIUM CHLORIDE 0.9 % IV SOLN
Freq: Once | INTRAVENOUS | Status: AC
  Administered 2015-04-21: 10:00:00 via INTRAVENOUS

## 2015-04-21 MED ORDER — SODIUM CHLORIDE 0.9 % IJ SOLN
10.0000 mL | INTRAMUSCULAR | Status: DC | PRN
  Administered 2015-04-21: 10 mL
  Filled 2015-04-21: qty 10

## 2015-04-21 MED ORDER — ACETAMINOPHEN 325 MG PO TABS
ORAL_TABLET | ORAL | Status: AC
  Filled 2015-04-21: qty 2

## 2015-04-21 NOTE — Patient Instructions (Signed)
[facility] Discharge Instructions for Patients  Today you received the following: Herceptin   To help prevent nausea and vomiting after your treatment, we encourage you to take your nausea medication as directed.   If you develop nausea and vomiting that is not controlled by your nausea medication, call the clinic.   BELOW ARE SYMPTOMS THAT SHOULD BE REPORTED IMMEDIATELY:  *FEVER GREATER THAN 100.5 F  *CHILLS WITH OR WITHOUT FEVER  NAUSEA AND VOMITING THAT IS NOT CONTROLLED WITH YOUR NAUSEA MEDICATION  *UNUSUAL SHORTNESS OF BREATH  *UNUSUAL BRUISING OR BLEEDING  TENDERNESS IN MOUTH AND THROAT WITH OR WITHOUT PRESENCE OF ULCERS  *URINARY PROBLEMS  *BOWEL PROBLEMS  UNUSUAL RASH Items with * indicate a potential emergency and should be followed up as soon as possible.  Feel free to call the clinic you have any questions or concerns. The clinic phone number is [phone].  Please show the CHEMO ALERT CARD at check-in to the Emergency Department and triage nurse.   

## 2015-04-21 NOTE — Progress Notes (Signed)
Last Echo 01/08/15 50-55%, okay to use Echo and proceed with treatment today per Val RN, Dr. Virgie Dad nurse.

## 2015-04-23 NOTE — Progress Notes (Signed)
[employer] representative Note  03-28-15 Diagnosis: Breast Cancer   The patient's CT images from her initial simulation were reviewed to plan her boost treatment to her right breast  lumpectomy cavity.  Measurements were made regarding the size and depth of the surgical bed. The boost to the lumpectomy cavity will be delivered with 9 MeV electrons; 10 Gy in 5 fractions has been prescribed to the 100% isodose line.   A special port plan was reviewed and approved.  A custom electron cut-out will be used for her boost field.    -----------------------------------  Eppie Gibson, MD

## 2015-04-28 ENCOUNTER — Telehealth: Payer: Self-pay | Admitting: *Deleted

## 2015-04-28 NOTE — Telephone Encounter (Signed)
Pt called to r/s lab appt d/t she is out of town. Scheduled and confirmed new appt date and time for 12/6 at 1045

## 2015-04-30 ENCOUNTER — Telehealth: Payer: Self-pay | Admitting: Oncology

## 2015-04-30 NOTE — Telephone Encounter (Signed)
Spoke with patient re echo for 12/6. Also confirmed 12/6 lab and 12/8 GM/tx. Per Vaughan Basta echo preauth#[id number].

## 2015-05-01 ENCOUNTER — Other Ambulatory Visit: Payer: BC Managed Care – PPO

## 2015-05-06 ENCOUNTER — Other Ambulatory Visit: Payer: Self-pay | Admitting: Oncology

## 2015-05-06 ENCOUNTER — Other Ambulatory Visit (HOSPITAL_BASED_OUTPATIENT_CLINIC_OR_DEPARTMENT_OTHER): Payer: BC Managed Care – PPO

## 2015-05-06 ENCOUNTER — Ambulatory Visit (HOSPITAL_COMMUNITY)
Admission: RE | Admit: 2015-05-06 | Discharge: 2015-05-06 | Disposition: A | Discharge status: Home / Self Care | Payer: BC Managed Care – PPO | Source: Ambulatory Visit | Attending: Oncology | Admitting: Oncology

## 2015-05-06 DIAGNOSIS — F32A Depression, unspecified: Secondary | ICD-10-CM

## 2015-05-06 DIAGNOSIS — G4700 Insomnia, unspecified: Secondary | ICD-10-CM | POA: Diagnosis not present

## 2015-05-06 DIAGNOSIS — F329 Major depressive disorder, single episode, unspecified: Secondary | ICD-10-CM | POA: Diagnosis not present

## 2015-05-06 DIAGNOSIS — C50211 Malignant neoplasm of upper-inner quadrant of right female breast: Secondary | ICD-10-CM

## 2015-05-06 LAB — CBC WITH DIFFERENTIAL/PLATELET
BASO%: 0.8 % (ref 0.0–2.0)
Basophils Absolute: 0 10*3/uL (ref 0.0–0.1)
EOS ABS: 0.2 10*3/uL (ref 0.0–0.5)
EOS%: 3.8 % (ref 0.0–7.0)
HCT: 38.7 % (ref 34.8–46.6)
HEMOGLOBIN: 13.2 g/dL (ref 11.6–15.9)
LYMPH#: 0.9 10*3/uL (ref 0.9–3.3)
LYMPH%: 22.3 % (ref 14.0–49.7)
MCH: 34.3 pg — ABNORMAL HIGH (ref 25.1–34.0)
MCHC: 34.1 g/dL (ref 31.5–36.0)
MCV: 100.4 fL (ref 79.5–101.0)
MONO#: 0.4 10*3/uL (ref 0.1–0.9)
MONO%: 10.3 % (ref 0.0–14.0)
NEUT%: 62.8 % (ref 38.4–76.8)
NEUTROS ABS: 2.6 10*3/uL (ref 1.5–6.5)
PLATELETS: 231 10*3/uL (ref 145–400)
RBC: 3.85 10*6/uL (ref 3.70–5.45)
RDW: 12 % (ref 11.2–14.5)
WBC: 4.1 10*3/uL (ref 3.9–10.3)

## 2015-05-06 LAB — COMPREHENSIVE METABOLIC PANEL
ALBUMIN: 3.8 g/dL (ref 3.5–5.0)
ALK PHOS: 87 U/L (ref 40–150)
ALT: 26 U/L (ref 0–55)
ANION GAP: 9 meq/L (ref 3–11)
AST: 33 U/L (ref 5–34)
BILIRUBIN TOTAL: 0.4 mg/dL (ref 0.20–1.20)
BUN: 10.5 mg/dL (ref 7.0–26.0)
CO2: 29 meq/L (ref 22–29)
CREATININE: 0.8 mg/dL (ref 0.6–1.1)
Calcium: 9.9 mg/dL (ref 8.4–10.4)
Chloride: 102 mEq/L (ref 98–109)
EGFR: 78 mL/min/{1.73_m2} — ABNORMAL LOW (ref >90)
GLUCOSE: 99 mg/dL (ref 70–140)
Potassium: 4 mEq/L (ref 3.5–5.1)
Sodium: 140 mEq/L (ref 136–145)
TOTAL PROTEIN: 7.4 g/dL (ref 6.4–8.3)

## 2015-05-06 NOTE — Progress Notes (Signed)
Echocardiogram 2D Echocardiogram limited has been performed.  Beth Rice 05/06/2015, 11:41 AM

## 2015-05-08 ENCOUNTER — Telehealth: Payer: Self-pay | Admitting: Oncology

## 2015-05-08 ENCOUNTER — Ambulatory Visit (HOSPITAL_BASED_OUTPATIENT_CLINIC_OR_DEPARTMENT_OTHER): Payer: BC Managed Care – PPO | Admitting: Oncology

## 2015-05-08 VITALS — BP 104/71 | HR 96 | Temp 98.6°F | Resp 18 | Ht 62.0 in | Wt 120.0 lb

## 2015-05-08 DIAGNOSIS — C50211 Malignant neoplasm of upper-inner quadrant of right female breast: Secondary | ICD-10-CM

## 2015-05-08 DIAGNOSIS — Z170 Estrogen receptor positive status [ER+]: Secondary | ICD-10-CM

## 2015-05-08 DIAGNOSIS — C50411 Malignant neoplasm of upper-outer quadrant of right female breast: Secondary | ICD-10-CM

## 2015-05-08 NOTE — Telephone Encounter (Signed)
Appointments made and patient will get a new avs at 12/12 chemo

## 2015-05-08 NOTE — Progress Notes (Signed)
[place]  [phone] [phone]     ID: Beth Rice DOB: 1955-11-03  MR#: [id number]  BLT#:[id number]  Patient Care Team: Beth Russian, MD as PCP - General (Family Medicine) Beth Klein, MD as Consulting Physician (General Surgery) Beth Cruel, MD as Consulting Physician (Oncology) Beth Gibson, MD as Attending Physician (Radiation Oncology) Beth Germany, RN as Registered Nurse Beth Kaufmann, RN as Registered Nurse PCP: Beth Reichmann, MD OTHER MD: Beth Kato MD  CHIEF COMPLAINT: HER-2 positive breast cancer  CURRENT TREATMENT: anti-HER-2 immunotherapy  BREAST CANCER HISTORY: From the original intake note:  Beth Rice herself palpated a mass in her right breast but "didn't think much about it". She saw Dr. Phineas Rice for routine gynecologic follow-up and he also palpated and immediately set her up for right diagnostic mammography with tomosynthesis and ultrasonography at the breast Center 08/27/2014. In the upper outer quadrant of the right breast there was a spiculated mass accompanied by calcifications, the complex extending up to 2.5 cm. This was palpable and mobile. It measured approximately 2 cm by palpation. There was no palpable right axillary adenopathy. Ultrasound of the right axilla also showed normal axillary contents.  Ultrasound of the right breast confirmed an irregularly marginated hypoechoic mass measuring 1.8 cm. Biopsy was not immediately performed because of patient was on aspirin at the time. On 08/30/2014 Beth Rice underwent biopsy of the mass in question, with the pathology ([facility] [id number]) (showing an invasive ductal carcinoma, grade 2, estrogen receptor 2% positive with moderate staining intensity, progesterone receptor negative, with an MIB-1 of 81% and with HER-2 amplified, the signals ratio being 2.87 and the number per cell 4.30.  On 09/03/2024 she underwent bilateral breast MRI. This found the breast composition to  be category C. In the right breast there was a spiculated mass measuring 2.3 cm in close proximity to the pectoralis but without obvious invasion of the muscle. The rest of the breast, the left breast, and the lymph node areas well otherwise unremarkable.  Her subsequent history is as detailed below   INTERVAL HISTORY: Beth Rice returns today for follow up of her HER-2 positive breast cancer. She continues on trastuzumab every 3 weeks. She tolerates this well. She feels a little bit tired for a few days after each dose but has no other side effects.  REVIEW OF SYSTEMS: Beth Rice had a wonderful Thanksgiving's with her family. She is looking forward to Christmas and again at her sisters. She did "good" with radiation, with some fatigue and some desquamation which is largely resolved. Her hair is coming in straight and thick. She feels isolated and would like to go back to work if that were possible. She wonders if she should change antidepressants. It isn't giving her any "energy". She has some arthritis symptoms which are not more intense or persistent than before. She is walking 3 times a day up to 3-4 miles daily, which is very favorable. A detailed review of systems today was otherwise stable  PAST MEDICAL HISTORY: Past Medical History  Diagnosis Date  . Anxiety   . Elevated cholesterol   . Depression   . Chest pain     a. 09/2008 Cath: ER 60%, LM nl, LAD 72m, 57m/d, LCX nondom, nl, RCA dom, 67m, PDA/PLA nl.  . Tobacco abuse   . H/O degenerative disc disease   . Headache     "frequency depends on the weather" (06/04/2014)  . Arthritis     "neck" (06/04/2014)  .  Chronic lower back pain   . Gastroesophageal reflux disease     H/O  . Hot flashes   . Myocardial infarction [facility]) 2010    "I think I had a heart attack in 2010" cath showed small blockage- no treatment  . Cancer ([place]) 08/2014    Rt breast  . Anemia 12/2014    due to chemotherapy -received blood transfusion    PAST SURGICAL  HISTORY: Past Surgical History  Procedure Laterality Date  . Ovarian cyst removal  1980's?  . Tonsillectomy    . Cardiac catheterization  10/15/2008    noncritical mid & distal third anterior descending disease  . Portacath placement Left 09/17/2014    Procedure: INSERTION PORT-A-CATH;  Surgeon: Beth Klein, MD;  Location: [place];  Service: General;  Laterality: Left;  . Breast lumpectomy with radioactive seed and sentinel lymph node biopsy Right 01/21/2015    Procedure: BREAST LUMPECTOMY WITH RADIOACTIVE SEED AND SENTINEL LYMPH NODE BIOPSY;  Surgeon: Beth Klein, MD;  Location: [place];  Service: General;  Laterality: Right;    FAMILY HISTORY Family History  Problem Relation Age of Onset  . Hypertension Sister   . Heart disease Mother   . Alzheimer's disease Mother   . Cerebral aneurysm Father   . Mental illness Father   . Cancer Maternal Aunt 45    ovarian  . Cancer Other 15    mat great aunt (through [facility]) with colorectal cancer  . Cancer Other 35    mat great uncle (through [facility]) with prostate cancer  . Cancer Cousin 51    paternal female first cousin through an uncle with pancreatic cancer  The patient's father died at the age of 52 after having cerebral hemorrhages and age 50 and 39. The patient's mother died at the age of 42 with Parkinson's and Alzheimer's disease. The patient's mother had one sister and this sister was diagnosed with ovarian cancer in her early 73s. There is no other history of breast or ovarian cancer in the family.   GYNECOLOGIC HISTORY:  No LMP recorded. Patient is postmenopausal.  menarche age 49, the patient is GX P0. She stopped having periods in 2005. She did not take hormone replacement.   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 She is in process of retiring, with targeted retirement date in June. She is single, lives alone with 4  dogs.    ADVANCED DIRECTIVES: Not in place. She tells me she plans to name her sister Beth Rice as her healthcare power of attorney. Beth Rice (who is a Marine scientist) can be reached in [place] at [phone] (cell) or [phone] (home).   HEALTH MAINTENANCE: Social History  Substance Use Topics  . Smoking status: Former Smoker -- 1.00 packs/day for 20 years    Types: Cigarettes    Quit date: 06/21/2014  . Smokeless tobacco: Never Used  . Alcohol Use: No     Colonoscopy: 2006/[facility]  SPQ:ZRAQT 2016   B[facility] density:09/10/[facility]/normal   Lipid panel:  Allergies  Allergen Reactions  . Penicillins Hives  . Statins     Patient has significant muscle cramps with statins. She cannot tolerate them.  . Sulfa Antibiotics Hives    Current Outpatient Prescriptions  Medication Sig Dispense Refill  . acyclovir (ZOVIRAX) 400 MG tablet Take 1 tablet (400 mg total) by mouth 2 (two) times daily. 60 tablet 3  . ALPRAZolam (XANAX) 0.5 MG  tablet Take 1 tablet every morning 30 tablet 2  . aspirin EC 81 MG tablet Take 81 mg by mouth daily.    . Calcium Carbonate-Vitamin D (CALCIUM + D PO) Take 1 tablet by mouth at bedtime.     . clonazePAM (KLONOPIN) 1 MG tablet Take 1 tablet (1 mg total) by mouth at bedtime. 30 tablet 5  . emollient (BIAFINE) cream Apply topically as needed.    . fluticasone (FLONASE) 50 MCG/ACT nasal spray Place 2 sprays into both nostrils daily. 16 g 11  . gabapentin (NEURONTIN) 100 MG capsule Take 1 capsule (100 mg total) by mouth 3 (three) times daily. Take 1-3 capsules up to three times daily 60 capsule 2  . hyaluronate sodium (RADIAPLEXRX) GEL Apply 1 application topically 2 (two) times daily.    Marland Kitchen loratadine (CLARITIN) 10 MG tablet Take 10 mg by mouth daily.    Marland Kitchen Lysine 500 MG CAPS Take 500 mg by mouth at bedtime.     . Multiple Vitamins-Minerals (MULTIVITAMIN WITH MINERALS) tablet Take 1 tablet by mouth at bedtime.     . non-metallic deodorant  Jethro Poling) MISC Apply 1 application topically daily as needed.    Marland Kitchen omeprazole (PRILOSEC) 40 MG capsule Take 1 capsule (40 mg total) by mouth daily. 30 capsule 2  . sertraline (ZOLOFT) 100 MG tablet Take 1 tablet (100 mg total) by mouth daily. 30 tablet 3   No current facility-administered medications for this visit.    OBJECTIVE: middle-aged white woman who appears stated age 30 Vitals:   05/08/15 0928  BP: 104/71  Pulse: 96  Temp: 98.6 F (37 C)  Resp: 18     Body mass index is 21.94 kg/(m^2).    ECOG FS:0 - Asymptomatic  Sclerae unicteric, pupils round and equal Oropharynx clear and moist-- no thrush or other lesions No cervical or supraclavicular adenopathy Lungs no rales or rhonchi Heart regular rate and rhythm Abd soft, nontender, positive bowel sounds MSK no focal spinal tenderness, no upper extremity lymphedema Neuro: nonfocal, well oriented, appropriate affect Breasts: The right breast is status post lumpectomy and radiation. There is minimal residual hyperpigmentation. The breast is firmer than the left breast, with no discrete mass. There is no nipple inversion. The right axilla is benign per the left breast is unremarkable.    LAB RESULTS:  CMP     Component Value Date/Time   NA 140 05/06/2015 0942   NA 139 06/18/2014 1133   K 4.0 05/06/2015 0942   K 4.2 06/18/2014 1133   CL 103 06/18/2014 1133   CO2 29 05/06/2015 0942   CO2 27 06/18/2014 1133   GLUCOSE 99 05/06/2015 0942   GLUCOSE 93 06/18/2014 1133   BUN 10.5 05/06/2015 0942   BUN 7 06/18/2014 1133   CREATININE 0.8 05/06/2015 0942   CREATININE 0.73 06/18/2014 1133   CREATININE 0.64 06/03/2014 1203   CALCIUM 9.9 05/06/2015 0942   CALCIUM 9.3 06/18/2014 1133   PROT 7.4 05/06/2015 0942   PROT 6.8 10/23/2013 1030   ALBUMIN 3.8 05/06/2015 0942   ALBUMIN 4.5 10/23/2013 1030   AST 33 05/06/2015 0942   AST 25 10/23/2013 1030   ALT 26 05/06/2015 0942   ALT 16 10/23/2013 1030   ALKPHOS 87 05/06/2015 0942    ALKPHOS 68 10/23/2013 1030   BILITOT 0.40 05/06/2015 0942   BILITOT 0.7 10/23/2013 1030   GFRNONAA >90 06/03/2014 1203   GFRNONAA >89 10/23/2013 1030   GFRAA >90 06/03/2014 1203   GFRAA >89 10/23/2013  [place] results found for: SPEP, UPEP  Lab Results  Component Value Date   WBC 4.1 05/06/2015   NEUTROABS 2.6 05/06/2015   HGB 13.2 05/06/2015   HCT 38.7 05/06/2015   MCV 100.4 05/06/2015   PLT 231 05/06/2015      Chemistry      Component Value Date/Time   NA 140 05/06/2015 0942   NA 139 06/18/2014 1133   K 4.0 05/06/2015 0942   K 4.2 06/18/2014 1133   CL 103 06/18/2014 1133   CO2 29 05/06/2015 0942   CO2 27 06/18/2014 1133   BUN 10.5 05/06/2015 0942   BUN 7 06/18/2014 1133   CREATININE 0.8 05/06/2015 0942   CREATININE 0.73 06/18/2014 1133   CREATININE 0.64 06/03/2014 1203      Component Value Date/Time   CALCIUM 9.9 05/06/2015 0942   CALCIUM 9.3 06/18/2014 1133   ALKPHOS 87 05/06/2015 0942   ALKPHOS 68 10/23/2013 1030   AST 33 05/06/2015 0942   AST 25 10/23/2013 1030   ALT 26 05/06/2015 0942   ALT 16 10/23/2013 1030   BILITOT 0.40 05/06/2015 0942   BILITOT 0.7 10/23/2013 1030       No results found for: LABCA2  No components found for: LABCA125  No results for input(s): INR in the last 168 hours.  Urinalysis    Component Value Date/Time   COLORURINE YELLOW 12/23/2014 1335   APPEARANCEUR CLEAR 12/23/2014 1335   LABSPEC 1.007 12/23/2014 1335   PHURINE 6.0 12/23/2014 1335   GLUCOSEU NEGATIVE 12/23/2014 1335   HGBUR NEGATIVE 12/23/2014 1335   BILIRUBINUR NEGATIVE 12/23/2014 1335   BILIRUBINUR neg 06/18/2014 1103   KETONESUR NEGATIVE 12/23/2014 1335   PROTEINUR NEGATIVE 12/23/2014 1335   PROTEINUR neg 06/18/2014 1103   UROBILINOGEN 0.2 12/23/2014 1335   UROBILINOGEN 0.2 06/18/2014 1103   NITRITE NEGATIVE 12/23/2014 1335   NITRITE neg 06/18/2014 1103   LEUKOCYTESUR NEGATIVE 12/23/2014 1335    STUDIES:  Transthoracic  Echocardiography  Patient:  Urvi, Imes MR #:    [id number] Study Date: 05/06/2015 Gender:   F Age:    59 Height:   157.5 cm Weight:   54.4 kg BSA:    1.55 m^2 Pt. Status: Room:  ATTENDING  Beth Rice, Valli Glance   [place] SONOGRAPHER Tresa Res, RDCS PERFORMING  [facility], Outpatient  cc:  ------------------------------------------------------------------- LV EF: 50%  ASSESSMENT: 59 y.o. [place] woman status post right breast biopsy 08/30/2014 for a clinical T2 N0, stage IIA invasive ductal carcinoma, grade 2, estrogen receptor 2% "positive," progesterone receptor negative, HER-2 positive, with an MIB-1 of 81%.  (1) neoadjuvant chemotherapy consisting of carboplatin, docetaxel, trastuzumab and pertuzumab x 6  beginning 09/23/14, completed 01/06/2015   (a) docetaxel switched to gemcitabine for cycles 5 and 6 because of acute bilateral lower extremity edema  (2) trastuzumab will be continued to complete 1 year (through April 2017)  (a) most recent echo 05/06/2015 showed an ejection fraction of 50% (stable).  (3) status post right lumpectomy and sentinel lymph node sampling 01/21/2015 showing a complete pathologic response  (4) adjuvant radiation 02/27/2015-04/09/2015:  Right Breast. 50 Gy in 25 fractions. Right Breast Boost. 10 Gy in 5 fractions  (5) considered anti-estrogens but opted against them  (6) genetics testing pending  PLAN: Dilynn is doing very well as far as her breast cancer is concerned, and the plan is to continue Herceptin every 21 days until mid April.  Her echocardiogram remains  in the normal range. We will repeat this again late March.  She thinks she needs to have her antidepressant adjusted or changed because she does not have a lot of energy. I think most antidepressants really don't provide energy. They provide some mood stabilization at best. What will give her energy  is what she is doing, namely an excellent walking and exercise program. I think it would be useful also she had a project. She is under employed. I suggested she meet with our social workers here and our Engineer, site and see what ideas she may have regarding projects she may be able to participate in.  I also gave her information on our tai chi and yoga programs. Finally I suggested she participate in the finding your new normal program.  We discussed tamoxifen and anastrozole. Her breast cancer was minimally estrogen receptor positive. I really don't think her prognosis would be significantly improved by taking those medications. A better reason to consider anti-estrogens in her case is prophylactic. They will cut in half the risk of her developing breast cancer in either breast in the future, from about 1% per year to about a half percent per year. We discussed the possible toxicities, side effects and complications of these agents. After much discussion she is comfortable not taking these medications on a preventative basis.  She knows to call for any problems that may develop before her next visit here.    Beth Cruel, MD   05/08/2015 9:42 AM

## 2015-05-12 ENCOUNTER — Ambulatory Visit (HOSPITAL_BASED_OUTPATIENT_CLINIC_OR_DEPARTMENT_OTHER): Payer: BC Managed Care – PPO

## 2015-05-12 ENCOUNTER — Other Ambulatory Visit: Payer: BC Managed Care – PPO

## 2015-05-12 VITALS — BP 105/65 | HR 96 | Temp 98.9°F | Resp 18

## 2015-05-12 DIAGNOSIS — C50411 Malignant neoplasm of upper-outer quadrant of right female breast: Secondary | ICD-10-CM | POA: Diagnosis not present

## 2015-05-12 DIAGNOSIS — Z5112 Encounter for antineoplastic immunotherapy: Secondary | ICD-10-CM

## 2015-05-12 DIAGNOSIS — C50211 Malignant neoplasm of upper-inner quadrant of right female breast: Secondary | ICD-10-CM

## 2015-05-12 MED ORDER — ACETAMINOPHEN 325 MG PO TABS
ORAL_TABLET | ORAL | Status: AC
  Filled 2015-05-12: qty 2

## 2015-05-12 MED ORDER — HEPARIN SOD (PORK) LOCK FLUSH 100 UNIT/ML IV SOLN
500.0000 [IU] | Freq: Once | INTRAVENOUS | Status: AC | PRN
  Administered 2015-05-12: 500 [IU]
  Filled 2015-05-12: qty 5

## 2015-05-12 MED ORDER — DIPHENHYDRAMINE HCL 25 MG PO CAPS
25.0000 mg | ORAL_CAPSULE | Freq: Once | ORAL | Status: AC
  Administered 2015-05-12: 25 mg via ORAL

## 2015-05-12 MED ORDER — ACETAMINOPHEN 325 MG PO TABS
650.0000 mg | ORAL_TABLET | Freq: Once | ORAL | Status: AC
  Administered 2015-05-12: 650 mg via ORAL

## 2015-05-12 MED ORDER — DIPHENHYDRAMINE HCL 25 MG PO CAPS
ORAL_CAPSULE | ORAL | Status: AC
  Filled 2015-05-12: qty 1

## 2015-05-12 MED ORDER — TRASTUZUMAB CHEMO INJECTION 440 MG
6.0000 mg/kg | Freq: Once | INTRAVENOUS | Status: AC
  Administered 2015-05-12: 315 mg via INTRAVENOUS
  Filled 2015-05-12: qty 15

## 2015-05-12 MED ORDER — SODIUM CHLORIDE 0.9 % IJ SOLN
10.0000 mL | INTRAMUSCULAR | Status: DC | PRN
  Administered 2015-05-12: 10 mL
  Filled 2015-05-12: qty 10

## 2015-05-12 MED ORDER — SODIUM CHLORIDE 0.9 % IV SOLN
Freq: Once | INTRAVENOUS | Status: AC
  Administered 2015-05-12: 12:00:00 via INTRAVENOUS

## 2015-05-12 NOTE — Patient Instructions (Signed)
[facility] Discharge Instructions for Patients Receiving Chemotherapy  Today you received the following chemotherapy agents Herceptin    If you develop nausea and vomiting that is not controlled by your nausea medication, call the clinic.   BELOW ARE SYMPTOMS THAT SHOULD BE REPORTED IMMEDIATELY:  *FEVER GREATER THAN 100.5 F  *CHILLS WITH OR WITHOUT FEVER  NAUSEA AND VOMITING THAT IS NOT CONTROLLED WITH YOUR NAUSEA MEDICATION  *UNUSUAL SHORTNESS OF BREATH  *UNUSUAL BRUISING OR BLEEDING  TENDERNESS IN MOUTH AND THROAT WITH OR WITHOUT PRESENCE OF ULCERS  *URINARY PROBLEMS  *BOWEL PROBLEMS  UNUSUAL RASH Items with * indicate a potential emergency and should be followed up as soon as possible.  Feel free to call the clinic you have any questions or concerns. The clinic phone number is [phone].  Please show the CHEMO ALERT CARD at check-in to the Emergency Department and triage nurse.   

## 2015-05-16 ENCOUNTER — Encounter: Payer: Self-pay | Admitting: Radiation Oncology

## 2015-05-16 ENCOUNTER — Ambulatory Visit
Admission: RE | Admit: 2015-05-16 | Discharge: 2015-05-16 | Disposition: A | Discharge status: Home / Self Care | Payer: BC Managed Care – PPO | Source: Ambulatory Visit | Attending: Radiation Oncology | Admitting: Radiation Oncology

## 2015-05-16 ENCOUNTER — Telehealth: Payer: Self-pay | Admitting: Oncology

## 2015-05-16 VITALS — BP 111/71 | HR 92 | Temp 98.3°F | Ht 62.0 in | Wt 121.1 lb

## 2015-05-16 DIAGNOSIS — C50211 Malignant neoplasm of upper-inner quadrant of right female breast: Secondary | ICD-10-CM

## 2015-05-16 NOTE — Progress Notes (Addendum)
Beth Rice is here for follow up of radiation completed 04/09/15 to her Right Breast. She reports some fatigue and is napping daily and goes to bed earlier at times. She denies pain, but does report occasional "shooting pains" in her breast. She has some peeling to the upper medial area of her Right Breast. There are no open areas observed. She is using the radiaplex cream to this area daily at night. She is receiving Herceptin every 3 weeks until April in medical oncology.  BP 111/71 mmHg  Pulse 92  Temp(Src) 98.3 F (36.8 C)  Ht 5\' 2"  (1.575 m)  Wt 121 lb 1.6 oz (54.931 kg)  BMI 22.14 kg/m2   Wt Readings from Last 3 Encounters:  05/16/15 121 lb 1.6 oz (54.931 kg)  05/08/15 120 lb (54.432 kg)  04/11/15 118 lb 12.8 oz (53.887 kg)

## 2015-05-16 NOTE — Progress Notes (Signed)
Radiation Oncology         [phone] ________________________________  Name: Beth Rice MRN: [id number]  Date: 05/16/2015  DOB: 1955/12/15  Follow-Up Visit Note  Outpatient  CC: DAUB, Lina Sayre, MD  Magrinat, Virgie Dad, MD  Diagnosis and Prior Radiotherapy: Stage II [id number] Right Breast UIQ Invasive Ductal Carcinoma, ER2% / PR0% / Her2+, Grade 1-2; [id number] Right breast cancer    ICD-9-CM ICD-10-CM   1. Breast cancer of upper-inner quadrant of right female breast ([employer]) 174.2 C50.211    Radiation treatment dates:   02/27/2015-04/09/2015 Site/dose:  1. Right Breast. 50 Gy in 25 fractions.          2. Right Breast Boost. 10 Gy in 5 fractions   Narrative:  The patient returns today for routine follow-up of radiation completed 04/09/15 to her Right Breast. She reports some fatigue and is napping daily and goes to bed earlier at times. She denies pain, but does report occasional "shooting pains" in her breast.   She is using the radiaplex cream to the breast daily at night. She is receiving Herceptin every 3 weeks until April in medical oncology. She last saw Dr. Jana Hakim last week.                               ALLERGIES:  is allergic to penicillins; statins; and sulfa antibiotics.  Meds: Current Outpatient Prescriptions  Medication Sig Dispense Refill  . acyclovir (ZOVIRAX) 400 MG tablet Take 1 tablet (400 mg total) by mouth 2 (two) times daily. 60 tablet 3  . ALPRAZolam (XANAX) 0.5 MG tablet Take 1 tablet every morning 30 tablet 2  . aspirin EC 81 MG tablet Take 81 mg by mouth daily.    . Calcium Carbonate-Vitamin D (CALCIUM + D PO) Take 1 tablet by mouth at bedtime.     . clonazePAM (KLONOPIN) 1 MG tablet Take 1 tablet (1 mg total) by mouth at bedtime. 30 tablet 5  . fluticasone (FLONASE) 50 MCG/ACT nasal spray Place 2 sprays into both nostrils daily. 16 g 11  . gabapentin (NEURONTIN) 100 MG capsule Take 1 capsule (100 mg total) by mouth 3 (three) times daily. Take 1-3 capsules  up to three times daily 60 capsule 2  . loratadine (CLARITIN) 10 MG tablet Take 10 mg by mouth daily.    Marland Kitchen Lysine 500 MG CAPS Take 500 mg by mouth at bedtime.     . Multiple Vitamins-Minerals (MULTIVITAMIN WITH MINERALS) tablet Take 1 tablet by mouth at bedtime.     . sertraline (ZOLOFT) 100 MG tablet Take 1 tablet (100 mg total) by mouth daily. 30 tablet 3  . omeprazole (PRILOSEC) 40 MG capsule Take 1 capsule (40 mg total) by mouth daily. (Patient not taking: Reported on 05/16/2015) 30 capsule 2   No current facility-administered medications for this encounter.    Physical Findings: The patient is in no acute distress. Patient is alert and oriented.  height is _0  (1.575 m) and weight is 121 lb 1.6 oz (54.931 kg). Her temperature is 98.3 F (36.8 C). Her blood pressure is 111/71 and her pulse is 92. .    Right breast is still mildly hyperpigmented with minimal amount of dry peeling skin in the UIQ. No open areas and no moist desquamation. There is some fullness adjacent to the lumpectomy scar consistent with scar tissue from previous treatment. Right inframammary fold in clear.   Lab Findings: Lab Results  Component Value Date   WBC 4.1 05/06/2015   HGB 13.2 05/06/2015   HCT 38.7 05/06/2015   MCV 100.4 05/06/2015   PLT 231 05/06/2015    Radiographic Findings: No results found.  Impression/Plan:  Healing well. She continues to follow closely with Dr. Jana Hakim. I encouraged her to continue with yearly mammography and followup with medical oncology. I will see her back on an as-needed basis. I have encouraged her to call if she has any issues or concerns in the future. I wished her the very best. Recommend vitamin E oil or lotion/cream for further healing of skin.  _____________________________________   Eppie Gibson, MD   This document serves as a record of services personally performed by Eppie Gibson, MD. It was created on her behalf by Arlyce Harman, a trained medical  scribe. The creation of this record is based on the scribe's personal observations and the provider's statements to them. This document has been checked and approved by the attending provider.

## 2015-05-16 NOTE — Telephone Encounter (Signed)
Called and left a message with echo appointment °

## 2015-05-17 ENCOUNTER — Encounter: Payer: Self-pay | Admitting: Emergency Medicine

## 2015-05-27 ENCOUNTER — Other Ambulatory Visit: Payer: Self-pay | Admitting: Nurse Practitioner

## 2015-05-29 ENCOUNTER — Ambulatory Visit (HOSPITAL_BASED_OUTPATIENT_CLINIC_OR_DEPARTMENT_OTHER): Payer: BC Managed Care – PPO

## 2015-05-29 DIAGNOSIS — Z5111 Encounter for antineoplastic chemotherapy: Secondary | ICD-10-CM

## 2015-05-29 DIAGNOSIS — C50211 Malignant neoplasm of upper-inner quadrant of right female breast: Secondary | ICD-10-CM

## 2015-05-29 DIAGNOSIS — C50411 Malignant neoplasm of upper-outer quadrant of right female breast: Secondary | ICD-10-CM | POA: Diagnosis not present

## 2015-05-29 MED ORDER — HEPARIN SOD (PORK) LOCK FLUSH 100 UNIT/ML IV SOLN
500.0000 [IU] | Freq: Once | INTRAVENOUS | Status: AC | PRN
  Administered 2015-05-29: 500 [IU]
  Filled 2015-05-29: qty 5

## 2015-05-29 MED ORDER — SODIUM CHLORIDE 0.9 % IV SOLN
Freq: Once | INTRAVENOUS | Status: AC
  Administered 2015-05-29: 12:00:00 via INTRAVENOUS

## 2015-05-29 MED ORDER — DIPHENHYDRAMINE HCL 25 MG PO CAPS
ORAL_CAPSULE | ORAL | Status: AC
  Filled 2015-05-29: qty 1

## 2015-05-29 MED ORDER — ACETAMINOPHEN 325 MG PO TABS
650.0000 mg | ORAL_TABLET | Freq: Once | ORAL | Status: AC
  Administered 2015-05-29: 650 mg via ORAL

## 2015-05-29 MED ORDER — ACETAMINOPHEN 325 MG PO TABS
ORAL_TABLET | ORAL | Status: AC
  Filled 2015-05-29: qty 2

## 2015-05-29 MED ORDER — DIPHENHYDRAMINE HCL 25 MG PO CAPS
25.0000 mg | ORAL_CAPSULE | Freq: Once | ORAL | Status: AC
  Administered 2015-05-29: 25 mg via ORAL

## 2015-05-29 MED ORDER — TRASTUZUMAB CHEMO INJECTION 440 MG
6.0000 mg/kg | Freq: Once | INTRAVENOUS | Status: AC
  Administered 2015-05-29: 315 mg via INTRAVENOUS
  Filled 2015-05-29: qty 15

## 2015-05-29 MED ORDER — SODIUM CHLORIDE 0.9 % IJ SOLN
10.0000 mL | INTRAMUSCULAR | Status: DC | PRN
  Administered 2015-05-29: 10 mL
  Filled 2015-05-29: qty 10

## 2015-05-29 NOTE — Patient Instructions (Signed)
Cancer Center Discharge Instructions for Patients Receiving Chemotherapy  Today you received the following chemotherapy agents Herceptin  To help prevent nausea and vomiting after your treatment, we encourage you to take your nausea medication    If you develop nausea and vomiting that is not controlled by your nausea medication, call the clinic.   BELOW ARE SYMPTOMS THAT SHOULD BE REPORTED IMMEDIATELY:  *FEVER GREATER THAN 100.5 F  *CHILLS WITH OR WITHOUT FEVER  NAUSEA AND VOMITING THAT IS NOT CONTROLLED WITH YOUR NAUSEA MEDICATION  *UNUSUAL SHORTNESS OF BREATH  *UNUSUAL BRUISING OR BLEEDING  TENDERNESS IN MOUTH AND THROAT WITH OR WITHOUT PRESENCE OF ULCERS  *URINARY PROBLEMS  *BOWEL PROBLEMS  UNUSUAL RASH Items with * indicate a potential emergency and should be followed up as soon as possible.  Feel free to call the clinic you have any questions or concerns. The clinic phone number is [phone].  Please show the CHEMO ALERT CARD at check-in to the Emergency Department and triage nurse.   

## 2015-05-30 ENCOUNTER — Other Ambulatory Visit: Payer: Self-pay | Admitting: *Deleted

## 2015-05-30 DIAGNOSIS — F32A Depression, unspecified: Secondary | ICD-10-CM

## 2015-05-30 DIAGNOSIS — F329 Major depressive disorder, single episode, unspecified: Secondary | ICD-10-CM

## 2015-05-30 MED ORDER — SERTRALINE HCL 100 MG PO TABS: 100.0000 mg | ORAL_TABLET | Freq: Every day | ORAL | Status: DC

## 2015-06-19 ENCOUNTER — Ambulatory Visit (HOSPITAL_BASED_OUTPATIENT_CLINIC_OR_DEPARTMENT_OTHER): Payer: BC Managed Care – PPO

## 2015-06-19 ENCOUNTER — Other Ambulatory Visit (HOSPITAL_BASED_OUTPATIENT_CLINIC_OR_DEPARTMENT_OTHER): Payer: BC Managed Care – PPO

## 2015-06-19 VITALS — BP 104/68 | HR 76 | Temp 98.3°F | Resp 16

## 2015-06-19 DIAGNOSIS — C50211 Malignant neoplasm of upper-inner quadrant of right female breast: Secondary | ICD-10-CM

## 2015-06-19 DIAGNOSIS — C50411 Malignant neoplasm of upper-outer quadrant of right female breast: Secondary | ICD-10-CM | POA: Diagnosis not present

## 2015-06-19 DIAGNOSIS — Z5112 Encounter for antineoplastic immunotherapy: Secondary | ICD-10-CM

## 2015-06-19 LAB — COMPREHENSIVE METABOLIC PANEL
ALBUMIN: 3.7 g/dL (ref 3.5–5.0)
ALK PHOS: 81 U/L (ref 40–150)
ALT: 24 U/L (ref 0–55)
AST: 27 U/L (ref 5–34)
Anion Gap: 8 mEq/L (ref 3–11)
BILIRUBIN TOTAL: 0.42 mg/dL (ref 0.20–1.20)
BUN: 11.5 mg/dL (ref 7.0–26.0)
CALCIUM: 9.4 mg/dL (ref 8.4–10.4)
CO2: 30 mEq/L — ABNORMAL HIGH (ref 22–29)
Chloride: 102 mEq/L (ref 98–109)
Creatinine: 0.8 mg/dL (ref 0.6–1.1)
EGFR: 78 mL/min/{1.73_m2} — ABNORMAL LOW (ref >90)
Glucose: 101 mg/dl (ref 70–140)
POTASSIUM: 4.4 meq/L (ref 3.5–5.1)
Sodium: 140 mEq/L (ref 136–145)
TOTAL PROTEIN: 7.2 g/dL (ref 6.4–8.3)

## 2015-06-19 LAB — CBC WITH DIFFERENTIAL/PLATELET
BASO%: 0.4 % (ref 0.0–2.0)
BASOS ABS: 0 10*3/uL (ref 0.0–0.1)
EOS ABS: 0.3 10*3/uL (ref 0.0–0.5)
EOS%: 6.1 % (ref 0.0–7.0)
HEMATOCRIT: 36.6 % (ref 34.8–46.6)
HEMOGLOBIN: 12.5 g/dL (ref 11.6–15.9)
LYMPH#: 1.3 10*3/uL (ref 0.9–3.3)
LYMPH%: 26.7 % (ref 14.0–49.7)
MCH: 33.9 pg (ref 25.1–34.0)
MCHC: 34.2 g/dL (ref 31.5–36.0)
MCV: 99.2 fL (ref 79.5–101.0)
MONO#: 0.4 10*3/uL (ref 0.1–0.9)
MONO%: 8.6 % (ref 0.0–14.0)
NEUT%: 58.2 % (ref 38.4–76.8)
NEUTROS ABS: 2.8 10*3/uL (ref 1.5–6.5)
Platelets: 222 10*3/uL (ref 145–400)
RBC: 3.69 10*6/uL — ABNORMAL LOW (ref 3.70–5.45)
RDW: 12.6 % (ref 11.2–14.5)
WBC: 4.8 10*3/uL (ref 3.9–10.3)

## 2015-06-19 MED ORDER — SODIUM CHLORIDE 0.9 % IJ SOLN
10.0000 mL | INTRAMUSCULAR | Status: DC | PRN
  Administered 2015-06-19: 10 mL
  Filled 2015-06-19: qty 10

## 2015-06-19 MED ORDER — ACETAMINOPHEN 325 MG PO TABS
ORAL_TABLET | ORAL | Status: AC
  Filled 2015-06-19: qty 2

## 2015-06-19 MED ORDER — DIPHENHYDRAMINE HCL 25 MG PO CAPS
25.0000 mg | ORAL_CAPSULE | Freq: Once | ORAL | Status: AC
  Administered 2015-06-19: 25 mg via ORAL

## 2015-06-19 MED ORDER — TRASTUZUMAB CHEMO INJECTION 440 MG
6.0000 mg/kg | Freq: Once | INTRAVENOUS | Status: AC
  Administered 2015-06-19: 315 mg via INTRAVENOUS
  Filled 2015-06-19: qty 15

## 2015-06-19 MED ORDER — HEPARIN SOD (PORK) LOCK FLUSH 100 UNIT/ML IV SOLN
500.0000 [IU] | Freq: Once | INTRAVENOUS | Status: AC | PRN
  Administered 2015-06-19: 500 [IU]
  Filled 2015-06-19: qty 5

## 2015-06-19 MED ORDER — DIPHENHYDRAMINE HCL 25 MG PO CAPS
ORAL_CAPSULE | ORAL | Status: AC
  Filled 2015-06-19: qty 1

## 2015-06-19 MED ORDER — SODIUM CHLORIDE 0.9 % IV SOLN
Freq: Once | INTRAVENOUS | Status: AC
  Administered 2015-06-19: 11:00:00 via INTRAVENOUS

## 2015-06-19 MED ORDER — ACETAMINOPHEN 325 MG PO TABS
650.0000 mg | ORAL_TABLET | Freq: Once | ORAL | Status: AC
  Administered 2015-06-19: 650 mg via ORAL

## 2015-06-19 NOTE — Patient Instructions (Signed)
[facility] Discharge Instructions for Patients Receiving Chemotherapy  Today you received the following chemotherapy agents herceptin   To help prevent nausea and vomiting after your treatment, we encourage you to take your nausea medication as directed   If you develop nausea and vomiting that is not controlled by your nausea medication, call the clinic.   BELOW ARE SYMPTOMS THAT SHOULD BE REPORTED IMMEDIATELY:  *FEVER GREATER THAN 100.5 F  *CHILLS WITH OR WITHOUT FEVER  NAUSEA AND VOMITING THAT IS NOT CONTROLLED WITH YOUR NAUSEA MEDICATION  *UNUSUAL SHORTNESS OF BREATH  *UNUSUAL BRUISING OR BLEEDING  TENDERNESS IN MOUTH AND THROAT WITH OR WITHOUT PRESENCE OF ULCERS  *URINARY PROBLEMS  *BOWEL PROBLEMS  UNUSUAL RASH Items with * indicate a potential emergency and should be followed up as soon as possible.  Feel free to call the clinic you have any questions or concerns. The clinic phone number is [phone].  

## 2015-06-26 ENCOUNTER — Telehealth: Payer: Self-pay | Admitting: Family Medicine

## 2015-06-26 NOTE — Telephone Encounter (Signed)
lmom for pt to call and reschedule her appt that she had with [facility] in April with another provider

## 2015-06-27 DIAGNOSIS — Z0271 Encounter for disability determination: Secondary | ICD-10-CM

## 2015-07-10 ENCOUNTER — Other Ambulatory Visit (HOSPITAL_BASED_OUTPATIENT_CLINIC_OR_DEPARTMENT_OTHER): Payer: BC Managed Care – PPO

## 2015-07-10 ENCOUNTER — Ambulatory Visit (HOSPITAL_BASED_OUTPATIENT_CLINIC_OR_DEPARTMENT_OTHER): Payer: BC Managed Care – PPO

## 2015-07-10 VITALS — BP 116/72 | HR 89 | Temp 98.2°F | Resp 18

## 2015-07-10 DIAGNOSIS — C50211 Malignant neoplasm of upper-inner quadrant of right female breast: Secondary | ICD-10-CM

## 2015-07-10 DIAGNOSIS — Z5112 Encounter for antineoplastic immunotherapy: Secondary | ICD-10-CM

## 2015-07-10 DIAGNOSIS — C50411 Malignant neoplasm of upper-outer quadrant of right female breast: Secondary | ICD-10-CM | POA: Diagnosis not present

## 2015-07-10 LAB — CBC WITH DIFFERENTIAL/PLATELET
BASO%: 0.8 % (ref 0.0–2.0)
Basophils Absolute: 0 10*3/uL (ref 0.0–0.1)
EOS ABS: 0.1 10*3/uL (ref 0.0–0.5)
EOS%: 1 % (ref 0.0–7.0)
HEMATOCRIT: 38.6 % (ref 34.8–46.6)
HEMOGLOBIN: 13.1 g/dL (ref 11.6–15.9)
LYMPH#: 1 10*3/uL (ref 0.9–3.3)
LYMPH%: 21.2 % (ref 14.0–49.7)
MCH: 34.3 pg — ABNORMAL HIGH (ref 25.1–34.0)
MCHC: 33.9 g/dL (ref 31.5–36.0)
MCV: 101 fL (ref 79.5–101.0)
MONO#: 0.4 10*3/uL (ref 0.1–0.9)
MONO%: 7.3 % (ref 0.0–14.0)
NEUT%: 69.7 % (ref 38.4–76.8)
NEUTROS ABS: 3.4 10*3/uL (ref 1.5–6.5)
PLATELETS: 235 10*3/uL (ref 145–400)
RBC: 3.82 10*6/uL (ref 3.70–5.45)
RDW: 13.2 % (ref 11.2–14.5)
WBC: 4.9 10*3/uL (ref 3.9–10.3)

## 2015-07-10 LAB — COMPREHENSIVE METABOLIC PANEL
ALBUMIN: 3.9 g/dL (ref 3.5–5.0)
ALK PHOS: 84 U/L (ref 40–150)
ALT: 22 U/L (ref 0–55)
ANION GAP: 11 meq/L (ref 3–11)
AST: 27 U/L (ref 5–34)
BILIRUBIN TOTAL: 0.41 mg/dL (ref 0.20–1.20)
BUN: 12 mg/dL (ref 7.0–26.0)
CALCIUM: 9.8 mg/dL (ref 8.4–10.4)
CO2: 28 meq/L (ref 22–29)
CREATININE: 0.8 mg/dL (ref 0.6–1.1)
Chloride: 102 mEq/L (ref 98–109)
EGFR: 75 mL/min/{1.73_m2} — AB (ref >90)
Glucose: 101 mg/dl (ref 70–140)
Potassium: 4.3 mEq/L (ref 3.5–5.1)
Sodium: 141 mEq/L (ref 136–145)
TOTAL PROTEIN: 7.6 g/dL (ref 6.4–8.3)

## 2015-07-10 MED ORDER — ACETAMINOPHEN 325 MG PO TABS
650.0000 mg | ORAL_TABLET | Freq: Once | ORAL | Status: AC
  Administered 2015-07-10: 650 mg via ORAL

## 2015-07-10 MED ORDER — DIPHENHYDRAMINE HCL 25 MG PO CAPS
ORAL_CAPSULE | ORAL | Status: AC
  Filled 2015-07-10: qty 1

## 2015-07-10 MED ORDER — ACETAMINOPHEN 325 MG PO TABS
ORAL_TABLET | ORAL | Status: AC
  Filled 2015-07-10: qty 2

## 2015-07-10 MED ORDER — DIPHENHYDRAMINE HCL 25 MG PO CAPS
25.0000 mg | ORAL_CAPSULE | Freq: Once | ORAL | Status: AC
  Administered 2015-07-10: 25 mg via ORAL

## 2015-07-10 MED ORDER — SODIUM CHLORIDE 0.9 % IJ SOLN
10.0000 mL | INTRAMUSCULAR | Status: DC | PRN
  Administered 2015-07-10: 10 mL
  Filled 2015-07-10: qty 10

## 2015-07-10 MED ORDER — SODIUM CHLORIDE 0.9 % IV SOLN
Freq: Once | INTRAVENOUS | Status: AC
  Administered 2015-07-10: 11:00:00 via INTRAVENOUS

## 2015-07-10 MED ORDER — HEPARIN SOD (PORK) LOCK FLUSH 100 UNIT/ML IV SOLN
500.0000 [IU] | Freq: Once | INTRAVENOUS | Status: AC | PRN
  Administered 2015-07-10: 500 [IU]
  Filled 2015-07-10: qty 5

## 2015-07-10 MED ORDER — TRASTUZUMAB CHEMO INJECTION 440 MG
6.0000 mg/kg | Freq: Once | INTRAVENOUS | Status: AC
  Administered 2015-07-10: 315 mg via INTRAVENOUS
  Filled 2015-07-10: qty 15

## 2015-07-10 NOTE — Patient Instructions (Signed)
[facility] Discharge Instructions for Patients Receiving Chemotherapy  Today you received the following chemotherapy agents herceptin   To help prevent nausea and vomiting after your treatment, we encourage you to take your nausea medication as directed   If you develop nausea and vomiting that is not controlled by your nausea medication, call the clinic.   BELOW ARE SYMPTOMS THAT SHOULD BE REPORTED IMMEDIATELY:  *FEVER GREATER THAN 100.5 F  *CHILLS WITH OR WITHOUT FEVER  NAUSEA AND VOMITING THAT IS NOT CONTROLLED WITH YOUR NAUSEA MEDICATION  *UNUSUAL SHORTNESS OF BREATH  *UNUSUAL BRUISING OR BLEEDING  TENDERNESS IN MOUTH AND THROAT WITH OR WITHOUT PRESENCE OF ULCERS  *URINARY PROBLEMS  *BOWEL PROBLEMS  UNUSUAL RASH Items with * indicate a potential emergency and should be followed up as soon as possible.  Feel free to call the clinic you have any questions or concerns. The clinic phone number is [phone].  

## 2015-07-31 ENCOUNTER — Other Ambulatory Visit (HOSPITAL_BASED_OUTPATIENT_CLINIC_OR_DEPARTMENT_OTHER): Payer: BC Managed Care – PPO

## 2015-07-31 ENCOUNTER — Ambulatory Visit (HOSPITAL_BASED_OUTPATIENT_CLINIC_OR_DEPARTMENT_OTHER): Payer: BC Managed Care – PPO

## 2015-07-31 VITALS — BP 116/69 | HR 80 | Temp 99.2°F | Resp 18

## 2015-07-31 DIAGNOSIS — C50211 Malignant neoplasm of upper-inner quadrant of right female breast: Secondary | ICD-10-CM

## 2015-07-31 DIAGNOSIS — C50411 Malignant neoplasm of upper-outer quadrant of right female breast: Secondary | ICD-10-CM

## 2015-07-31 DIAGNOSIS — Z5111 Encounter for antineoplastic chemotherapy: Secondary | ICD-10-CM

## 2015-07-31 LAB — COMPREHENSIVE METABOLIC PANEL
ALK PHOS: 85 U/L (ref 40–150)
ALT: 23 U/L (ref 0–55)
AST: 30 U/L (ref 5–34)
Albumin: 3.9 g/dL (ref 3.5–5.0)
Anion Gap: 10 mEq/L (ref 3–11)
BUN: 13.6 mg/dL (ref 7.0–26.0)
CO2: 27 meq/L (ref 22–29)
Calcium: 9.6 mg/dL (ref 8.4–10.4)
Chloride: 102 mEq/L (ref 98–109)
Creatinine: 0.9 mg/dL (ref 0.6–1.1)
EGFR: 71 mL/min/{1.73_m2} — AB (ref >90)
GLUCOSE: 107 mg/dL (ref 70–140)
POTASSIUM: 4.2 meq/L (ref 3.5–5.1)
SODIUM: 140 meq/L (ref 136–145)
Total Bilirubin: <0.3 mg/dL (ref 0.20–1.20)
Total Protein: 7.6 g/dL (ref 6.4–8.3)

## 2015-07-31 LAB — CBC WITH DIFFERENTIAL/PLATELET
BASO%: 0.6 % (ref 0.0–2.0)
BASOS ABS: 0 10*3/uL (ref 0.0–0.1)
EOS ABS: 0.1 10*3/uL (ref 0.0–0.5)
EOS%: 1.3 % (ref 0.0–7.0)
HCT: 39.2 % (ref 34.8–46.6)
HGB: 13.3 g/dL (ref 11.6–15.9)
LYMPH%: 29.4 % (ref 14.0–49.7)
MCH: 33.9 pg (ref 25.1–34.0)
MCHC: 33.9 g/dL (ref 31.5–36.0)
MCV: 100 fL (ref 79.5–101.0)
MONO#: 0.3 10*3/uL (ref 0.1–0.9)
MONO%: 6.5 % (ref 0.0–14.0)
NEUT#: 3 10*3/uL (ref 1.5–6.5)
NEUT%: 62.2 % (ref 38.4–76.8)
Platelets: 207 10*3/uL (ref 145–400)
RBC: 3.92 10*6/uL (ref 3.70–5.45)
RDW: 12.6 % (ref 11.2–14.5)
WBC: 4.8 10*3/uL (ref 3.9–10.3)
lymph#: 1.4 10*3/uL (ref 0.9–3.3)

## 2015-07-31 MED ORDER — DIPHENHYDRAMINE HCL 25 MG PO CAPS
25.0000 mg | ORAL_CAPSULE | Freq: Once | ORAL | Status: AC
  Administered 2015-07-31: 25 mg via ORAL

## 2015-07-31 MED ORDER — SODIUM CHLORIDE 0.9 % IV SOLN
Freq: Once | INTRAVENOUS | Status: AC
  Administered 2015-07-31: 11:00:00 via INTRAVENOUS

## 2015-07-31 MED ORDER — TRASTUZUMAB CHEMO INJECTION 440 MG
6.0000 mg/kg | Freq: Once | INTRAVENOUS | Status: AC
  Administered 2015-07-31: 315 mg via INTRAVENOUS
  Filled 2015-07-31: qty 15

## 2015-07-31 MED ORDER — ACETAMINOPHEN 325 MG PO TABS
ORAL_TABLET | ORAL | Status: AC
  Filled 2015-07-31: qty 2

## 2015-07-31 MED ORDER — SODIUM CHLORIDE 0.9 % IJ SOLN
10.0000 mL | INTRAMUSCULAR | Status: DC | PRN
  Administered 2015-07-31: 10 mL
  Filled 2015-07-31: qty 10

## 2015-07-31 MED ORDER — ACETAMINOPHEN 325 MG PO TABS
650.0000 mg | ORAL_TABLET | Freq: Once | ORAL | Status: AC
Start: 2015-07-31 — End: 2015-07-31
  Administered 2015-07-31: 650 mg via ORAL

## 2015-07-31 MED ORDER — DIPHENHYDRAMINE HCL 25 MG PO CAPS
ORAL_CAPSULE | ORAL | Status: AC
  Filled 2015-07-31: qty 1

## 2015-07-31 MED ORDER — HEPARIN SOD (PORK) LOCK FLUSH 100 UNIT/ML IV SOLN
500.0000 [IU] | Freq: Once | INTRAVENOUS | Status: AC | PRN
  Administered 2015-07-31: 500 [IU]
  Filled 2015-07-31: qty 5

## 2015-07-31 NOTE — Patient Instructions (Signed)
[facility] Discharge Instructions for Patients Receiving Chemotherapy  Today you received the following chemotherapy agents Herceptin  To help prevent nausea and vomiting after your treatment, we encourage you to take your nausea medication    If you develop nausea and vomiting that is not controlled by your nausea medication, call the clinic.   BELOW ARE SYMPTOMS THAT SHOULD BE REPORTED IMMEDIATELY:  *FEVER GREATER THAN 100.5 F  *CHILLS WITH OR WITHOUT FEVER  NAUSEA AND VOMITING THAT IS NOT CONTROLLED WITH YOUR NAUSEA MEDICATION  *UNUSUAL SHORTNESS OF BREATH  *UNUSUAL BRUISING OR BLEEDING  TENDERNESS IN MOUTH AND THROAT WITH OR WITHOUT PRESENCE OF ULCERS  *URINARY PROBLEMS  *BOWEL PROBLEMS  UNUSUAL RASH Items with * indicate a potential emergency and should be followed up as soon as possible.  Feel free to call the clinic you have any questions or concerns. The clinic phone number is [phone].  Please show the CHEMO ALERT CARD at check-in to the Emergency Department and triage nurse.   

## 2015-08-11 ENCOUNTER — Other Ambulatory Visit: Payer: Self-pay | Admitting: General Surgery

## 2015-08-13 ENCOUNTER — Ambulatory Visit (HOSPITAL_COMMUNITY)
Admission: RE | Admit: 2015-08-13 | Discharge: 2015-08-13 | Disposition: A | Discharge status: Home / Self Care | Payer: BC Managed Care – PPO | Source: Ambulatory Visit | Attending: Oncology | Admitting: Oncology

## 2015-08-13 DIAGNOSIS — Z5111 Encounter for antineoplastic chemotherapy: Secondary | ICD-10-CM | POA: Diagnosis not present

## 2015-08-13 DIAGNOSIS — C50211 Malignant neoplasm of upper-inner quadrant of right female breast: Secondary | ICD-10-CM | POA: Diagnosis present

## 2015-08-13 DIAGNOSIS — E785 Hyperlipidemia, unspecified: Secondary | ICD-10-CM | POA: Insufficient documentation

## 2015-08-13 DIAGNOSIS — I252 Old myocardial infarction: Secondary | ICD-10-CM | POA: Diagnosis not present

## 2015-08-13 MED ORDER — PERFLUTREN LIPID MICROSPHERE
INTRAVENOUS | Status: AC
  Filled 2015-08-13: qty 10

## 2015-08-13 NOTE — Progress Notes (Signed)
  Echocardiogram 2D Echocardiogram has been performed.  Beth Rice 08/13/2015, 10:56 AM

## 2015-08-14 ENCOUNTER — Encounter: Payer: Self-pay | Admitting: Nurse Practitioner

## 2015-08-21 ENCOUNTER — Ambulatory Visit (HOSPITAL_BASED_OUTPATIENT_CLINIC_OR_DEPARTMENT_OTHER): Payer: BC Managed Care – PPO

## 2015-08-21 ENCOUNTER — Other Ambulatory Visit (HOSPITAL_BASED_OUTPATIENT_CLINIC_OR_DEPARTMENT_OTHER): Payer: BC Managed Care – PPO

## 2015-08-21 VITALS — BP 103/66 | HR 72 | Temp 98.9°F | Resp 16

## 2015-08-21 DIAGNOSIS — C50411 Malignant neoplasm of upper-outer quadrant of right female breast: Secondary | ICD-10-CM

## 2015-08-21 DIAGNOSIS — Z5112 Encounter for antineoplastic immunotherapy: Secondary | ICD-10-CM | POA: Diagnosis not present

## 2015-08-21 DIAGNOSIS — C50211 Malignant neoplasm of upper-inner quadrant of right female breast: Secondary | ICD-10-CM

## 2015-08-21 LAB — CBC WITH DIFFERENTIAL/PLATELET
BASO%: 0.9 % (ref 0.0–2.0)
BASOS ABS: 0 10*3/uL (ref 0.0–0.1)
EOS ABS: 0.1 10*3/uL (ref 0.0–0.5)
EOS%: 2 % (ref 0.0–7.0)
HCT: 39.1 % (ref 34.8–46.6)
HEMOGLOBIN: 13.1 g/dL (ref 11.6–15.9)
LYMPH%: 22.4 % (ref 14.0–49.7)
MCH: 33.5 pg (ref 25.1–34.0)
MCHC: 33.6 g/dL (ref 31.5–36.0)
MCV: 99.6 fL (ref 79.5–101.0)
MONO#: 0.4 10*3/uL (ref 0.1–0.9)
MONO%: 9.6 % (ref 0.0–14.0)
NEUT%: 65.1 % (ref 38.4–76.8)
NEUTROS ABS: 2.8 10*3/uL (ref 1.5–6.5)
PLATELETS: 234 10*3/uL (ref 145–400)
RBC: 3.92 10*6/uL (ref 3.70–5.45)
RDW: 12.9 % (ref 11.2–14.5)
WBC: 4.3 10*3/uL (ref 3.9–10.3)
lymph#: 1 10*3/uL (ref 0.9–3.3)

## 2015-08-21 LAB — COMPREHENSIVE METABOLIC PANEL
ALT: 23 U/L (ref 0–55)
AST: 28 U/L (ref 5–34)
Albumin: 3.7 g/dL (ref 3.5–5.0)
Alkaline Phosphatase: 81 U/L (ref 40–150)
Anion Gap: 8 mEq/L (ref 3–11)
BUN: 13.2 mg/dL (ref 7.0–26.0)
CHLORIDE: 102 meq/L (ref 98–109)
CO2: 29 mEq/L (ref 22–29)
Calcium: 9.2 mg/dL (ref 8.4–10.4)
Creatinine: 0.8 mg/dL (ref 0.6–1.1)
EGFR: 78 mL/min/{1.73_m2} — AB (ref >90)
GLUCOSE: 104 mg/dL (ref 70–140)
POTASSIUM: 4.1 meq/L (ref 3.5–5.1)
SODIUM: 139 meq/L (ref 136–145)
Total Bilirubin: 0.46 mg/dL (ref 0.20–1.20)
Total Protein: 7.2 g/dL (ref 6.4–8.3)

## 2015-08-21 MED ORDER — ACETAMINOPHEN 325 MG PO TABS
ORAL_TABLET | ORAL | Status: AC
  Filled 2015-08-21: qty 2

## 2015-08-21 MED ORDER — ACETAMINOPHEN 325 MG PO TABS
650.0000 mg | ORAL_TABLET | Freq: Once | ORAL | Status: AC
  Administered 2015-08-21: 650 mg via ORAL

## 2015-08-21 MED ORDER — TRASTUZUMAB CHEMO INJECTION 440 MG
6.0000 mg/kg | Freq: Once | INTRAVENOUS | Status: AC
  Administered 2015-08-21: 315 mg via INTRAVENOUS
  Filled 2015-08-21: qty 15

## 2015-08-21 MED ORDER — DIPHENHYDRAMINE HCL 25 MG PO CAPS
25.0000 mg | ORAL_CAPSULE | Freq: Once | ORAL | Status: AC
  Administered 2015-08-21: 25 mg via ORAL

## 2015-08-21 MED ORDER — SODIUM CHLORIDE 0.9 % IJ SOLN
10.0000 mL | INTRAMUSCULAR | Status: DC | PRN
  Administered 2015-08-21: 10 mL
  Filled 2015-08-21: qty 10

## 2015-08-21 MED ORDER — SODIUM CHLORIDE 0.9 % IV SOLN
Freq: Once | INTRAVENOUS | Status: AC
  Administered 2015-08-21: 11:00:00 via INTRAVENOUS

## 2015-08-21 MED ORDER — DIPHENHYDRAMINE HCL 25 MG PO CAPS
ORAL_CAPSULE | ORAL | Status: AC
  Filled 2015-08-21: qty 1

## 2015-08-21 MED ORDER — HEPARIN SOD (PORK) LOCK FLUSH 100 UNIT/ML IV SOLN
500.0000 [IU] | Freq: Once | INTRAVENOUS | Status: AC | PRN
  Administered 2015-08-21: 500 [IU]
  Filled 2015-08-21: qty 5

## 2015-08-21 NOTE — Patient Instructions (Signed)
[facility] Discharge Instructions for Patients Receiving Chemotherapy  Today you received the following chemotherapy agents herceptin   To help prevent nausea and vomiting after your treatment, we encourage you to take your nausea medication as directed   If you develop nausea and vomiting that is not controlled by your nausea medication, call the clinic.   BELOW ARE SYMPTOMS THAT SHOULD BE REPORTED IMMEDIATELY:  *FEVER GREATER THAN 100.5 F  *CHILLS WITH OR WITHOUT FEVER  NAUSEA AND VOMITING THAT IS NOT CONTROLLED WITH YOUR NAUSEA MEDICATION  *UNUSUAL SHORTNESS OF BREATH  *UNUSUAL BRUISING OR BLEEDING  TENDERNESS IN MOUTH AND THROAT WITH OR WITHOUT PRESENCE OF ULCERS  *URINARY PROBLEMS  *BOWEL PROBLEMS  UNUSUAL RASH Items with * indicate a potential emergency and should be followed up as soon as possible.  Feel free to call the clinic you have any questions or concerns. The clinic phone number is [phone].  

## 2015-08-22 ENCOUNTER — Ambulatory Visit (INDEPENDENT_AMBULATORY_CARE_PROVIDER_SITE_OTHER): Payer: BC Managed Care – PPO | Admitting: Gynecology

## 2015-08-22 ENCOUNTER — Encounter: Payer: Self-pay | Admitting: Gynecology

## 2015-08-22 VITALS — BP 120/76 | Ht 62.0 in | Wt 123.0 lb

## 2015-08-22 DIAGNOSIS — C50911 Malignant neoplasm of unspecified site of right female breast: Secondary | ICD-10-CM | POA: Diagnosis not present

## 2015-08-22 DIAGNOSIS — Z01419 Encounter for gynecological examination (general) (routine) without abnormal findings: Secondary | ICD-10-CM

## 2015-08-22 DIAGNOSIS — N952 Postmenopausal atrophic vaginitis: Secondary | ICD-10-CM | POA: Diagnosis not present

## 2015-08-22 NOTE — Progress Notes (Signed)
    MICHELE LUCKING 08/25/1955 [id number]        60 y.o.  G0P0  for annual exam.  Doing well. Several issues noted below.  Past medical history,surgical history, problem list, medications, allergies, family history and social history were all reviewed and documented as reviewed in the EPIC chart.  ROS:  Performed with pertinent positives and negatives included in the history, assessment and plan.   Additional significant findings :  none   Exam: Caryn Bee assistant Filed Vitals:   08/22/15 1017  BP: 120/76  Height: 5\' 2"  (1.575 m)  Weight: 123 lb (55.792 kg)   General appearance:  Normal affect, orientation and appearance. Skin: Grossly normal HEENT: Without gross lesions.  No cervical or supraclavicular adenopathy. Thyroid normal.  Lungs:  Clear without wheezing, rales or rhonchi Cardiac: RR, without RMG Abdominal:  Soft, nontender, without masses, guarding, rebound, organomegaly or hernia Breasts:  Examined lying and sitting. Left without masses, retractions, discharge or axillary adenopathy.  Right with generalized radiation changes. Well-healed lumpectomy scar. No masses, discharge or adenopathy Pelvic:  Ext/BUS/vagina with atrophic changes  Cervix with atrophic changes  Uterus anteverted, normal size, shape and contour, midline and mobile nontender   Adnexa without masses or tenderness    Anus and perineum normal   Rectovaginal normal sphincter tone without palpated masses or tenderness.    Assessment/Plan:  60 y.o. G0P0 female for annual exam.   1. Postmenopausal/atrophic genital changes. Patient is having some discomfort with intercourse. Also some vaginal dryness. Discussed vaginal moisturizers as well as lubricants. Patient will go ahead and try these in follow up it continues to be an issue. Otherwise doing well without significant hot flushes or any vaginal bleeding.  Patient knows to report any vaginal bleeding. 2. Right breast cancer. Finishing her chemotherapy  now. Exam with no evidence of disease. We'll continue to follow with her oncologist as far as when to do her next mammogram. 3. DEXA 2010 normal. Recommend baseline now and patient will schedule. 4. Colonoscopy 2010. Repeat at their recommended interval. 5. Pap smear/HPV 07/2014 negative. No Pap smear done today. No history of abnormal Pap smears. Plan repeat at 5 year interval. 6. Health maintenance. No routine lab work done as patient reports this done at her primary physician's office. Follow up 1 year, sooner as needed.   Anastasio Auerbach MD, 10:43 AM 08/22/2015

## 2015-08-22 NOTE — Patient Instructions (Signed)

## 2015-09-06 NOTE — Pre-Procedure Instructions (Signed)
Beth Rice  09/06/2015        Your procedure is scheduled on April 18.  Report to [facility] Admitting at 5:30 A.M.  Call this number if you have problems the morning of surgery:  [phone]   Remember:  Do not eat food or drink liquids after midnight.  Take these medicines the morning of surgery with A SIP OF WATER Acyclovir, Alprazolam, Flonase, Gabapentin, Loratadine, Omeprazole, Zoloft,   STOP Aspirin, Calcium, Lysine, Multiple Vitamins April 11   STOP/ Do not take Aspirin, Aleve, Naproxen, Advil, Ibuprofen, Motrin, Vitamins, Herbs, or Supplements starting April 11   Do not wear jewelry, make-up or nail polish.  Do not wear lotions, powders, or perfumes.  You may wear deodorant.  Do not shave 48 hours prior to surgery.  Men may shave face and neck.  Do not bring valuables to the hospital.  [facility] is not responsible for any belongings or valuables.  Contacts, dentures or bridgework may not be worn into surgery.  Leave your suitcase in the car.  After surgery it may be brought to your room.  For patients admitted to the hospital, discharge time will be determined by your treatment team.  Patients discharged the day of surgery will not be allowed to drive home.   [place] - Preparing for Surgery  Before surgery, you can play an important role.  Because skin is not sterile, your skin needs to be as free of germs as possible.  You can reduce the number of germs on you skin by washing with CHG (chlorahexidine gluconate) soap before surgery.  CHG is an antiseptic cleaner which kills germs and bonds with the skin to continue killing germs even after washing.  Please DO NOT use if you have an allergy to CHG or antibacterial soaps.  If your skin becomes reddened/irritated stop using the CHG and inform your nurse when you arrive at Short Stay.  Do not shave (including legs and underarms) for at least 48 hours prior to the first CHG shower.  You may shave  your face.  Please follow these instructions carefully:   1.  Shower with CHG Soap the night before surgery and the morning of Surgery.  2.  If you choose to wash your hair, wash your hair first as usual with your normal shampoo.  3.  After you shampoo, rinse your hair and body thoroughly to remove the shampoo.  4.  Use CHG as you would any other liquid soap.  You can apply CHG directly to the skin and wash gently with scrungie or a clean washcloth.  5.  Apply the CHG Soap to your body ONLY FROM THE NECK DOWN.  Do not use on open wounds or open sores.  Avoid contact with your eyes, ears, mouth and genitals (private parts).  Wash genitals (private parts) with your normal soap.  6.  Wash thoroughly, paying special attention to the area where your surgery will be performed.  7.  Thoroughly rinse your body with warm water from the neck down.  8.  DO NOT shower/wash with your normal soap after using and rinsing off the CHG Soap.  9.  Pat yourself dry with a clean towel.            10.  Wear clean pajamas.            11.  Place clean sheets on your bed the night of your first shower and do not sleep with  pets.  Day of Surgery  Do not apply any lotions the morning of surgery.  Please wear clean clothes to the hospital/surgery center.  Please read over the following fact sheets that you were given. Pain Booklet, Coughing and Deep Breathing and Surgical Site Infection Prevention

## 2015-09-08 ENCOUNTER — Encounter (HOSPITAL_COMMUNITY): Payer: Self-pay

## 2015-09-08 ENCOUNTER — Encounter (HOSPITAL_COMMUNITY)
Admission: RE | Admit: 2015-09-08 | Discharge: 2015-09-08 | Disposition: A | Discharge status: Home / Self Care | Payer: BC Managed Care – PPO | Source: Ambulatory Visit | Attending: General Surgery | Admitting: General Surgery

## 2015-09-08 DIAGNOSIS — I10 Essential (primary) hypertension: Secondary | ICD-10-CM | POA: Insufficient documentation

## 2015-09-08 DIAGNOSIS — C50911 Malignant neoplasm of unspecified site of right female breast: Secondary | ICD-10-CM | POA: Insufficient documentation

## 2015-09-08 DIAGNOSIS — Z01818 Encounter for other preprocedural examination: Secondary | ICD-10-CM | POA: Diagnosis present

## 2015-09-08 DIAGNOSIS — Z01812 Encounter for preprocedural laboratory examination: Secondary | ICD-10-CM | POA: Insufficient documentation

## 2015-09-08 LAB — BASIC METABOLIC PANEL
ANION GAP: 11 (ref 5–15)
BUN: 8 mg/dL (ref 6–20)
CO2: 27 mmol/L (ref 22–32)
Calcium: 9.6 mg/dL (ref 8.9–10.3)
Chloride: 102 mmol/L (ref 101–111)
Creatinine, Ser: 0.77 mg/dL (ref 0.44–1.00)
GFR calc Af Amer: >60 mL/min (ref >60)
Glucose, Bld: 107 mg/dL — ABNORMAL HIGH (ref 65–99)
POTASSIUM: 4.3 mmol/L (ref 3.5–5.1)
SODIUM: 140 mmol/L (ref 135–145)

## 2015-09-08 LAB — CBC
HCT: 38.6 % (ref 36.0–46.0)
Hemoglobin: 13.1 g/dL (ref 12.0–15.0)
MCH: 33.2 pg (ref 26.0–34.0)
MCHC: 33.9 g/dL (ref 30.0–36.0)
MCV: 98 fL (ref 78.0–100.0)
PLATELETS: 215 10*3/uL (ref 150–400)
RBC: 3.94 MIL/uL (ref 3.87–5.11)
RDW: 12.2 % (ref 11.5–15.5)
WBC: 5.1 10*3/uL (ref 4.0–10.5)

## 2015-09-15 ENCOUNTER — Ambulatory Visit (HOSPITAL_BASED_OUTPATIENT_CLINIC_OR_DEPARTMENT_OTHER): Payer: BC Managed Care – PPO

## 2015-09-15 ENCOUNTER — Ambulatory Visit (HOSPITAL_BASED_OUTPATIENT_CLINIC_OR_DEPARTMENT_OTHER): Payer: BC Managed Care – PPO | Admitting: Oncology

## 2015-09-15 ENCOUNTER — Telehealth: Payer: Self-pay | Admitting: Oncology

## 2015-09-15 ENCOUNTER — Encounter: Payer: Self-pay | Admitting: *Deleted

## 2015-09-15 ENCOUNTER — Other Ambulatory Visit (HOSPITAL_BASED_OUTPATIENT_CLINIC_OR_DEPARTMENT_OTHER): Payer: BC Managed Care – PPO

## 2015-09-15 VITALS — BP 109/62 | HR 77 | Temp 98.2°F | Resp 18 | Ht 62.5 in | Wt 123.7 lb

## 2015-09-15 DIAGNOSIS — C50411 Malignant neoplasm of upper-outer quadrant of right female breast: Secondary | ICD-10-CM | POA: Diagnosis not present

## 2015-09-15 DIAGNOSIS — Z5112 Encounter for antineoplastic immunotherapy: Secondary | ICD-10-CM

## 2015-09-15 DIAGNOSIS — Z170 Estrogen receptor positive status [ER+]: Secondary | ICD-10-CM | POA: Diagnosis not present

## 2015-09-15 DIAGNOSIS — C50211 Malignant neoplasm of upper-inner quadrant of right female breast: Secondary | ICD-10-CM

## 2015-09-15 LAB — COMPREHENSIVE METABOLIC PANEL
ALBUMIN: 3.6 g/dL (ref 3.5–5.0)
ALT: 20 U/L (ref 0–55)
AST: 29 U/L (ref 5–34)
Alkaline Phosphatase: 73 U/L (ref 40–150)
Anion Gap: 8 mEq/L (ref 3–11)
BUN: 10.2 mg/dL (ref 7.0–26.0)
CHLORIDE: 102 meq/L (ref 98–109)
CO2: 28 mEq/L (ref 22–29)
CREATININE: 0.8 mg/dL (ref 0.6–1.1)
Calcium: 8.9 mg/dL (ref 8.4–10.4)
EGFR: 79 mL/min/{1.73_m2} — ABNORMAL LOW (ref >90)
GLUCOSE: 121 mg/dL (ref 70–140)
Potassium: 4.2 mEq/L (ref 3.5–5.1)
SODIUM: 138 meq/L (ref 136–145)
Total Bilirubin: 0.31 mg/dL (ref 0.20–1.20)
Total Protein: 6.7 g/dL (ref 6.4–8.3)

## 2015-09-15 LAB — CBC WITH DIFFERENTIAL/PLATELET
BASO%: 0.7 % (ref 0.0–2.0)
BASOS ABS: 0 10*3/uL (ref 0.0–0.1)
EOS%: 1.3 % (ref 0.0–7.0)
Eosinophils Absolute: 0.1 10*3/uL (ref 0.0–0.5)
HCT: 35.3 % (ref 34.8–46.6)
HEMOGLOBIN: 12.2 g/dL (ref 11.6–15.9)
LYMPH%: 29.5 % (ref 14.0–49.7)
MCH: 34.1 pg — AB (ref 25.1–34.0)
MCHC: 34.6 g/dL (ref 31.5–36.0)
MCV: 98.6 fL (ref 79.5–101.0)
MONO#: 0.3 10*3/uL (ref 0.1–0.9)
MONO%: 7.3 % (ref 0.0–14.0)
NEUT#: 2.8 10*3/uL (ref 1.5–6.5)
NEUT%: 61.2 % (ref 38.4–76.8)
Platelets: 170 10*3/uL (ref 145–400)
RBC: 3.58 10*6/uL — ABNORMAL LOW (ref 3.70–5.45)
RDW: 12.4 % (ref 11.2–14.5)
WBC: 4.6 10*3/uL (ref 3.9–10.3)
lymph#: 1.3 10*3/uL (ref 0.9–3.3)

## 2015-09-15 MED ORDER — HEPARIN SOD (PORK) LOCK FLUSH 100 UNIT/ML IV SOLN
500.0000 [IU] | Freq: Once | INTRAVENOUS | Status: AC | PRN
  Administered 2015-09-15: 500 [IU]
  Filled 2015-09-15: qty 5

## 2015-09-15 MED ORDER — DIPHENHYDRAMINE HCL 25 MG PO CAPS
ORAL_CAPSULE | ORAL | Status: AC
  Filled 2015-09-15: qty 1

## 2015-09-15 MED ORDER — CIPROFLOXACIN IN D5W 400 MG/200ML IV SOLN
400.0000 mg | INTRAVENOUS | Status: AC
  Administered 2015-09-16: 400 mg via INTRAVENOUS
  Filled 2015-09-15: qty 200

## 2015-09-15 MED ORDER — SODIUM CHLORIDE 0.9 % IV SOLN
Freq: Once | INTRAVENOUS | Status: AC
  Administered 2015-09-15: 13:00:00 via INTRAVENOUS

## 2015-09-15 MED ORDER — SODIUM CHLORIDE 0.9 % IJ SOLN
10.0000 mL | INTRAMUSCULAR | Status: DC | PRN
  Administered 2015-09-15: 10 mL
  Filled 2015-09-15: qty 10

## 2015-09-15 MED ORDER — ACETAMINOPHEN 325 MG PO TABS
ORAL_TABLET | ORAL | Status: AC
  Filled 2015-09-15: qty 2

## 2015-09-15 MED ORDER — TRASTUZUMAB CHEMO INJECTION 440 MG
6.0000 mg/kg | Freq: Once | INTRAVENOUS | Status: AC
  Administered 2015-09-15: 315 mg via INTRAVENOUS
  Filled 2015-09-15: qty 15

## 2015-09-15 MED ORDER — DIPHENHYDRAMINE HCL 25 MG PO CAPS
25.0000 mg | ORAL_CAPSULE | Freq: Once | ORAL | Status: AC
  Administered 2015-09-15: 25 mg via ORAL

## 2015-09-15 MED ORDER — ACETAMINOPHEN 325 MG PO TABS
650.0000 mg | ORAL_TABLET | Freq: Once | ORAL | Status: AC
  Administered 2015-09-15: 650 mg via ORAL

## 2015-09-15 NOTE — Patient Instructions (Signed)
[facility] Discharge Instructions for Patients Receiving Chemotherapy  Today you received the following chemotherapy agents herceptin   To help prevent nausea and vomiting after your treatment, we encourage you to take your nausea medication as directed   If you develop nausea and vomiting that is not controlled by your nausea medication, call the clinic.   BELOW ARE SYMPTOMS THAT SHOULD BE REPORTED IMMEDIATELY:  *FEVER GREATER THAN 100.5 F  *CHILLS WITH OR WITHOUT FEVER  NAUSEA AND VOMITING THAT IS NOT CONTROLLED WITH YOUR NAUSEA MEDICATION  *UNUSUAL SHORTNESS OF BREATH  *UNUSUAL BRUISING OR BLEEDING  TENDERNESS IN MOUTH AND THROAT WITH OR WITHOUT PRESENCE OF ULCERS  *URINARY PROBLEMS  *BOWEL PROBLEMS  UNUSUAL RASH Items with * indicate a potential emergency and should be followed up as soon as possible.  Feel free to call the clinic you have any questions or concerns. The clinic phone number is [phone].  

## 2015-09-15 NOTE — Telephone Encounter (Signed)
appt made and avs will print in treatment room. CT to be scheduled by central radiololgy

## 2015-09-15 NOTE — Progress Notes (Signed)
[place]  [phone] [phone]     ID: MYLEKA MONCURE DOB: Jun 06, 1955  MR#: [id number]  TXH#:[id number]  Patient Care Team: Darlyne Russian, MD as PCP - General (Family Medicine) [name], MD as Consulting Physician (General Surgery) Chauncey Cruel, MD as Consulting Physician (Oncology) Eppie Gibson, MD as Attending Physician (Radiation Oncology) Rockwell Germany, RN as Registered Nurse Mauro Kaufmann, RN as Registered Nurse PCP: [name], MD OTHER MD: Jamie Kato MD  CHIEF COMPLAINT: HER-2 positive breast cancer  CURRENT TREATMENT: completing anti-HER-2 immunotherapy 09/15/2015  BREAST CANCER HISTORY: From the original intake note:  Lamaya herself palpated a mass in her right breast but "didn't think much about it". She saw [name] for routine gynecologic follow-up and he also palpated and immediately set her up for right diagnostic mammography with tomosynthesis and ultrasonography at the breast Center 08/27/2014. In the upper outer quadrant of the right breast there was a spiculated mass accompanied by calcifications, the complex extending up to 2.5 cm. This was palpable and mobile. It measured approximately 2 cm by palpation. There was no palpable right axillary adenopathy. Ultrasound of the right axilla also showed normal axillary contents.  Ultrasound of the right breast confirmed an irregularly marginated hypoechoic mass measuring 1.8 cm. Biopsy was not immediately performed because of patient was on aspirin at the time. On 08/30/2014 [name] underwent biopsy of the mass in question, with the pathology ([id number]) (showing an invasive ductal carcinoma, grade 2, estrogen receptor 2% positive with moderate staining intensity, progesterone receptor negative, with an MIB-1 of 81% and with HER-2 amplified, the signals ratio being 2.87 and the number per cell 4.30.  On 09/03/2024 she underwent bilateral breast MRI. This found the  breast composition to be category C. In the right breast there was a spiculated mass measuring 2.3 cm in close proximity to the pectoralis but without obvious invasion of the muscle. The rest of the breast, the left breast, and the lymph node areas well otherwise unremarkable.  Her subsequent history is as detailed below   INTERVAL HISTORY: Zaria returns today for follow up of her HER-2 positive breast cancer accompanied by her friend Zigmund Daniel. Linder is receiving her last dose of trastuzumab today. She has tolerated this remarkably well. She has had no side effects that she is unclear. Her most recent echocardiogram shows a well-preserved ejection fraction.  REVIEW OF SYSTEMS: She exercises by walking a little and doing yard work. She is having some cough, which she attributes to seasonal allergies. She has occasional headaches. These are not more intense or persistent than usual. A detailed review of systems today was otherwise noncontributory  PAST MEDICAL HISTORY: Past Medical History  Diagnosis Date  . Anxiety   . Elevated cholesterol   . Depression   . Chest pain     a. 09/2008 Cath: ER 60%, LM nl, LAD 74m 432m, LCX nondom, nl, RCA dom, 2040mDA/PLA nl.  . Tobacco abuse   . H/O degenerative disc disease   . Headache     "frequency depends on the weather" (06/04/2014)  . Arthritis     "neck" (06/04/2014)  . Chronic lower back pain   . Gastroesophageal reflux disease     H/O  . Hot flashes   . [facility]    "I think I had a heart attack in 2010" cath showed small blockage- no treatment  . Cancer ([facility]2016    Rt breast  .  Anemia 12/2014    due to chemotherapy -received blood transfusion    PAST SURGICAL HISTORY: Past Surgical History  Procedure Laterality Date  . Ovarian cyst removal  1980's?  . Tonsillectomy    . Cardiac catheterization  10/15/2008    noncritical mid & distal third anterior descending disease  . Portacath placement Left 09/17/2014     Procedure: INSERTION PORT-A-CATH;  Surgeon: [name], MD;  Location: [place];  Service: General;  Laterality: Left;  . Breast lumpectomy with radioactive seed and sentinel lymph node biopsy Right 01/21/2015    Procedure: BREAST LUMPECTOMY WITH RADIOACTIVE SEED AND SENTINEL LYMPH NODE BIOPSY;  Surgeon: [name], MD;  Location: [place];  Service: General;  Laterality: Right;    FAMILY HISTORY Family History  Problem Relation Age of Onset  . Hypertension Sister   . Heart disease Mother   . Alzheimer's disease Mother   . Cerebral aneurysm Father   . Mental illness Father   . Ovarian cancer Maternal Aunt   The patient's father died at the age of 65 after having cerebral hemorrhages and age 79 and 69. The patient's mother died at the age of 26 with Parkinson's and Alzheimer's disease. The patient's mother had one sister and this sister was diagnosed with ovarian cancer in her early 53s. There is no other history of breast or ovarian cancer in the family.   GYNECOLOGIC HISTORY:  No LMP recorded. Patient is postmenopausal.  menarche age 60, the patient is GX P0. She stopped having periods in 2005. She did not take hormone replacement.   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 She is in process of retiring, with targeted retirement date in June. She is single, lives alone with 4 dogs.    ADVANCED DIRECTIVES: Not in place. She tells me she plans to name her sister [name] as her healthcare power of attorney. [name] (who is a Marine scientist) can be reached in [place] at [phone] (cell) or [phone] (home).   HEALTH MAINTENANCE: Social History  Substance Use Topics  . Smoking status: Former Smoker -- 1.00 packs/day for 20 years    Types: Cigarettes    Quit date: 06/21/2014  . Smokeless tobacco: Never Used  . Alcohol Use: 0.0 oz/week    0 Standard drinks or equivalent per  week     Comment: Rare     Colonoscopy: 2006/[facility]  OIB:[facility] 2016   B[facility]10/[facility]/normal   Lipid panel:  Allergies  Allergen Reactions  . Statins Other (See Comments)    Patient has significant muscle cramps with statins. She cannot tolerate them.  . Penicillins Hives  . Sulfa Antibiotics Hives    Current Outpatient Prescriptions  Medication Sig Dispense Refill  . acyclovir (ZOVIRAX) 400 MG tablet Take 1 tablet (400 mg total) by mouth 2 (two) times daily. 60 tablet 3  . ALPRAZolam (XANAX) 0.5 MG tablet Take 1 tablet every morning (Patient taking differently: Take 0.5 mg by mouth daily. ) 30 tablet 2  . aspirin EC 81 MG tablet Take 81 mg by mouth daily.    . Calcium Carbonate-Vitamin D (CALCIUM + D PO) Take 1 tablet by mouth at bedtime.     . clonazePAM (KLONOPIN) 1 MG tablet Take 1 tablet (1 mg total) by mouth at bedtime. 30 tablet 5  . fluticasone (FLONASE) 50 MCG/ACT nasal spray Place 2 sprays into both nostrils daily. 16 g  11  . gabapentin (NEURONTIN) 100 MG capsule Take 1 capsule (100 mg total) by mouth 3 (three) times daily. Take 1-3 capsules up to three times daily (Patient taking differently: Take 100 mg by mouth daily. ) 60 capsule 2  . loratadine (CLARITIN) 10 MG tablet Take 10 mg by mouth daily.    Marland Kitchen Lysine 500 MG CAPS Take 500 mg by mouth at bedtime.     . Multiple Vitamins-Minerals (MULTIVITAMIN WITH MINERALS) tablet Take 1 tablet by mouth at bedtime.     Marland Kitchen omeprazole (PRILOSEC) 40 MG capsule Take 1 capsule (40 mg total) by mouth daily. 30 capsule 2  . sertraline (ZOLOFT) 100 MG tablet Take 1 tablet (100 mg total) by mouth daily. 90 tablet 0   No current facility-administered medications for this visit.   Facility-Administered Medications Ordered in Other Visits  Medication Dose Route Frequency Provider Last Rate Last Dose  . [START ON 09/16/2015] ciprofloxacin (CIPRO) IVPB 400 mg  400 mg Intravenous To SS-Surg [name],  MD        OBJECTIVE: middle-aged white woman In no acute distress Filed Vitals:   09/15/15 1100  BP: 109/62  Pulse: 77  Temp: 98.2 F (36.8 C)  Resp: 18     Body mass index is 22.25 kg/(m^2).    ECOG FS:0 - Asymptomatic  Sclerae unicteric, EOMs intact Oropharynx clear, dentition in good repair No cervical or supraclavicular adenopathy Lungs no rales or rhonchi Heart regular rate and rhythm Abd soft, nontender, positive bowel sounds MSK no focal spinal tenderness, no upper extremity lymphedema Neuro: nonfocal, well oriented, appropriate affect Breasts: The right breast is status post lumpectomy and radiation. The cosmetic result is good. There is no evidence of local recurrence. The right axilla is benign. The left breast is unremarkable.  LAB RESULTS:  CMP     Component Value Date/Time   NA 138 09/15/2015 1052   NA 140 09/08/2015 0956   K 4.2 09/15/2015 1052   K 4.3 09/08/2015 0956   CL 102 09/08/2015 0956   CO2 28 09/15/2015 1052   CO2 27 09/08/2015 0956   GLUCOSE 121 09/15/2015 1052   GLUCOSE 107* 09/08/2015 0956   BUN 10.2 09/15/2015 1052   BUN 8 09/08/2015 0956   CREATININE 0.8 09/15/2015 1052   CREATININE 0.77 09/08/2015 0956   CREATININE 0.73 06/18/2014 1133   CALCIUM 8.9 09/15/2015 1052   CALCIUM 9.6 09/08/2015 0956   PROT 6.7 09/15/2015 1052   PROT 6.8 10/23/2013 1030   ALBUMIN 3.6 09/15/2015 1052   ALBUMIN 4.5 10/23/2013 1030   AST 29 09/15/2015 1052   AST 25 10/23/2013 1030   ALT 20 09/15/2015 1052   ALT 16 10/23/2013 1030   ALKPHOS 73 09/15/2015 1052   ALKPHOS 68 10/23/2013 1030   BILITOT 0.31 09/15/2015 1052   BILITOT 0.7 10/23/2013 1030   GFRNONAA >60 09/08/2015 0956   GFRNONAA >89 10/23/2013 1030   GFRAA >60 09/08/2015 0956   GFRAA >89 10/23/2013 1030    INo results found for: SPEP, UPEP  Lab Results  Component Value Date   WBC 4.6 09/15/2015   NEUTROABS 2.8 09/15/2015   HGB 12.2 09/15/2015   HCT 35.3 09/15/2015   MCV 98.6 09/15/2015    PLT 170 09/15/2015      Chemistry      Component Value Date/Time   NA 138 09/15/2015 1052   NA 140 09/08/2015 0956   K 4.2 09/15/2015 1052   K 4.3 09/08/2015 0956   CL 102  09/08/2015 0956   CO2 28 09/15/2015 1052   CO2 27 09/08/2015 0956   BUN 10.2 09/15/2015 1052   BUN 8 09/08/2015 0956   CREATININE 0.8 09/15/2015 1052   CREATININE 0.77 09/08/2015 0956   CREATININE 0.73 06/18/2014 1133      Component Value Date/Time   CALCIUM 8.9 09/15/2015 1052   CALCIUM 9.6 09/08/2015 0956   ALKPHOS 73 09/15/2015 1052   ALKPHOS 68 10/23/2013 1030   AST 29 09/15/2015 1052   AST 25 10/23/2013 1030   ALT 20 09/15/2015 1052   ALT 16 10/23/2013 1030   BILITOT 0.31 09/15/2015 1052   BILITOT 0.7 10/23/2013 1030       No results found for: LABCA2  No components found for: LABCA125  No results for input(s): INR in the last 168 hours.  Urinalysis    Component Value Date/Time   COLORURINE YELLOW 12/23/2014 1335   APPEARANCEUR CLEAR 12/23/2014 1335   LABSPEC 1.007 12/23/2014 1335   PHURINE 6.0 12/23/2014 1335   GLUCOSEU NEGATIVE 12/23/2014 1335   HGBUR NEGATIVE 12/23/2014 1335   BILIRUBINUR NEGATIVE 12/23/2014 1335   BILIRUBINUR neg 06/18/2014 1103   KETONESUR NEGATIVE 12/23/2014 1335   PROTEINUR NEGATIVE 12/23/2014 1335   PROTEINUR neg 06/18/2014 1103   UROBILINOGEN 0.2 12/23/2014 1335   UROBILINOGEN 0.2 06/18/2014 1103   NITRITE NEGATIVE 12/23/2014 1335   NITRITE neg 06/18/2014 1103   LEUKOCYTESUR NEGATIVE 12/23/2014 1335    STUDIES:  No results found.   ASSESSMENT: 60 y.o. [place] woman status post right breast biopsy 08/30/2014 for a clinical T2 N0, stage IIA invasive ductal carcinoma, grade 2, estrogen receptor 2% "positive," progesterone receptor negative, HER-2 positive, with an MIB-1 of 81%.  (1) neoadjuvant chemotherapy consisting of carboplatin, docetaxel, trastuzumab and pertuzumab x 6  beginning 09/23/14, completed 01/06/2015   (a) docetaxel switched to  gemcitabine for cycles 5 and 6 because of acute bilateral lower extremity edema  (2) trastuzumab will be continued to complete 1 year (through April 2017)  (a) most recent echo 08/13/2015 shows an EF of 55% (stable)  (3) status post right lumpectomy and sentinel lymph node sampling 01/21/2015 showing a complete pathologic response  (4) adjuvant radiation 02/27/2015-04/09/2015:  Right Breast. 50 Gy in 25 fractions. Right Breast Boost. 10 Gy in 5 fractions  (5) considered anti-estrogens but opted against them (for either treatment or prophylaxis)  (6) genetics testing pending  PLAN: Malene completes her year of trastuzumab today. She has done terrific with it, with no significant side effects and was a well maintained ejection fraction.  She has already arranged to have her port removed tomorrow.  She is behind on her mammography and I am putting her in 4 mammograms within the next week.  In addition she is concerned about her smoking history. I think she will qualify for a screening CT scan of the chest and I have entered that order as well.  She is getting a bone density through Dr. Zelphia Cairo office and she will make sure we get a copy of that.  She tells me she has been tested for BRCA and does not carry the mutation. However I am having trouble finding that report. I will check with our genetics counselors to close that issue.  Otherwise Jerra and K are thinking of moving, possibly to the [place], possibly elsewhere. They have not decided. I will be glad to facilitate follow-up of wherever they do end up from a medical oncology point of view.  She is going to see  me again in June. At that time we will start routine long-term follow-up.  Demya knows to call for any problems that may develop before her next visit here.    Chauncey Cruel, MD   09/15/2015 11:29 AM

## 2015-09-16 ENCOUNTER — Ambulatory Visit (HOSPITAL_COMMUNITY): Payer: BC Managed Care – PPO | Admitting: Certified Registered Nurse Anesthetist

## 2015-09-16 ENCOUNTER — Encounter (HOSPITAL_COMMUNITY): Payer: Self-pay | Admitting: *Deleted

## 2015-09-16 ENCOUNTER — Encounter (HOSPITAL_COMMUNITY)
Admission: RE | Disposition: A | Discharge status: Home / Self Care | Payer: Self-pay | Source: Ambulatory Visit | Attending: General Surgery

## 2015-09-16 ENCOUNTER — Ambulatory Visit (HOSPITAL_COMMUNITY)
Admission: RE | Admit: 2015-09-16 | Discharge: 2015-09-16 | Disposition: A | Discharge status: Home / Self Care | Payer: BC Managed Care – PPO | Source: Ambulatory Visit | Attending: General Surgery | Admitting: General Surgery

## 2015-09-16 DIAGNOSIS — I252 Old myocardial infarction: Secondary | ICD-10-CM | POA: Insufficient documentation

## 2015-09-16 DIAGNOSIS — Z87891 Personal history of nicotine dependence: Secondary | ICD-10-CM | POA: Diagnosis not present

## 2015-09-16 DIAGNOSIS — Z452 Encounter for adjustment and management of vascular access device: Secondary | ICD-10-CM | POA: Insufficient documentation

## 2015-09-16 DIAGNOSIS — F329 Major depressive disorder, single episode, unspecified: Secondary | ICD-10-CM | POA: Insufficient documentation

## 2015-09-16 DIAGNOSIS — F419 Anxiety disorder, unspecified: Secondary | ICD-10-CM | POA: Diagnosis not present

## 2015-09-16 DIAGNOSIS — Z79899 Other long term (current) drug therapy: Secondary | ICD-10-CM | POA: Insufficient documentation

## 2015-09-16 DIAGNOSIS — Z853 Personal history of malignant neoplasm of breast: Secondary | ICD-10-CM | POA: Diagnosis not present

## 2015-09-16 DIAGNOSIS — Z7982 Long term (current) use of aspirin: Secondary | ICD-10-CM | POA: Insufficient documentation

## 2015-09-16 DIAGNOSIS — Z7951 Long term (current) use of inhaled steroids: Secondary | ICD-10-CM | POA: Diagnosis not present

## 2015-09-16 DIAGNOSIS — E7800 Pure hypercholesterolemia, unspecified: Secondary | ICD-10-CM | POA: Insufficient documentation

## 2015-09-16 DIAGNOSIS — K219 Gastro-esophageal reflux disease without esophagitis: Secondary | ICD-10-CM | POA: Diagnosis not present

## 2015-09-16 HISTORY — PX: PORT-A-CATH REMOVAL: SHX5289

## 2015-09-16 SURGERY — REMOVAL PORT-A-CATH
Anesthesia: Monitor Anesthesia Care | Site: Chest

## 2015-09-16 MED ORDER — SODIUM CHLORIDE 0.9 % IV SOLN: 250.0000 mL | INTRAVENOUS | Status: DC | PRN

## 2015-09-16 MED ORDER — ACETAMINOPHEN 650 MG RE SUPP: 650.0000 mg | RECTAL | Status: DC | PRN

## 2015-09-16 MED ORDER — SODIUM CHLORIDE 0.9% FLUSH: 3.0000 mL | INTRAVENOUS | Status: DC | PRN

## 2015-09-16 MED ORDER — PROPOFOL 1000 MG/100ML IV EMUL
INTRAVENOUS | Status: AC
  Filled 2015-09-16: qty 100

## 2015-09-16 MED ORDER — PROPOFOL 10 MG/ML IV BOLUS
INTRAVENOUS | Status: AC
  Filled 2015-09-16: qty 20

## 2015-09-16 MED ORDER — OXYCODONE-ACETAMINOPHEN 5-325 MG PO TABS: 1.0000 | ORAL_TABLET | ORAL | Status: DC | PRN

## 2015-09-16 MED ORDER — BUPIVACAINE-EPINEPHRINE (PF) 0.25% -1:200000 IJ SOLN
INTRAMUSCULAR | Status: AC
  Filled 2015-09-16: qty 30

## 2015-09-16 MED ORDER — FENTANYL CITRATE (PF) 100 MCG/2ML IJ SOLN: 25.0000 ug | INTRAMUSCULAR | Status: DC | PRN

## 2015-09-16 MED ORDER — OXYCODONE HCL 5 MG/5ML PO SOLN: 5.0000 mg | Freq: Once | ORAL | Status: DC | PRN

## 2015-09-16 MED ORDER — PROPOFOL 500 MG/50ML IV EMUL
INTRAVENOUS | Status: DC | PRN
  Administered 2015-09-16: 100 ug/kg/min via INTRAVENOUS

## 2015-09-16 MED ORDER — ONDANSETRON HCL 4 MG/2ML IJ SOLN
INTRAMUSCULAR | Status: AC
Start: 2015-09-16 — End: 2015-09-16
  Filled 2015-09-16: qty 2

## 2015-09-16 MED ORDER — FENTANYL CITRATE (PF) 100 MCG/2ML IJ SOLN
INTRAMUSCULAR | Status: DC | PRN
  Administered 2015-09-16 (×2): 25 ug via INTRAVENOUS

## 2015-09-16 MED ORDER — LACTATED RINGERS IV SOLN
INTRAVENOUS | Status: DC | PRN
  Administered 2015-09-16: 08:00:00 via INTRAVENOUS

## 2015-09-16 MED ORDER — OXYCODONE HCL 5 MG PO TABS: 5.0000 mg | ORAL_TABLET | ORAL | Status: DC | PRN

## 2015-09-16 MED ORDER — 0.9 % SODIUM CHLORIDE (POUR BTL) OPTIME
TOPICAL | Status: DC | PRN
  Administered 2015-09-16 (×2): 1000 mL

## 2015-09-16 MED ORDER — ACETAMINOPHEN 160 MG/5ML PO SOLN: 325.0000 mg | ORAL | Status: DC | PRN

## 2015-09-16 MED ORDER — MIDAZOLAM HCL 2 MG/2ML IJ SOLN
INTRAMUSCULAR | Status: AC
  Filled 2015-09-16: qty 2

## 2015-09-16 MED ORDER — MIDAZOLAM HCL 5 MG/5ML IJ SOLN
INTRAMUSCULAR | Status: DC | PRN
  Administered 2015-09-16 (×2): 1 mg via INTRAVENOUS

## 2015-09-16 MED ORDER — LIDOCAINE HCL (CARDIAC) 20 MG/ML IV SOLN
INTRAVENOUS | Status: AC
  Filled 2015-09-16: qty 5

## 2015-09-16 MED ORDER — LIDOCAINE HCL 1 % IJ SOLN
INTRAMUSCULAR | Status: DC | PRN
  Administered 2015-09-16 (×2): 40 mL

## 2015-09-16 MED ORDER — ACETAMINOPHEN 325 MG PO TABS: 650.0000 mg | ORAL_TABLET | ORAL | Status: DC | PRN

## 2015-09-16 MED ORDER — ACETAMINOPHEN 325 MG PO TABS: 325.0000 mg | ORAL_TABLET | ORAL | Status: DC | PRN

## 2015-09-16 MED ORDER — LIDOCAINE HCL (PF) 1 % IJ SOLN
INTRAMUSCULAR | Status: AC
  Filled 2015-09-16: qty 30

## 2015-09-16 MED ORDER — SODIUM CHLORIDE 0.9% FLUSH: 3.0000 mL | Freq: Two times a day (BID) | INTRAVENOUS | Status: DC

## 2015-09-16 MED ORDER — OXYCODONE HCL 5 MG PO TABS: 5.0000 mg | ORAL_TABLET | Freq: Once | ORAL | Status: DC | PRN

## 2015-09-16 MED ORDER — ONDANSETRON HCL 4 MG/2ML IJ SOLN
INTRAMUSCULAR | Status: DC | PRN
  Administered 2015-09-16: 4 mg via INTRAVENOUS

## 2015-09-16 SURGICAL SUPPLY — 30 items
BLADE SURG 15 STRL LF DISP TIS (BLADE) ×1 IMPLANT
BLADE SURG 15 STRL SS (BLADE) ×2
CHLORAPREP W/TINT 10.5 ML (MISCELLANEOUS) ×3 IMPLANT
COVER SURGICAL LIGHT HANDLE (MISCELLANEOUS) ×3 IMPLANT
DECANTER SPIKE VIAL GLASS SM (MISCELLANEOUS) ×6 IMPLANT
DRAPE LAPAROTOMY T 98X78 PEDS (DRAPES) ×3 IMPLANT
DRAPE UTILITY XL STRL (DRAPES) ×3 IMPLANT
ELECT CAUTERY BLADE 6.4 (BLADE) ×3 IMPLANT
ELECT REM PT RETURN 9FT ADLT (ELECTROSURGICAL) ×3
ELECTRODE REM PT RTRN 9FT ADLT (ELECTROSURGICAL) ×1 IMPLANT
GAUZE SPONGE 4X4 16PLY XRAY LF (GAUZE/BANDAGES/DRESSINGS) ×3 IMPLANT
GLOVE BIO SURGEON STRL SZ 6 (GLOVE) ×3 IMPLANT
GLOVE BIOGEL PI IND STRL 6.5 (GLOVE) ×1 IMPLANT
GLOVE BIOGEL PI INDICATOR 6.5 (GLOVE) ×2
GOWN STRL REUS W/ TWL LRG LVL3 (GOWN DISPOSABLE) ×1 IMPLANT
GOWN STRL REUS W/TWL 2XL LVL3 (GOWN DISPOSABLE) ×3 IMPLANT
GOWN STRL REUS W/TWL LRG LVL3 (GOWN DISPOSABLE) ×3
KIT BASIN OR (CUSTOM PROCEDURE TRAY) ×3 IMPLANT
KIT ROOM TURNOVER OR (KITS) ×3 IMPLANT
LIQUID BAND (GAUZE/BANDAGES/DRESSINGS) ×3 IMPLANT
NEEDLE HYPO 25GX1X1/2 BEV (NEEDLE) ×3 IMPLANT
NS IRRIG 1000ML POUR BTL (IV SOLUTION) ×3 IMPLANT
PACK SURGICAL SETUP 50X90 (CUSTOM PROCEDURE TRAY) ×3 IMPLANT
PAD ARMBOARD 7.5X6 YLW CONV (MISCELLANEOUS) ×6 IMPLANT
PENCIL BUTTON HOLSTER BLD 10FT (ELECTRODE) ×3 IMPLANT
SUT MON AB 4-0 PC3 18 (SUTURE) ×3 IMPLANT
SUT VIC AB 3-0 SH 27 (SUTURE) ×3
SUT VIC AB 3-0 SH 27X BRD (SUTURE) ×1 IMPLANT
SYR CONTROL 10ML LL (SYRINGE) ×3 IMPLANT
TOWEL OR 17X24 6PK STRL BLUE (TOWEL DISPOSABLE) ×3 IMPLANT

## 2015-09-16 NOTE — Discharge Instructions (Signed)
[place] Office Phone Number [phone]   POST OP INSTRUCTIONS  Always review your discharge instruction sheet given to you by the facility where your surgery was performed.  IF YOU HAVE DISABILITY OR FAMILY LEAVE FORMS, YOU MUST BRING THEM TO THE OFFICE FOR PROCESSING.  DO NOT GIVE THEM TO YOUR DOCTOR.  1. A prescription for pain medication may be given to you upon discharge.  Take your pain medication as prescribed, if needed.  If narcotic pain medicine is not needed, then you may take acetaminophen (Tylenol) or ibuprofen (Advil) as needed. 2. Take your usually prescribed medications unless otherwise directed 3. If you need a refill on your pain medication, please contact your pharmacy.  They will contact our office to request authorization.  Prescriptions will not be filled after 5pm or on week-ends. 4. You should eat very light the first 24 hours after surgery, such as soup, crackers, pudding, etc.  Resume your normal diet the day after surgery 5. It is common to experience some constipation if taking pain medication after surgery.  Increasing fluid intake and taking a stool softener will usually help or prevent this problem from occurring.  A mild laxative (Milk of Magnesia or Miralax) should be taken according to package directions if there are no bowel movements after 48 hours. 6. You may shower in 48 hours.  The surgical glue will flake off in 2-3 weeks.   7. ACTIVITIES:  No strenuous activity or heavy lifting for 1 week.   a. You may drive when you no longer are taking prescription pain medication, you can comfortably wear a seatbelt, and you can safely maneuver your car and apply brakes. b. RETURN TO WORK:  __________3-7 days if no lifting._______________ You should see your doctor in the office for a follow-up appointment approximately three-four weeks after your surgery.    WHEN TO CALL YOUR DOCTOR: 1. Fever over 101.0 2. Nausea and/or vomiting. 3. Extreme  swelling or bruising. 4. Continued bleeding from incision. 5. Increased pain, redness, or drainage from the incision.  The clinic staff is available to answer your questions during regular business hours.  Please dont hesitate to call and ask to speak to one of the nurses for clinical concerns.  If you have a medical emergency, go to the nearest emergency room or call 911.  A surgeon from [facility] is always on call at the hospital.  For further questions, please visit [email or web address]

## 2015-09-16 NOTE — H&P (Signed)
Beth Rice is an 60 y.o. female.   Chief Complaint: Right breast cancer HPI: Pt is a 60 yo F s/p tx for right breast cancer.  She completed course of adjuvant therapy.  She desires removal.   Past Medical History  Diagnosis Date  . Anxiety   . Elevated cholesterol   . Depression   . Chest pain     a. 09/2008 Cath: ER 60%, LM nl, LAD 39m 454m, LCX nondom, nl, RCA dom, 2034mDA/PLA nl.  . Tobacco abuse   . H/O degenerative disc disease   . Headache     "frequency depends on the weather" (06/04/2014)  . Arthritis     "neck" (06/04/2014)  . Chronic lower back pain   . Gastroesophageal reflux disease     H/O  . Hot flashes   . [facility]    "I think I had a heart attack in 2010" cath showed small blockage- no treatment  . [place]    Rt breast  . Anemia 12/2014    due to chemotherapy -received blood transfusion    Past Surgical History  Procedure Laterality Date  . Ovarian cyst removal  1980's?  . Tonsillectomy    . Cardiac catheterization  10/15/2008    noncritical mid & distal third anterior descending disease  . Portacath placement Left 09/17/2014    Procedure: INSERTION PORT-A-CATH;  Surgeon: FaeStark KleinD;  [facility];  Laterality: Left;  . Breast lumpectomy with radioactive seed and sentinel lymph node biopsy Right 01/21/2015    Procedure: BREAST LUMPECTOMY WITH RADIOACTIVE SEED AND SENTINEL LYMPH NODE BIOPSY;  Surgeon: FaeStark KleinD;  [facility] [place];  Laterality: Right;    Family History  Problem Relation Age of Onset  . Hypertension Sister   . Heart disease Mother   . Alzheimer's disease Mother   . Cerebral aneurysm Father   . Mental illness Father   . Ovarian cancer Maternal Aunt    Social History:  reports that she quit smoking about 14 months ago. Her smoking use included Cigarettes. She has a 20 pack-year smoking history. She has never used  smokeless tobacco. She reports that she drinks alcohol. She reports that she does not use illicit drugs.  Allergies:  Allergies  Allergen Reactions  . Statins Other (See Comments)    Patient has significant muscle cramps with statins. She cannot tolerate them.  . Penicillins Hives  . Sulfa Antibiotics Hives    Medications Prior to Admission  Medication Sig Dispense Refill  . ALPRAZolam (XANAX) 0.5 MG tablet Take 1 tablet every morning (Patient taking differently: Take 0.5 mg by mouth daily. ) 30 tablet 2  . aspirin EC 81 MG tablet Take 81 mg by mouth daily.    . Calcium Carbonate-Vitamin D (CALCIUM + D PO) Take 1 tablet by mouth at bedtime.     . clonazePAM (KLONOPIN) 1 MG tablet Take 1 tablet (1 mg total) by mouth at bedtime. 30 tablet 5  . fluticasone (FLONASE) 50 MCG/ACT nasal spray Place 2 sprays into both nostrils daily. 16 g 11  . gabapentin (NEURONTIN) 100 MG capsule Take 1 capsule (100 mg total) by mouth 3 (three) times daily. Take 1-3 capsules up to three times daily (Patient taking differently: Take 100 mg by mouth daily. ) 60 capsule 2  . loratadine (CLARITIN) 10 MG tablet Take 10 mg by mouth daily.    . LMarland Kitchensine  500 MG CAPS Take 500 mg by mouth at bedtime.     . Multiple Vitamins-Minerals (MULTIVITAMIN WITH MINERALS) tablet Take 1 tablet by mouth at bedtime.     Marland Kitchen omeprazole (PRILOSEC) 40 MG capsule Take 1 capsule (40 mg total) by mouth daily. 30 capsule 2  . sertraline (ZOLOFT) 100 MG tablet Take 1 tablet (100 mg total) by mouth daily. 90 tablet 0  . acyclovir (ZOVIRAX) 400 MG tablet Take 1 tablet (400 mg total) by mouth 2 (two) times daily. 60 tablet 3    Results for orders placed or performed in visit on 09/15/15 (from the past 48 hour(s))  CBC with Differential     Status: Abnormal   Collection Time: 09/15/15 10:52 AM  Result Value Ref Range   WBC 4.6 3.9 - 10.3 10e3/uL   NEUT# 2.8 1.5 - 6.5 10e3/uL   HGB 12.2 11.6 - 15.9 g/dL   HCT 35.3 34.8 - 46.6 %   Platelets 170  145 - 400 10e3/uL   MCV 98.6 79.5 - 101.0 fL   MCH 34.1 (H) 25.1 - 34.0 pg   MCHC 34.6 31.5 - 36.0 g/dL   RBC 3.58 (L) 3.70 - 5.45 10e6/uL   RDW 12.4 11.2 - 14.5 %   lymph# 1.3 0.9 - 3.3 10e3/uL   MONO# 0.3 0.1 - 0.9 10e3/uL   Eosinophils Absolute 0.1 0.0 - 0.5 10e3/uL   Basophils Absolute 0.0 0.0 - 0.1 10e3/uL   NEUT% 61.2 38.4 - 76.8 %   LYMPH% 29.5 14.0 - 49.7 %   MONO% 7.3 0.0 - 14.0 %   EOS% 1.3 0.0 - 7.0 %   BASO% 0.7 0.0 - 2.0 %  Comprehensive metabolic panel     Status: Abnormal   Collection Time: 09/15/15 10:52 AM  Result Value Ref Range   Sodium 138 136 - 145 mEq/L   Potassium 4.2 3.5 - 5.1 mEq/L   Chloride 102 98 - 109 mEq/L   CO2 28 22 - 29 mEq/L   Glucose 121 70 - 140 mg/dl    Comment: Glucose reference range is for nonfasting patients. Fasting glucose reference range is 70- 100.   BUN 10.2 7.0 - 26.0 mg/dL   Creatinine 0.8 0.6 - 1.1 mg/dL   Total Bilirubin 0.31 0.20 - 1.20 mg/dL   Alkaline Phosphatase 73 40 - 150 U/L   AST 29 5 - 34 U/L   ALT 20 0 - 55 U/L   Total Protein 6.7 6.4 - 8.3 g/dL   Albumin 3.6 3.5 - 5.0 g/dL   Calcium 8.9 8.4 - 10.4 mg/dL   Anion Gap 8 3 - 11 mEq/L   EGFR 79 (L) >90 ml/min/1.73 m2    Comment: eGFR is calculated using the CKD-EPI Creatinine Equation (2009)   No results found.  Review of Systems  All other systems reviewed and are negative.   Blood pressure 89/51, pulse 64, temperature 98 F (36.7 C), temperature source Oral, resp. rate 18, height 5' 2.5" (1.588 m), weight 55.792 kg (123 lb), SpO2 99 %. Physical Exam  Constitutional: She is oriented to person, place, and time. She appears well-developed and well-nourished. No distress.  HENT:  Head: Normocephalic and atraumatic.  Eyes: Pupils are equal, round, and reactive to light. No scleral icterus.  Neck: Neck supple.  Cardiovascular: Normal rate.   Respiratory: Effort normal. No respiratory distress.  GI: Soft.  Musculoskeletal: Normal range of motion.  Neurological:  She is alert and oriented to person, place, and time.  Skin:  Skin is warm and dry. She is not diaphoretic.  Psychiatric: She has a normal mood and affect. Her behavior is normal. Judgment and thought content normal.     Assessment/Plan Right breast cancer- completed therapy.  Plan removal of port a cath. Discussed risks and benefits.    [name], MD 09/16/2015, 7:27 AM

## 2015-09-16 NOTE — Op Note (Signed)
  PRE-OPERATIVE DIAGNOSIS:  un-needed Port-A-Cath for right breast cancer  POST-OPERATIVE DIAGNOSIS:  Same   PROCEDURE:  Procedure(s):  REMOVAL PORT-A-CATH  SURGEON:  Surgeon(s):  Orie Cuttino, MD  ANESTHESIA:   MAC + local  EBL:   Minimal  SPECIMEN:  None  Complications : none known  Procedure:   Pt was  identified in the holding area and taken to the operating room where she was placed supine on the operating room table.  MAC anesthesia was induced.  The left upper chest was prepped and draped.  The prior incision was anesthetized with local anesthetic.  The incision was opened with a #15 blade.  The subcutaneous tissue was divided with the cautery.  The port was identified and the capsule opened.  The four 2-0 prolene sutures were removed.  The port was then removed and pressure held on the tract.  The catheter appeared intact without evidence of breakage, length was 22 cm.  The wound was inspected for hemostasis, which was achieved with cautery.  The wound was closed with 3-0 vicryl deep dermal interrupted sutures and 4-0 Monocryl running subcuticular suture.  The wound was cleaned, dried, and dressed with dermabond.  The patient was awakened from anesthesia and taken to the PACU in stable condition.  Needle, sponge, and instrument counts are correct.     

## 2015-09-16 NOTE — Anesthesia Preprocedure Evaluation (Signed)
Anesthesia Evaluation  Patient identified by MRN, date of birth, ID band Patient awake    Reviewed: Allergy & Precautions, NPO status , Patient's Chart, lab work & pertinent test results  History of Anesthesia Complications Negative for: history of anesthetic complications  Airway Mallampati: I  TM Distance: >3 FB Neck ROM: Full    Dental  (+) Partial Lower   Pulmonary neg shortness of breath, neg sleep apnea, neg COPD, neg recent URI, former smoker,    breath sounds clear to auscultation       Cardiovascular (-) hypertension(-) Past MI and (-) CHF  Rhythm:Regular     Neuro/Psych  Headaches, PSYCHIATRIC DISORDERS Anxiety Depression  Neuromuscular disease    GI/Hepatic Neg liver ROS, GERD  Controlled,  Endo/Other  negative endocrine ROS  Renal/GU negative Renal ROS     Musculoskeletal  (+) Arthritis ,   Abdominal   Peds  Hematology negative hematology ROS (+)   Anesthesia Other Findings   Reproductive/Obstetrics                             Anesthesia Physical Anesthesia Plan  ASA: II  Anesthesia Plan: MAC   Post-op Pain Management:    Induction: Intravenous  Airway Management Planned: Natural Airway, Nasal Cannula and Simple Face Mask  Additional Equipment: None  Intra-op Plan:   Post-operative Plan: Extubation in OR  Informed Consent: I have reviewed the patients History and Physical, chart, labs and discussed the procedure including the risks, benefits and alternatives for the proposed anesthesia with the patient or authorized representative who has indicated his/her understanding and acceptance.   Dental advisory given  Plan Discussed with: CRNA and Surgeon  Anesthesia Plan Comments:         Anesthesia Quick Evaluation

## 2015-09-16 NOTE — Transfer of Care (Signed)
Immediate Anesthesia Transfer of Care Note  Patient: Beth Rice  Procedure(s) Performed: Procedure(s): REMOVAL PORT-A-CATH (N/A)  Patient Location: PACU  Anesthesia Type:MAC  Level of Consciousness: awake, oriented, patient cooperative and responds to stimulation  Airway & Oxygen Therapy: Patient Spontanous Breathing  Post-op Assessment: Report given to RN, Post -op Vital signs reviewed and stable and Patient moving all extremities X 4  Post vital signs: Reviewed and stable  Last Vitals:  Filed Vitals:   09/16/15 0618 09/16/15 0818  BP: 89/51 84/63  Pulse: 64 72  Temp: 36.7 C 36.7 C  Resp: 18 16    Complications: No apparent anesthesia complications

## 2015-09-17 ENCOUNTER — Encounter (HOSPITAL_COMMUNITY): Payer: Self-pay | Admitting: General Surgery

## 2015-09-17 NOTE — Anesthesia Postprocedure Evaluation (Signed)
Anesthesia Post Note  Patient: Beth Rice  Procedure(s) Performed: Procedure(s) (LRB): REMOVAL PORT-A-CATH (N/A)  Patient location during evaluation: PACU Anesthesia Type: MAC Level of consciousness: awake Pain management: pain level controlled Vital Signs Assessment: post-procedure vital signs reviewed and stable Respiratory status: spontaneous breathing Cardiovascular status: stable Postop Assessment: no signs of nausea or vomiting Anesthetic complications: no    Last Vitals:  Filed Vitals:   09/16/15 0840 09/16/15 0850  BP: 97/60 90/63  Pulse: 69 69  Temp: 36.4 C   Resp: 19     Last Pain:  Filed Vitals:   09/16/15 0856  PainSc: 0-No pain                 Lamona Eimer

## 2015-09-23 ENCOUNTER — Encounter: Payer: Self-pay | Admitting: Gynecology

## 2015-09-23 ENCOUNTER — Ambulatory Visit (HOSPITAL_COMMUNITY)
Admission: RE | Admit: 2015-09-23 | Discharge: 2015-09-23 | Disposition: A | Discharge status: Home / Self Care | Payer: BC Managed Care – PPO | Source: Ambulatory Visit | Attending: Oncology | Admitting: Oncology

## 2015-09-23 ENCOUNTER — Other Ambulatory Visit: Payer: Self-pay | Admitting: Gynecology

## 2015-09-23 ENCOUNTER — Telehealth: Payer: Self-pay

## 2015-09-23 ENCOUNTER — Ambulatory Visit (INDEPENDENT_AMBULATORY_CARE_PROVIDER_SITE_OTHER): Payer: BC Managed Care – PPO

## 2015-09-23 DIAGNOSIS — Z9221 Personal history of antineoplastic chemotherapy: Secondary | ICD-10-CM

## 2015-09-23 DIAGNOSIS — Z13820 Encounter for screening for osteoporosis: Secondary | ICD-10-CM

## 2015-09-23 DIAGNOSIS — C50211 Malignant neoplasm of upper-inner quadrant of right female breast: Secondary | ICD-10-CM

## 2015-09-23 DIAGNOSIS — Z01419 Encounter for gynecological examination (general) (routine) without abnormal findings: Secondary | ICD-10-CM

## 2015-09-23 NOTE — Telephone Encounter (Signed)
I returned call to [facility] in [place].  She reports that pt arrived to CT with a cough.  The protocol for this scan states pt can not be symptomatic.  After consult with their MD, they questioned and pt responded she had been "coughing for a few weeks productive yellow/green sputum".  When they advised pt they could not perform the scan, she told them her cough had just started all sputum was clear and that she had a follow up scheduled with Dr. Jana Hakim to go over scan results.  They did not perform the scan.  They will re-schedule at a later date.  They are notifying us in case we needed to move a FU visit.    Chart reviewed - pt follow up scheduled on 6/19.  Routed to MD.

## 2015-10-06 ENCOUNTER — Other Ambulatory Visit: Payer: Self-pay | Admitting: *Deleted

## 2015-10-06 ENCOUNTER — Ambulatory Visit
Admission: RE | Admit: 2015-10-06 | Discharge: 2015-10-06 | Disposition: A | Discharge status: Home / Self Care | Payer: BC Managed Care – PPO | Source: Ambulatory Visit | Attending: Oncology | Admitting: Oncology

## 2015-10-06 ENCOUNTER — Ambulatory Visit (HOSPITAL_COMMUNITY)
Admission: RE | Admit: 2015-10-06 | Discharge: 2015-10-06 | Disposition: A | Discharge status: Home / Self Care | Payer: BC Managed Care – PPO | Source: Ambulatory Visit | Attending: Oncology | Admitting: Oncology

## 2015-10-06 ENCOUNTER — Encounter (HOSPITAL_COMMUNITY): Payer: Self-pay

## 2015-10-06 DIAGNOSIS — F17200 Nicotine dependence, unspecified, uncomplicated: Secondary | ICD-10-CM | POA: Insufficient documentation

## 2015-10-06 DIAGNOSIS — K769 Liver disease, unspecified: Secondary | ICD-10-CM | POA: Diagnosis not present

## 2015-10-06 DIAGNOSIS — R059 Cough, unspecified: Secondary | ICD-10-CM

## 2015-10-06 DIAGNOSIS — J439 Emphysema, unspecified: Secondary | ICD-10-CM | POA: Insufficient documentation

## 2015-10-06 DIAGNOSIS — C50211 Malignant neoplasm of upper-inner quadrant of right female breast: Secondary | ICD-10-CM | POA: Diagnosis present

## 2015-10-06 DIAGNOSIS — R918 Other nonspecific abnormal finding of lung field: Secondary | ICD-10-CM | POA: Insufficient documentation

## 2015-10-06 DIAGNOSIS — I2510 Atherosclerotic heart disease of native coronary artery without angina pectoris: Secondary | ICD-10-CM | POA: Diagnosis not present

## 2015-10-06 DIAGNOSIS — R05 Cough: Secondary | ICD-10-CM | POA: Insufficient documentation

## 2015-10-06 DIAGNOSIS — R0789 Other chest pain: Secondary | ICD-10-CM | POA: Insufficient documentation

## 2015-10-06 DIAGNOSIS — Z9889 Other specified postprocedural states: Secondary | ICD-10-CM | POA: Diagnosis not present

## 2015-10-06 DIAGNOSIS — Z720 Tobacco use: Secondary | ICD-10-CM

## 2015-10-07 ENCOUNTER — Other Ambulatory Visit: Payer: Self-pay | Admitting: Oncology

## 2015-10-08 ENCOUNTER — Other Ambulatory Visit: Payer: Self-pay | Admitting: Oncology

## 2015-10-08 NOTE — Progress Notes (Unsigned)
I called Beth Rice to give her the results of her CT scan. She understands we don't see evidence of cancer or pneumonia but there are some findings that will need follow-up with repeat CT scan in about 3 months. We will obtain that scan with contrast.

## 2015-10-15 ENCOUNTER — Ambulatory Visit: Payer: BC Managed Care – PPO | Admitting: Family Medicine

## 2015-10-16 ENCOUNTER — Other Ambulatory Visit: Payer: Self-pay | Admitting: Emergency Medicine

## 2015-10-17 ENCOUNTER — Telehealth: Payer: Self-pay

## 2015-10-17 MED ORDER — CLONAZEPAM 1 MG PO TABS: 1.0000 mg | ORAL_TABLET | Freq: Every day | ORAL | Status: DC

## 2015-10-17 NOTE — Telephone Encounter (Signed)
Can someone help? Dr, Everlene Farrier pt.

## 2015-10-17 NOTE — Telephone Encounter (Signed)
Pt is checking on status of her klonopin refill she is leaving to go out of town today   Best number [phone]

## 2015-10-17 NOTE — Telephone Encounter (Signed)
Meds ordered this encounter  Medications  . clonazePAM (KLONOPIN) 1 MG tablet    Sig: Take 1 tablet (1 mg total) by mouth at bedtime.    Dispense:  30 tablet    Refill:  0    Not to exceed 5 additional fills before 09/07/2015    Order Specific Question:  Supervising Provider    Answer:  Tami Lin P [id number]

## 2015-10-18 NOTE — Telephone Encounter (Signed)
Please check on this. It is okay to fill her medications. She is currently going frequently to the cancer center for follow-up of her breast cancer. All medicines are okay to refill.

## 2015-10-20 ENCOUNTER — Other Ambulatory Visit: Payer: Self-pay | Admitting: Emergency Medicine

## 2015-10-20 ENCOUNTER — Other Ambulatory Visit: Payer: Self-pay

## 2015-10-20 DIAGNOSIS — G4700 Insomnia, unspecified: Secondary | ICD-10-CM

## 2015-10-20 NOTE — Telephone Encounter (Signed)
Patient would like a refill for xanax.  [place]

## 2015-10-20 NOTE — Telephone Encounter (Signed)
Faxed to local pharm. Pt aware and will have transferred to pharm where she is going out of town.

## 2015-10-23 MED ORDER — ALPRAZOLAM 0.5 MG PO TABS: ORAL_TABLET | ORAL | Status: AC

## 2015-11-11 ENCOUNTER — Ambulatory Visit (INDEPENDENT_AMBULATORY_CARE_PROVIDER_SITE_OTHER): Payer: BC Managed Care – PPO | Admitting: Family Medicine

## 2015-11-11 ENCOUNTER — Encounter: Payer: Self-pay | Admitting: Oncology

## 2015-11-11 VITALS — BP 120/80 | HR 90 | Temp 98.6°F | Resp 17 | Ht 63.0 in | Wt 122.0 lb

## 2015-11-11 DIAGNOSIS — F329 Major depressive disorder, single episode, unspecified: Secondary | ICD-10-CM

## 2015-11-11 DIAGNOSIS — R0981 Nasal congestion: Secondary | ICD-10-CM

## 2015-11-11 DIAGNOSIS — J309 Allergic rhinitis, unspecified: Secondary | ICD-10-CM | POA: Diagnosis not present

## 2015-11-11 DIAGNOSIS — Z119 Encounter for screening for infectious and parasitic diseases, unspecified: Secondary | ICD-10-CM

## 2015-11-11 DIAGNOSIS — F32A Depression, unspecified: Secondary | ICD-10-CM

## 2015-11-11 DIAGNOSIS — G4700 Insomnia, unspecified: Secondary | ICD-10-CM | POA: Diagnosis not present

## 2015-11-11 MED ORDER — FLUTICASONE PROPIONATE 50 MCG/ACT NA SUSP: 2.0000 | Freq: Every day | NASAL | Status: DC

## 2015-11-11 MED ORDER — SERTRALINE HCL 50 MG PO TABS: 50.0000 mg | ORAL_TABLET | Freq: Every day | ORAL | Status: DC

## 2015-11-11 MED ORDER — CLONAZEPAM 1 MG PO TABS: 1.0000 mg | ORAL_TABLET | Freq: Every day | ORAL | Status: DC

## 2015-11-11 MED ORDER — MIRTAZAPINE 15 MG PO TABS: 15.0000 mg | ORAL_TABLET | Freq: Every day | ORAL | Status: DC

## 2015-11-11 MED ORDER — CLARITHROMYCIN ER 500 MG PO TB24: 1000.0000 mg | ORAL_TABLET | Freq: Every day | ORAL | Status: DC

## 2015-11-11 NOTE — Progress Notes (Signed)
By signing my name below, I, Mesha Guinyard, attest that this documentation has been prepared under the direction and in the presence of Delman Cheadle, MD.  Electronically Signed: Verlee Monte, Medical Scribe. 11/11/2015. 10:40 AM.  Subjective:    Patient ID: Beth Rice, female    DOB: 09/27/1955, 61 y.o.   MRN: [id number]  HPI Chief Complaint  Patient presents with  . Sinusitis  . Depression    HPI Comments: Beth Rice is a 60 y.o. female who presents to the [facility] complaining of sinus congestion. Pt reports frontal HAs, productive cough with yellow/greens sputum, rhinorrhea, sneezing, SOB, and fever. Pt states she doesn't have a fever today. Pt has been using flonase. Pt uses zanac QD in the am for muscles spasms in her back. Pt would like to get screened for Hep C and plans on getting it done Monday at another site.  She's under treatment for Breast CA and a long time pt of Dr. Everlene Farrier. She's finishing her chemo therapy- 2 months. Pt has a follow up Monday for her chemo and it's been making her feel depressed since she doesn't know what's going to happen next. Pt has been battling depression before her breast CA dx. She has a hx of smoking- stopped smoking in either 2015 or 2015.  Pt takes klonopin QD at night to help her sleep for about 10 years. Pt thinks her klonopin isn't working anymore. When she didn't takes it for 2 days she got very irritated and everything bothered her. Pt has some anxiety throughout the day. She reports when the battery in her fire detector goes off she gets "worked up inside". After she takes the battery out, she's okay. Pt mentions small things bother her now, but they didn't bother her in the past before. She was on zoloft 50 mg. Her oncologist increased it to 100 mg, but that was causing her to feel gittery so she split it to 50 mg BID, but still list insominia. So now has gone back to 50 mg QHS Pt has lost her appetite, and would  like to get it back.   Patient Active Problem List   Diagnosis Date Noted  . Macrocytic anemia 11/25/2014  . Leg edema 11/25/2014  . Vaginal dryness 11/11/2014  . Antineoplastic chemotherapy induced anemia 11/11/2014  . 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 Chemotherapy induced nausea and vomiting 10/21/2014  . Insomnia 10/01/2014  . Dehydration 10/01/2014  . Chemotherapy induced diarrhea 10/01/2014  . Reflux 10/01/2014  . Mucositis due to chemotherapy 10/01/2014  . Thrush 10/01/2014  . Hypersensitivity reaction 09/23/2014  . Family history of malignant neoplasm of ovary 09/19/2014  . Family history of malignant neoplasm of gastrointestinal tract 09/19/2014  . Breast cancer of upper-inner quadrant of right female breast ([place]) 09/03/2014  . Abnormality of gait 08/14/2014  . Tobacco abuse 06/25/2014  . Chest pain 06/03/2014  . Lumbar pain 10/25/2013  . Other and unspecified hyperlipidemia 10/02/2012  . Depression 10/02/2012   Past Medical History  Diagnosis Date  . Anxiety   . Elevated cholesterol   . Depression   . Chest pain     a. 09/2008 Cath: ER 60%, LM nl, LAD 71m, 84m/d, LCX nondom, nl, RCA dom, 20m, PDA/PLA nl.  . Tobacco abuse   . H/O degenerative disc disease   . Headache     "frequency depends on the weather" (06/04/2014)  . Arthritis     "neck" (06/04/2014)  . Chronic lower back  pain   . Gastroesophageal reflux disease     H/O  . Hot flashes   . Myocardial infarction [facility]) 2010    "I think I had a heart attack in 2010" cath showed small blockage- no treatment  . Cancer ([place]) 08/2014    Rt breast  . Anemia 12/2014    due to chemotherapy -received blood transfusion   Past Surgical History  Procedure Laterality Date  . Ovarian cyst removal  1980's?  . Tonsillectomy    . Cardiac catheterization  10/15/2008    noncritical mid & distal third anterior descending disease  . Portacath placement Left 09/17/2014    Procedure: INSERTION PORT-A-CATH;   Surgeon: [name], MD;  Location: [place];  Service: General;  Laterality: Left;  . Breast lumpectomy with radioactive seed and sentinel lymph node biopsy Right 01/21/2015    Procedure: BREAST LUMPECTOMY WITH RADIOACTIVE SEED AND SENTINEL LYMPH NODE BIOPSY;  Surgeon: [name], MD;  Location: [place];  Service: General;  Laterality: Right;  . Port-a-cath removal N/A 09/16/2015    Procedure: REMOVAL PORT-A-CATH;  Surgeon: [name], MD;  Location: [place];  Service: General;  Laterality: N/A;   Allergies  Allergen Reactions  . Statins Other (See Comments)    Patient has significant muscle cramps with statins. She cannot tolerate them.  . Penicillins Hives  . Sulfa Antibiotics Hives   Prior to Admission medications   Medication Sig Start Date End Date Taking? Authorizing Provider  acyclovir (ZOVIRAX) 400 MG tablet Take 1 tablet (400 mg total) by mouth 2 (two) times daily. 09/30/14  Yes Chauncey Cruel, MD  ALPRAZolam Duanne Moron) 0.5 MG tablet Take 1 tablet every morning 10/23/15  Yes Darlyne Russian, MD  aspirin EC 81 MG tablet Take 81 mg by mouth daily.   Yes Historical Provider, MD  Calcium Carbonate-Vitamin D (CALCIUM + D PO) Take 1 tablet by mouth at bedtime.    Yes Historical Provider, MD  clonazePAM (KLONOPIN) 1 MG tablet Take 1 tablet (1 mg total) by mouth at bedtime. 10/17/15  Yes Chelle Jeffery, PA-C  fluticasone (FLONASE) 50 MCG/ACT nasal spray Place 2 sprays into both nostrils daily. 06/18/14  Yes Ezekiel Slocumb, PA-C  gabapentin (NEURONTIN) 100 MG capsule Take 1 capsule (100 mg total) by mouth 3 (three) times daily. Take 1-3 capsules up to three times daily Patient taking differently: Take 100 mg by mouth daily.  02/11/15  Yes Chauncey Cruel, MD  loratadine (CLARITIN) 10 MG tablet Take 10 mg by mouth daily.   Yes Historical Provider, MD  Lysine 500 MG CAPS Take 500 mg by mouth at bedtime.    Yes Historical Provider, MD  Multiple Vitamins-Minerals  (MULTIVITAMIN WITH MINERALS) tablet Take 1 tablet by mouth at bedtime.    Yes Historical Provider, MD  omeprazole (PRILOSEC) 40 MG capsule Take 1 capsule (40 mg total) by mouth daily. 12/30/14  Yes Laurie Panda, NP  oxyCODONE-acetaminophen (ROXICET) 5-325 MG tablet Take 1-2 tablets by mouth every 4 (four) hours as needed for severe pain. 09/16/15  Yes [name], MD  sertraline (ZOLOFT) 100 MG tablet Take 1 tablet (100 mg total) by mouth daily. 05/30/15  Yes Chauncey Cruel, MD   Social History   Social History  . Marital Status: Single    Spouse Name: N/A  . Number of Children: N/A  . Years of Education: N/A   Occupational History  . Not on file.   Social History Main Topics  .  Smoking status: Former Smoker -- 1.00 packs/day for 20 years    Types: Cigarettes    Quit date: 06/21/2014  . Smokeless tobacco: Never Used  . Alcohol Use: 0.0 oz/week    0 Standard drinks or equivalent per week     Comment: Rare  . Drug Use: No  . Sexual Activity: No     Comment: intercourse age 36, sexual partners less than 5   Other Topics Concern  . Not on file   Social History Narrative   Lives in [place] with her 4 dogs.  She does not routinely exercise as activity is limited by chronic back pain.   Depression screen [facility] 2/9 11/11/2015 05/16/2015 04/11/2015 02/19/2015 10/23/2013  Decreased Interest 2 0 2 0 0  Down, Depressed, Hopeless 2 0 1 0 1  PHQ - 2 Score 4 0 3 0 1  Altered sleeping 2 - 2 - -  Tired, decreased energy 2 - 3 - -  Change in appetite 2 - 3 - -  Feeling bad or failure about yourself  0 - 0 - -  Trouble concentrating 2 - 3 - -  Moving slowly or fidgety/restless 2 - 2 - -  Suicidal thoughts 0 - 0 - -  PHQ-9 Score 14 - 16 - -  Difficult doing work/chores Very difficult - Very difficult - -    Review of Systems  Constitutional: Positive for fever (not today) and appetite change (dec).  HENT: Positive for congestion, rhinorrhea, sinus pressure and sneezing.   Respiratory:  Positive for cough and shortness of breath.   Neurological: Positive for headaches.    Objective:  BP 120/80 mmHg  Pulse 90  Temp(Src) 98.6 F (37 C) (Oral)  Resp 17  Ht 5\' 3"  (1.6 m)  Wt 122 lb (55.339 kg)  BMI 21.62 kg/m2  SpO2 96%  Physical Exam  Constitutional: She appears well-developed and well-nourished. No distress.  HENT:  Head: Normocephalic and atraumatic.  Right Ear: Tympanic membrane is retracted.  Left Ear: Tympanic membrane is injected and retracted.  Mouth/Throat: Posterior oropharyngeal edema present.  Nares nl  Eyes: Conjunctivae are normal.  Neck: Neck supple. No thyroid mass and no thyromegaly present.  Tonsillar adenopathy R>L Anterior cervial R>L Interior cervial nl  Cardiovascular: Regular rhythm, S1 normal and S2 normal.  Tachycardia present.  Exam reveals no gallop.   No murmur heard. Pulmonary/Chest: Effort normal and breath sounds normal. No respiratory distress. She has no wheezes. She has no rales.  Neurological: She is alert.  Skin: Skin is warm and dry.  Psychiatric: She has a normal mood and affect. Her behavior is normal.  Nursing note and vitals reviewed.   Assessment & Plan:  Pt will see Dr. Jana Hakim Monday for blood draw will ask for HepC and HIV screening then 1. Sinus congestion   2. Insomnia - has been on klonopin qhs x >10 yrs, refilled.  Hopefully remeron will also help as pt would also like an increased appetite  3. Depression - on zoloft 50 qd - side effects such as jittery, insomnia which she increased dose to 100mg  but still depressed - add in remeron qhs  4. Allergic rhinitis, unspecified allergic rhinitis type   5. Screening examination for infectious disease     Orders Placed This Encounter  Procedures  . HIV antibody    Standing Status: Future     Number of Occurrences:      Standing Expiration Date: 11/10/2016  . Hepatitis C Antibody  Standing Status: Future     Number of Occurrences:      Standing Expiration  Date: 11/10/2016    Meds ordered this encounter  Medications  . mirtazapine (REMERON) 15 MG tablet    Sig: Take 1 tablet (15 mg total) by mouth at bedtime.    Dispense:  90 tablet    Refill:  0  . sertraline (ZOLOFT) 50 MG tablet    Sig: Take 1 tablet (50 mg total) by mouth daily.    Dispense:  90 tablet    Refill:  1  . fluticasone (FLONASE) 50 MCG/ACT nasal spray    Sig: Place 2 sprays into both nostrils daily.    Dispense:  16 g    Refill:  11  . clonazePAM (KLONOPIN) 1 MG tablet    Sig: Take 1 tablet (1 mg total) by mouth at bedtime.    Dispense:  90 tablet    Refill:  1    Not to exceed 5 additional fills before 09/07/2015  . clarithromycin (BIAXIN XL) 500 MG 24 hr tablet    Sig: Take 2 tablets (1,000 mg total) by mouth daily.    Dispense:  20 tablet    Refill:  0    I personally performed the services described in this documentation, which was scribed in my presence. The recorded information has been reviewed and considered, and addended by me as needed.   Delman Cheadle, M.D.  [facility] [address] [place], [place] [address] [phone] phone [phone] fax  11/28/2015 9:00 AM

## 2015-11-11 NOTE — Patient Instructions (Signed)
Take the zoloft 50mg  every morning and then start the mirtazapine every night along with the klonopin.  Adjustment Disorder Adjustment disorder is an unusually severe reaction to a stressful life event, such as the loss of a job or physical illness. The event may be any stressful event other than the loss of a loved one. Adjustment disorder may affect your feelings, your thinking, how you act, or a combination of these. It may interfere with personal relationships or with the way you are at work, school, or home. People with this disorder are at risk for suicide and substance abuse. They may develop a more serious mental disorder, such as major depressive disorder or post-traumatic stress disorder. SIGNS AND SYMPTOMS  Symptoms may include:  Sadness, depressed mood, or crying spells.  Loss of enjoyment.  Change in appetite or weight.  Sense of loss or hopelessness.  Thoughts of suicide.  Anxiety, worry, or nervousness.  Trouble sleeping.  Avoiding family and friends.  Poor school performance.  Fighting or vandalism.  Reckless driving.  Skipping school.  Poor work Systems analyst.  Ignoring bills. Symptoms of adjustment disorder start within 3 months of the stressful life event. They do not last more than 6 months after the event has ended. DIAGNOSIS  To make a diagnosis, your health care provider will ask about what has happened in your life and how it has affected you. He or she may also ask about your medical history and use of medicines, alcohol, and other substances. Your health care provider may do a physical exam and order lab tests or other studies. You may be referred to a mental health specialist for evaluation. TREATMENT  Treatment options include:  Counseling or talk therapy. Talk therapy is usually provided by mental health specialists.  Medicine. Certain medicines may help with depression, anxiety, and sleep.  Support groups. Support groups offer emotional support,  advice, and guidance. They are made up of people who have had similar experiences. HOME CARE INSTRUCTIONS  Keep all follow-up visits as directed by your health care provider. This is important.  Take medicines only as directed by your health care provider. SEEK MEDICAL CARE IF:  Your symptoms get worse.  SEEK IMMEDIATE MEDICAL CARE IF: You have serious thoughts about hurting yourself or someone else. MAKE SURE YOU:  Understand these instructions.  Will watch your condition.  Will get help right away if you are not doing well or get worse.   This information is not intended to replace advice given to you by your health care provider. Make sure you discuss any questions you have with your health care provider.   Document Released: 01/19/2006 Document Revised: 06/07/2014 Document Reviewed: 10/09/2013 Elsevier Interactive Patient Education [employer].

## 2015-11-12 ENCOUNTER — Other Ambulatory Visit: Payer: Self-pay | Admitting: *Deleted

## 2015-11-14 ENCOUNTER — Other Ambulatory Visit: Payer: Self-pay | Admitting: *Deleted

## 2015-11-14 DIAGNOSIS — C50211 Malignant neoplasm of upper-inner quadrant of right female breast: Secondary | ICD-10-CM

## 2015-11-17 ENCOUNTER — Ambulatory Visit (HOSPITAL_BASED_OUTPATIENT_CLINIC_OR_DEPARTMENT_OTHER): Payer: BC Managed Care – PPO | Admitting: Oncology

## 2015-11-17 ENCOUNTER — Other Ambulatory Visit (HOSPITAL_BASED_OUTPATIENT_CLINIC_OR_DEPARTMENT_OTHER): Payer: BC Managed Care – PPO

## 2015-11-17 VITALS — BP 123/81 | HR 101 | Resp 18 | Ht 63.0 in | Wt 125.3 lb

## 2015-11-17 DIAGNOSIS — C50411 Malignant neoplasm of upper-outer quadrant of right female breast: Secondary | ICD-10-CM | POA: Diagnosis not present

## 2015-11-17 DIAGNOSIS — Z170 Estrogen receptor positive status [ER+]: Secondary | ICD-10-CM | POA: Diagnosis not present

## 2015-11-17 DIAGNOSIS — R918 Other nonspecific abnormal finding of lung field: Secondary | ICD-10-CM | POA: Diagnosis not present

## 2015-11-17 DIAGNOSIS — C50211 Malignant neoplasm of upper-inner quadrant of right female breast: Secondary | ICD-10-CM

## 2015-11-17 LAB — CBC WITH DIFFERENTIAL/PLATELET
BASO%: 0.7 % (ref 0.0–2.0)
BASOS ABS: 0 10*3/uL (ref 0.0–0.1)
EOS ABS: 0.1 10*3/uL (ref 0.0–0.5)
EOS%: 0.8 % (ref 0.0–7.0)
HEMATOCRIT: 38.3 % (ref 34.8–46.6)
HEMOGLOBIN: 13.2 g/dL (ref 11.6–15.9)
LYMPH#: 1.4 10*3/uL (ref 0.9–3.3)
LYMPH%: 22.8 % (ref 14.0–49.7)
MCH: 34.5 pg — AB (ref 25.1–34.0)
MCHC: 34.5 g/dL (ref 31.5–36.0)
MCV: 99.9 fL (ref 79.5–101.0)
MONO#: 0.3 10*3/uL (ref 0.1–0.9)
MONO%: 4.4 % (ref 0.0–14.0)
NEUT#: 4.3 10*3/uL (ref 1.5–6.5)
NEUT%: 71.3 % (ref 38.4–76.8)
Platelets: 239 10*3/uL (ref 145–400)
RBC: 3.83 10*6/uL (ref 3.70–5.45)
RDW: 13.1 % (ref 11.2–14.5)
WBC: 6 10*3/uL (ref 3.9–10.3)

## 2015-11-17 LAB — COMPREHENSIVE METABOLIC PANEL
ALBUMIN: 3.8 g/dL (ref 3.5–5.0)
ALK PHOS: 79 U/L (ref 40–150)
ALT: 21 U/L (ref 0–55)
AST: 28 U/L (ref 5–34)
Anion Gap: 10 mEq/L (ref 3–11)
BUN: 17.6 mg/dL (ref 7.0–26.0)
CALCIUM: 9.3 mg/dL (ref 8.4–10.4)
CO2: 29 mEq/L (ref 22–29)
Chloride: 102 mEq/L (ref 98–109)
Creatinine: 0.9 mg/dL (ref 0.6–1.1)
EGFR: 72 mL/min/{1.73_m2} — AB (ref >90)
Glucose: 149 mg/dl — ABNORMAL HIGH (ref 70–140)
POTASSIUM: 3.7 meq/L (ref 3.5–5.1)
Sodium: 141 mEq/L (ref 136–145)
Total Bilirubin: 0.37 mg/dL (ref 0.20–1.20)
Total Protein: 7.5 g/dL (ref 6.4–8.3)

## 2015-11-17 NOTE — Progress Notes (Signed)
Beth Rice  [phone] Fax:([phone]     ID: Beth Rice DOB: 1955-10-05  MR#: [id number]  TWK#:[id number]  Patient Care Team: Mayra Neer, MD as PCP - General (Family Medicine) [name], MD as Consulting Physician (General Surgery) Chauncey Cruel, MD as Consulting Physician (Oncology) Eppie Gibson, MD as Attending Physician (Radiation Oncology) Rockwell Germany, RN as Registered Nurse Mauro Kaufmann, RN as Registered Nurse PCP: Mayra Neer, MD OTHER MD: Jamie Kato MD  CHIEF COMPLAINT: HER-2 positive breast cancer  CURRENT TREATMENT: completing anti-HER-2 immunotherapy 09/15/2015  BREAST CANCER HISTORY: From the original intake note:  Beth Rice herself palpated a mass in her right breast but "didn't think much about it". She saw [name] for routine gynecologic follow-up and he also palpated and immediately set her up for right diagnostic mammography with tomosynthesis and ultrasonography at the [facility] Center 08/27/2014. In the upper outer quadrant of the right breast there was a spiculated mass accompanied by calcifications, the complex extending up to 2.5 cm. This was palpable and mobile. It measured approximately 2 cm by palpation. There was no palpable right axillary adenopathy. Ultrasound of the right axilla also showed normal axillary contents.  Ultrasound of the right breast confirmed an irregularly marginated hypoechoic mass measuring 1.8 cm. Biopsy was not immediately performed because of patient was on aspirin at the time. On 08/30/2014 Beth Rice underwent biopsy of the mass in question, with the pathology ([id number]) (showing an invasive ductal carcinoma, grade 2, estrogen receptor 2% positive with moderate staining intensity, progesterone receptor negative, with an MIB-1 of 81% and with HER-2 amplified, the signals ratio being 2.87 and the number per cell 4.30.  On 09/03/2024 she underwent bilateral breast MRI. This found  the breast composition to be category C. In the right breast there was a spiculated mass measuring 2.3 cm in close proximity to the pectoralis but without obvious invasion of the muscle. The rest of the breast, the left breast, and the lymph node areas well otherwise unremarkable.  Her subsequent history is as detailed below   INTERVAL HISTORY: Beth Rice returns today for follow up of her HER-2 positive breast cancer accompanied by her friend Beth Rice. Beth Rice is receiving her last dose of trastuzumab today. She has tolerated this remarkably well. She has had no side effects that she is unclear. Her most recent echocardiogram shows a well-preserved ejection fraction.  REVIEW OF SYSTEMS: She exercises by walking a little and doing yard work. She is having some cough, which she attributes to seasonal allergies. She has occasional headaches. These are not more intense or persistent than usual. A detailed review of systems today was otherwise noncontributory  PAST MEDICAL HISTORY: Past Medical History  Diagnosis Date  . Anxiety   . Elevated cholesterol   . Depression   . Chest pain     a. 09/2008 Cath: ER 60%, LM nl, LAD 73m 464m, LCX nondom, nl, RCA dom, 2053mDA/PLA nl.  . Tobacco abuse   . H/O degenerative disc disease   . Headache     "frequency depends on the weather" (06/04/2014)  . Arthritis     "neck" (06/04/2014)  . Chronic lower back pain   . Gastroesophageal reflux disease     H/O  . Hot flashes   . [facility]    "I think I had a heart attack in 2010" cath showed small blockage- no treatment  . [facility] [place]    Rt breast  . Anemia 12/2014  due to chemotherapy -received blood transfusion    PAST SURGICAL HISTORY: Past Surgical History  Procedure Laterality Date  . Ovarian cyst removal  1980's?  . Tonsillectomy    . Cardiac catheterization  10/15/2008    noncritical mid & distal third anterior descending disease  . Portacath placement Left 09/17/2014     Procedure: INSERTION PORT-A-CATH;  Surgeon: [name], MD;  Location: [place];  Service: General;  Laterality: Left;  . Breast lumpectomy with radioactive seed and sentinel lymph node biopsy Right 01/21/2015    Procedure: BREAST LUMPECTOMY WITH RADIOACTIVE SEED AND SENTINEL LYMPH NODE BIOPSY;  Surgeon: [name], MD;  Location: [place];  Service: General;  Laterality: Right;  . Port-a-cath removal N/A 09/16/2015    Procedure: REMOVAL PORT-A-CATH;  Surgeon: [name], MD;  Location[facility];  Service: General;  Laterality: N/A;    FAMILY HISTORY Family History  Problem Relation Age of Onset  . Hypertension Sister   . Heart disease Mother   . Alzheimer's disease Mother   . Cerebral aneurysm Father   . Mental illness Father   . Ovarian cancer Maternal Aunt   The patient's father died at the age of 38 after having cerebral hemorrhages and age 32 and 97. The patient's mother died at the age of 98 with Parkinson's and Alzheimer's disease. The patient's mother had one sister and this sister was diagnosed with ovarian cancer in her early 39s. There is no other history of breast or ovarian cancer in the family.   GYNECOLOGIC HISTORY:  No LMP recorded. Patient is postmenopausal. menarche age 17, the patient is GX P0. She stopped having periods in 2005. She did not take hormone replacement.   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 She is in process of retiring, with targeted retirement date in June. She is single, lives alone with 4 dogs.    ADVANCED DIRECTIVES: Not in place. She tells me she plans to name her sister Beth Rice as her healthcare power of attorney. Beth Rice (who is a Marine scientist) can be reached in [place] at [phone] (cell) or [phone] (home).   HEALTH MAINTENANCE: Social History  Substance Use Topics  . Smoking status: Former Smoker -- 1.00 packs/day for 20  years    Types: Cigarettes    Quit date: 06/21/2014  . Smokeless tobacco: Never Used  . Alcohol Use: 0.0 oz/week    0 Standard drinks or equivalent per week     Comment: Rare     Colonoscopy: 2006/[facility]  ERX:VQMGQ 2016   B[facility] density:09/10/[facility]/normal   Lipid panel:  Allergies  Allergen Reactions  . Statins Other (See Comments)    Patient has significant muscle cramps with statins. She cannot tolerate them.  . Penicillins Hives  . Sulfa Antibiotics Hives    Current Outpatient Prescriptions  Medication Sig Dispense Refill  . acyclovir (ZOVIRAX) 400 MG tablet Take 1 tablet (400 mg total) by mouth 2 (two) times daily. 60 tablet 3  . ALPRAZolam (XANAX) 0.5 MG tablet Take 1 tablet every morning 30 tablet 2  . aspirin EC 81 MG tablet Take 81 mg by mouth daily.    . Calcium Carbonate-Vitamin D (CALCIUM + D PO) Take 1 tablet by mouth at bedtime.     . clarithromycin (BIAXIN XL) 500 MG 24 hr tablet Take 2 tablets (1,000 mg total) by mouth daily. 20 tablet 0  . clonazePAM (KLONOPIN)  1 MG tablet Take 1 tablet (1 mg total) by mouth at bedtime. 90 tablet 1  . fluticasone (FLONASE) 50 MCG/ACT nasal spray Place 2 sprays into both nostrils daily. 16 g 11  . gabapentin (NEURONTIN) 100 MG capsule Take 1 capsule (100 mg total) by mouth 3 (three) times daily. Take 1-3 capsules up to three times daily (Patient taking differently: Take 100 mg by mouth daily. ) 60 capsule 2  . loratadine (CLARITIN) 10 MG tablet Take 10 mg by mouth daily.    Marland Kitchen Lysine 500 MG CAPS Take 500 mg by mouth at bedtime.     . mirtazapine (REMERON) 15 MG tablet Take 1 tablet (15 mg total) by mouth at bedtime. 90 tablet 0  . Multiple Vitamins-Minerals (MULTIVITAMIN WITH MINERALS) tablet Take 1 tablet by mouth at bedtime.     Marland Kitchen omeprazole (PRILOSEC) 40 MG capsule Take 1 capsule (40 mg total) by mouth daily. 30 capsule 2  . oxyCODONE-acetaminophen (ROXICET) 5-325 MG tablet Take 1-2 tablets by mouth  every 4 (four) hours as needed for severe pain. 20 tablet 0  . sertraline (ZOLOFT) 50 MG tablet Take 1 tablet (50 mg total) by mouth daily. 90 tablet 1   No current facility-administered medications for this visit.    OBJECTIVE: middle-aged white woman Who appears stated age 60 Vitals:   11/17/15 1312  BP: 123/81  Pulse: 101  Resp: 18     Body mass index is 22.2 kg/(m^2).    ECOG FS:0 - Asymptomatic  Sclerae unicteric, pupils round and equal Oropharynx clear and moist-- no thrush or other lesions No cervical or supraclavicular adenopathy Lungs no rales or rhonchi Heart regular rate and rhythm Abd soft, nontender, positive bowel sounds MSK no focal spinal tenderness, no upper extremity lymphedema Neuro: nonfocal, well oriented, appropriate affect Breasts: The right breast is status post lumpectomy followed by radiation. There is an area just above the scar feels a little full. This is soft. It is clearly not cancer. It doesn't do not feel like a small seroma. It feels more like a fat pocket. This merely requires follow-up. The right axilla is benign. The left breast is unremarkable   LAB RESULTS:  CMP     Component Value Date/Time   NA 141 11/17/2015 1248   NA 140 09/08/2015 0956   K 3.7 11/17/2015 1248   K 4.3 09/08/2015 0956   CL 102 09/08/2015 0956   CO2 29 11/17/2015 1248   CO2 27 09/08/2015 0956   GLUCOSE 149* 11/17/2015 1248   GLUCOSE 107* 09/08/2015 0956   BUN 17.6 11/17/2015 1248   BUN 8 09/08/2015 0956   CREATININE 0.9 11/17/2015 1248   CREATININE 0.77 09/08/2015 0956   CREATININE 0.73 06/18/2014 1133   CALCIUM 9.3 11/17/2015 1248   CALCIUM 9.6 09/08/2015 0956   PROT 7.5 11/17/2015 1248   PROT 6.8 10/23/2013 1030   ALBUMIN 3.8 11/17/2015 1248   ALBUMIN 4.5 10/23/2013 1030   AST 28 11/17/2015 1248   AST 25 10/23/2013 1030   ALT 21 11/17/2015 1248   ALT 16 10/23/2013 1030   ALKPHOS 79 11/17/2015 1248   ALKPHOS 68 10/23/2013 1030   BILITOT 0.37  11/17/2015 1248   BILITOT 0.7 10/23/2013 1030   GFRNONAA >60 09/08/2015 0956   GFRNONAA >89 10/23/2013 1030   GFRAA >60 09/08/2015 0956   GFRAA >89 10/23/2013 1030    INo results found for: SPEP, UPEP  Lab Results  Component Value Date   WBC 6.0 11/17/2015  NEUTROABS 4.3 11/17/2015   HGB 13.2 11/17/2015   HCT 38.3 11/17/2015   MCV 99.9 11/17/2015   PLT 239 11/17/2015      Chemistry      Component Value Date/Time   NA 141 11/17/2015 1248   NA 140 09/08/2015 0956   K 3.7 11/17/2015 1248   K 4.3 09/08/2015 0956   CL 102 09/08/2015 0956   CO2 29 11/17/2015 1248   CO2 27 09/08/2015 0956   BUN 17.6 11/17/2015 1248   BUN 8 09/08/2015 0956   CREATININE 0.9 11/17/2015 1248   CREATININE 0.77 09/08/2015 0956   CREATININE 0.73 06/18/2014 1133      Component Value Date/Time   CALCIUM 9.3 11/17/2015 1248   CALCIUM 9.6 09/08/2015 0956   ALKPHOS 79 11/17/2015 1248   ALKPHOS 68 10/23/2013 1030   AST 28 11/17/2015 1248   AST 25 10/23/2013 1030   ALT 21 11/17/2015 1248   ALT 16 10/23/2013 1030   BILITOT 0.37 11/17/2015 1248   BILITOT 0.7 10/23/2013 1030       No results found for: LABCA2  No components found for: LABCA125  No results for input(s): INR in the last 168 hours.  Urinalysis    Component Value Date/Time   COLORURINE YELLOW 12/23/2014 1335   APPEARANCEUR CLEAR 12/23/2014 1335   LABSPEC 1.007 12/23/2014 1335   PHURINE 6.0 12/23/2014 1335   GLUCOSEU NEGATIVE 12/23/2014 1335   HGBUR NEGATIVE 12/23/2014 1335   BILIRUBINUR NEGATIVE 12/23/2014 1335   BILIRUBINUR neg 06/18/2014 1103   KETONESUR NEGATIVE 12/23/2014 1335   PROTEINUR NEGATIVE 12/23/2014 1335   PROTEINUR neg 06/18/2014 1103   UROBILINOGEN 0.2 12/23/2014 1335   UROBILINOGEN 0.2 06/18/2014 1103   NITRITE NEGATIVE 12/23/2014 1335   NITRITE neg 06/18/2014 1103   LEUKOCYTESUR NEGATIVE 12/23/2014 1335    STUDIES: CLINICAL DATA: Status post right lumpectomy for breast carcinoma in April of last  year. No current complaints.  EXAM: 2D DIGITAL DIAGNOSTIC BILATERAL MAMMOGRAM WITH CAD AND ADJUNCT TOMO  COMPARISON: Previous exam(s).  ACR Breast Density Category c: The breast tissue is heterogeneously dense, which may obscure small masses.  FINDINGS: There is a seroma in the lumpectomy site associated architectural distortion and surgical vascular clips. There is no evidence of residual or recurrent breast carcinoma.  There are no discrete masses or other areas of architectural distortion. There are no suspicious calcifications.  Mammographic images were processed with CAD.  IMPRESSION: No evidence of recurrent or new breast malignancy. Benign postsurgical changes on the right.  RECOMMENDATION: Diagnostic mammography in 1 year per standard post lumpectomy protocol.  I have discussed the findings and recommendations with the patient. Results were also provided in writing at the conclusion of the visit. If applicable, a reminder letter will be sent to the patient regarding the next appointment.  BI-RADS CATEGORY 2: Benign.   Electronically Signed  By: Lajean Manes M.D.  On: 10/06/2015 12:35  CLINICAL DATA: Breast cancer. Radiation therapy. Persisting cough. Tobacco use.  EXAM: CT CHEST WITHOUT CONTRAST  TECHNIQUE: Multidetector CT imaging of the chest was performed following the standard protocol without IV contrast.  COMPARISON: 09/17/2014  FINDINGS: Mediastinum/Nodes: Mass or lumpectomy site in the right medial breast with marginal fiducials.  Scattered small mediastinal lymph nodes are present, but do not appear pathologic. Left anterior descending coronary artery atherosclerotic calcification. Prior right axillary dissection.  Lungs/Pleura: Peripheral interstitial accentuation anteriorly in the right upper lobe and right middle lobe likely due to prior radiation port.  Right lower lobe  pulmonary nodule on image 80 series  5 measures 0.6 by 0.6 cm. Adjacent 0.4 by 0.2 cm nodule on that same image.  0.3 by 0.2 cm peribronchovascular nodule peripherally in the left lower lobe on image 104/5. 4 mm subpleural nodule in the left lower lobe on image 96/5.  Mild emphysema.  Upper abdomen: Hypodense lesion in segment 7 of the liver, 5 mm diameter on image 85/2.  Musculoskeletal: Unremarkable  IMPRESSION: 1. 6 mm right lower lobe pulmonary nodule with adjacent 4 by 2 mm pulmonary nodule. Several tiny left lower lobe pulmonary nodules. Given the history of breast cancer, standard guidelines for follow up do not apply. Consider surveillance imaging in 3-6 months time. 2. Left anterior descending coronary artery atherosclerosis. 3. Postoperative findings in the medial right breast with fiducials around what appears to be a lumpectomy site. 4. Tiny 5 mm hypodense lesion in the dome of segment 7 of the liver, most likely a cyst or hemangioma but technically nonspecific. This can additionally be surveilled on follow up CT chest. 5. Mild emphysema.   Electronically Signed  By: Van Clines M.D.  On: 10/06/2015 16:25   ASSESSMENT: 60 y.o. [place] woman status post right breast upper inner qudrant biopsy 08/30/2014 for a clinical T2 N0, stage IIA invasive ductal carcinoma, grade 2, estrogen receptor 2% "positive," progesterone receptor negative, HER-2 positive, with an MIB-1 of 81%.  (1) neoadjuvant chemotherapy consisting of carboplatin, docetaxel, trastuzumab and pertuzumab x 6  beginning 09/23/14, completed 01/06/2015   (a) docetaxel switched to gemcitabine for cycles 5 and 6 because of acute bilateral lower extremity edema  (2) trastuzumab will be continued to complete 1 year (through April 2017)  (a) most recent echo 08/13/2015 shows an EF of 55% (stable)  (3) status post right lumpectomy and sentinel lymph node sampling 01/21/2015 showing a complete pathologic response  (4) adjuvant  radiation 02/27/2015-04/09/2015:  Right Breast. 50 Gy in 25 fractions. Right Breast Boost. 10 Gy in 5 fractions  (5) considered anti-estrogens but opted against them (for either treatment or prophylaxis)  (6) genetics testing 10/10/2014 through the [employer] gene panel offered by [facility] found no deleterious mutations in ATM, BARD1, BRCA1, BRCA2, BRIP1, CDH1, CHEK2, EPCAM, MLH1, MRE11A, MSH2, MSH6, MUTYH, NBN, NF1, PALB2, PMS2, PTEN, RAD50, RAD51C, RAD51D, SMARCA4, STK11, and TP53.   PLAN: Beth Rice is now a year out from definitive surgery for her breast cancer with no evidence of disease recurrence. This is very favorable.  We reviewed her mammogram and also her CT scan of the chest, which as noted above shows a few very small lesions. Because they are so small that I think registration may be a problem, I favor repeating the scan 6 months not 3 months since the last 1. Accordingly we will repeated again in November. If everything is stable we will repeat it one more time November of next year.  Otherwise I'm delighted that she is exercising regularly. She seems to be already benefiting from the Remeron she was started on. She is doing a good job of lowering your stress level all around.   She knows to call for any problems that may develop before her next visit here, which will be in November.Marland Kitchen   Chauncey Cruel, MD   11/17/2015 1:58 PM

## 2015-11-27 ENCOUNTER — Telehealth: Payer: Self-pay | Admitting: Family Medicine

## 2015-12-25 ENCOUNTER — Encounter: Payer: BC Managed Care – PPO | Admitting: Nurse Practitioner

## 2015-12-30 NOTE — Progress Notes (Signed)
CLINIC:  Survivorship   REASON FOR VISIT:  Routine follow-up post-treatment for a recent history of breast cancer.  BRIEF ONCOLOGIC HISTORY:    Breast cancer of upper-inner quadrant of right female breast ([place])   08/27/2014 Mammogram    Mammo/breast US: Spiculated mass in (R) breast at 12:30 with central pleomorphic calcs extending anteriorly. Mass & calcs span 2.5 cm.  US shows irregular hypoechoic mass at 12:30 in (R) breast measuring 1.8 x 1.7 x 1.7 cm.      09/02/2014 Initial Biopsy    (R) breast needle core biopsy: IDC, grade 2, DCIS.  ER+ (2%), PR- (0%), KI 67 81 %. HER2 (+) by CISH (ratio 2.87).     09/03/2014 Initial Diagnosis    Breast cancer of upper-inner quadrant of right female breast     09/04/2014 Breast MRI    Biopsy proven malignancy in the superior posterior right breast at the approximate 12:30 position measures up to 2.3 cm, & corresponds to mass & associated calcs seen mammographically spanning approximately 2.5 cm. No evidence of malignancy in (L) breast.     09/20/2014 Echocardiogram    EF 50-55%     09/23/2014 - 01/06/2015 Neo-Adjuvant Chemotherapy    Taxotere/Carbo/Herceptin/Perjeta x 6 cycles planned.  Taxotere switched to Gemcitabine after cycle #4 d/t side effects/lower extremity edema; pt went on to complete cycles #5 & #6 with Gemcitabine/Carbo/Herceptin/Perjeta.      10/10/2014 Miscellaneous    Genetic testing: Normal. Genes analyzed: ATM, BARD1, BRCA1, BRCA2, BRIP1, CDH1, CHEK2, EPCAM, MLH1, MRE11A, MSH2, MSH6, MUTYH, NBN, NF1, PALB2, PMS2, PTEN, RAD50, RAD51C, RAD51D, SMARCA4, STK11, and TP53     01/08/2015 Echocardiogram    LV EF: 50% -   55%     01/09/2015 Breast MRI    Essential complete tumor response to neoadjuvant chemo. Minimal residual enhancement at superior margin of bx clip in upper (R) breast. No evidence of malignancy elsewhere in either breast. No pathologic lymphadenopathy.      01/21/2015 Definitive Surgery    (R) lumpectomy with SLNB  ([place]): Fibrosis with focal calcs; no malignancy. Margins negative.  0/2 SLNs.   ypTX, ypN0     01/27/2015 - 09/15/2015 Chemotherapy    Maintenance Herceptin (to complete 1 year of therapy).      02/27/2015 - 04/19/2015 Radiation Therapy    Adjuvant breast radiation Isidore Moos). Right Breast. 50 Gy in 25 fractions. Right Breast Boost. 10 Gy in 5 fractions     05/06/2015 Echocardiogram    LV EF: 50%     05/2015 -  Anti-estrogen oral therapy    None.  Considered anti-estrogen therapy, but decided against them.      08/13/2015 Echocardiogram    LV EF: 55%     09/23/2015 Imaging    DEXA scan: Normal.       INTERVAL HISTORY:  Beth Rice presents to the [facility] today for our initial meeting to review her survivorship care plan detailing her treatment course for breast cancer, as well as monitoring long-term side effects of that treatment, education regarding health maintenance, screening, and overall wellness and health promotion.     Overall, Beth Rice reports feeling pretty well since completing her maintenance Herceptin treatment approximately 3 months ago.  She does report new right breast tenderness "right at my lumpectomy scar"; she feels like there is fluid under the skin; this new symptom has been going on for about 2 weeks.   She also recently was diagnosed with shingles to her right intramammary fold  and completed a 3-week course of Valtrex; she continues to have some post-herpetic neuralgia in those areas as a result.  She takes Neurontin. No new lesions.  She has some fatigue, but this is improving with time; she is exercising regularly with walking. She walks about 3 miles per day.  She has some taste changes, which are improving but still frustrating for her at times.  She denies hot flashes.  She endorses some anxiety and depression, but has a good support system of friends and family.   REVIEW OF SYSTEMS:  Review of Systems  Gastrointestinal: Negative for constipation,  diarrhea, nausea and vomiting.  Skin: Positive for rash.       Recent shingles diagnosis to (R) breast/abd area; treated with 3-week course of Valtrex; improving, but still with pain   Psychiatric/Behavioral: Positive for depression. The patient is nervous/anxious.   GU: Denies vaginal bleeding, discharge, or dryness.  Breast: No (L) breast changes; see HPI for new (R) breast concerns.   A 14-point review of systems was completed and was negative, except as noted above.   ONCOLOGY TREATMENT TEAM:  1. Surgeon:  Dr. Barry Dienes at [facility] 2. Medical Oncologist: Dr. Jana Hakim 3. Radiation Oncologist: Dr. Isidore Moos    PAST MEDICAL/SURGICAL HISTORY:  Past Medical History:  Diagnosis Date  . Anemia 12/2014   due to chemotherapy -received blood transfusion  . Anxiety   . Arthritis    "neck" (06/04/2014)  . Cancer ([place]) 08/2014   Rt breast  . Chest pain    a. 09/2008 Cath: ER 60%, LM nl, LAD 54m 48m, LCX nondom, nl, RCA dom, 2023mDA/PLA nl.  . Chronic lower back pain   . Depression   . Elevated cholesterol   . Gastroesophageal reflux disease    H/O  . H/O degenerative disc disease   . Headache    "frequency depends on the weather" (06/04/2014)  . Hot flashes   . [facility]   "I think I had a heart attack in 2010" cath showed small blockage- no treatment  . Tobacco abuse    Past Surgical History:  Procedure Laterality Date  . BREAST LUMPECTOMY WITH RADIOACTIVE SEED AND SENTINEL LYMPH NODE BIOPSY Right 01/21/2015   Procedure: BREAST LUMPECTOMY WITH RADIOACTIVE SEED AND SENTINEL LYMPH NODE BIOPSY;  Surgeon: FaeStark KleinD;  [facility]  Laterality: Right;  . CARDIAC CATHETERIZATION  10/15/2008   noncritical mid & distal third anterior descending disease  . OVARIAN CYST REMOVAL  1980's?  . PMarland KitchenRT-A-CATH REMOVAL N/A 09/16/2015   Procedure: REMOVAL PORT-A-CATH;  Surgeon: FaeStark KleinD;  Location[facility] Service: General;  Laterality: N/A;  . PORTACATH PLACEMENT Left 09/17/2014   Procedure: INSERTION PORT-A-CATH;  Surgeon: FaeStark KleinD;  [facility];  Laterality: Left;  . TONSILLECTOMY       ALLERGIES:  Allergies  Allergen Reactions  . Statins Other (See Comments)    Patient has significant muscle cramps with statins. She cannot tolerate them.  . Penicillins Hives  . Sulfa Antibiotics Hives     CURRENT MEDICATIONS:  Outpatient Encounter Prescriptions as of 12/31/2015  Medication Sig  . acyclovir (ZOVIRAX) 400 MG tablet Take 1 tablet (400 mg total) by mouth 2 (two) times daily.  . AMarland KitchenPRAZolam (XANAX) 0.5 MG tablet Take 1 tablet every morning  . aspirin EC 81 MG tablet Take 81 mg by mouth daily.  . Calcium Carbonate-Vitamin D (CALCIUM +  D PO) Take 1 tablet by mouth at bedtime.   . clarithromycin (BIAXIN XL) 500 MG 24 hr tablet Take 2 tablets (1,000 mg total) by mouth daily.  . clonazePAM (KLONOPIN) 1 MG tablet Take 1 tablet (1 mg total) by mouth at bedtime.  . fluticasone (FLONASE) 50 MCG/ACT nasal spray Place 2 sprays into both nostrils daily.  Marland Kitchen gabapentin (NEURONTIN) 100 MG capsule Take 1 capsule (100 mg total) by mouth 3 (three) times daily. Take 1-3 capsules up to three times daily (Patient taking differently: Take 100 mg by mouth daily. )  . loratadine (CLARITIN) 10 MG tablet Take 10 mg by mouth daily.  Marland Kitchen Lysine 500 MG CAPS Take 500 mg by mouth at bedtime.   . mirtazapine (REMERON) 15 MG tablet Take 1 tablet (15 mg total) by mouth at bedtime.  . Multiple Vitamins-Minerals (MULTIVITAMIN WITH MINERALS) tablet Take 1 tablet by mouth at bedtime.   Marland Kitchen omeprazole (PRILOSEC) 40 MG capsule Take 1 capsule (40 mg total) by mouth daily.  Marland Kitchen oxyCODONE-acetaminophen (ROXICET) 5-325 MG tablet Take 1-2 tablets by mouth every 4 (four) hours as needed for severe pain.  Marland Kitchen sertraline (ZOLOFT) 50 MG tablet Take 1 tablet (50 mg total) by mouth daily.   No  facility-administered encounter medications on file as of 12/31/2015.      ONCOLOGIC FAMILY HISTORY:  Family History  Problem Relation Age of Onset  . Hypertension Sister   . Heart disease Mother   . Alzheimer's disease Mother   . Cerebral aneurysm Father   . Mental illness Father   . Ovarian cancer Maternal Aunt      GENETIC COUNSELING/TESTING: 10/10/14-Genetic testing: Normal. Genes analyzed: ATM, BARD1, BRCA1, BRCA2, BRIP1, CDH1, CHEK2, EPCAM, MLH1, MRE11A, MSH2, MSH6, MUTYH, NBN, NF1, PALB2, PMS2, PTEN, RAD50, RAD51C, RAD51D, SMARCA4, STK11, and TP53  SOCIAL HISTORY:  ALGIE CALES is single and lives in [place], [place] with her 3 dogs.  She works as the Programmer, multimedia for the [employer] . She is a former smoker; quit in 05/2014.  She drinks alcohol occasionally.  She does not use illicit drugs.      PHYSICAL EXAMINATION:  Vital Signs: Vitals:   12/31/15 0827  BP: 109/83  Pulse: 94  Resp: 18  Temp: 98.5 F (36.9 C)   Filed Weights   12/31/15 0827  Weight: 127 lb 4.8 oz (57.7 kg)   General: Well-nourished, well-appearing female in no acute distress.  She is unaccompanied today.   HEENT: Head is normocephalic.  Pupils equal and reactive to light and accomodation. Conjunctivae clear without exudate.  Sclerae anicteric. Oral mucosa is pink, moist.  Oropharynx is pink without lesions or erythema.  Lymph: No cervical, supraclavicular, or infraclavicular lymphadenopathy noted on palpation.  Breast Exam:  -Right breast: Palpable mass measuring approximately 3 x 2 cm just inferior to lumpectomy scar at 12:00 position; very tender on palpation; does not feel suspicious for malignancy but rather for hematoma vs seroma.  (R) intramammary fold with very small healed patches of vesicles, consistent with recent h/o shingles.  No active shingles infection apparent on exam.  -Left breast: No palpable masses/nodularlity. No erythema, nipple  inversion, or discharge.  -Axilla: No lymphadenopathy palpated bilaterally.  Cardiovascular: Regular rate and rhythm.Marland Kitchen Respiratory: Clear to auscultation bilaterally. Chest expansion symmetric; breathing non-labored.  GI: Abdomen soft and round; non-tender, non-distended. Bowel sounds normoactive.   GU: Deferred.  Neuro: No focal deficits. Steady gait.  Psych: Mood and affect normal and  appropriate for situation.  Extremities: No edema. Skin: Warm and dry.  LABORATORY DATA:  None for this visit.  DIAGNOSTIC IMAGING:  None for this visit.      ASSESSMENT AND PLAN:  Ms.. Beth Rice is a pleasant 60 y.o. female with Stage IIA right breast invasive ductal carcinoma, ER+ (only 2%)/PR-/HER2 (+), diagnosed in 08/2014; treated with neoadjuvant chemo with Taxotere/Carbo/Herceptin/Perjeta x 4 cycles, then Taxotere was switched to Gemcitabine for remaining 2 cycles due to lower extremity edema.  She then went on to have lumpectomy showing no remaining malignancy on path.  She then underwent adjuvant radiation therapy and completed her 1-year of maintenance Herceptin on 09/15/15.  Anti-estrogen therapy was considered for her, but decided against them.  She presents to the Survivorship Clinic for our initial meeting and routine follow-up post-completion of treatment for breast cancer.    1. Stage IIA right/left breast cancer:  Ms. Ardila is continuing to recover from definitive treatment for breast cancer. We will work-up the new (R) breast finding, but I am not suspicious of cancer recurrence (see #2 below).  She will follow-up with her medical oncologist, Dr. Jana Hakim 03/2016 with history and physical exam per surveillance protocol.  Today, a comprehensive survivorship care plan and treatment summary was reviewed with the patient today detailing her breast cancer diagnosis, treatment course, potential late/long-term effects of treatment, appropriate follow-up care with recommendations for the future, and  patient education resources.  A copy of this summary, along with a letter will be sent to the patient's primary care provider via mail/fax/In Basket message after today's visit.    2. New (R) breast mass:  I will send her for diagnostic mammogram and ultrasound today at the [facility] for the new palpable findings in her right breast. I am not suspicious of cancer recurrence, as the mass does not palpate as a cancer and feels more like a hematoma vs seroma.  She denies any recent trauma to the area of concern.  Her symptoms began about 2 weeks ago.  I will await results of mammogram/ultrasound and will likely send her back to Dr. Barry Dienes or for ultrasound-guided aspiration at [facility] for symptomatic relief if hematoma/seroma is confirmed on imaging.    3. Taste changes: This is likely multifactorial and could be related to her history of chemotherapy.  I encouraged her to use baking soda/salt water rinses to "cleanse the palate" before her meals to see if this may help with her taste changes.  She reports that her taste is slowly improving, which is encouraging, and this non-invasive intervention may help her taste as well.    4. Recent shingles:  There does not appear to be any active vesicles at this time; there are several very small areas that were affected that are now healing.  She has already completed the 3-week course of Valtrex, which is great. I encouraged her to let her PCP know if new lesions arise, but I think the virus is resolved now.  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 She is already on gabapentin for peripheral neuropathy, which should continue to provide her some relief of the herpetic neuralgia as well.    5. Bone health:  Given Ms. Rozman age and history of breast cancer, she is at risk for bone demineralization.  We do not have records of any DEXA scans.  Since she is not taking anti-estrogen therapy, I will defer any  future bone mineral density imaging to her  PCP or medical oncologist, as clinically indicated.  In the meantime, she was encouraged to increase her consumption of foods rich in calcium, as well as increase her weight-bearing activities.  She was given education on specific activities to promote bone health.  6. Cancer screening:  Due to Ms. Filion's history and her age, she should receive screening for skin cancers, colon cancer, and gynecologic cancers.  The information and recommendations are listed on the patient's comprehensive care plan/treatment summary and were reviewed in detail with the patient.    7. Health maintenance and wellness promotion: Ms. Fuhriman was encouraged to consume 5-7 servings of fruits and vegetables per day. We reviewed the "Nutrition Rainbow" handout, as well as the handout about "Nutrition for Breast Cancer Survivors."  She was also encouraged to engage in moderate to vigorous exercise for 30 minutes per day most days of the week. We discussed the [employer] fitness program, which is designed for cancer survivors to help them become more physically fit after cancer treatments.  She was instructed to limit her alcohol consumption and continue to abstain from tobacco use.    8. Support services/counseling: It is not uncommon for this period of the patient's cancer care trajectory to be one of many emotions and stressors.  We discussed an opportunity for her to participate in the next session of [facility] ("Finding Your New Normal") support group series designed for patients after they have completed treatment.   Ms. Hosea was encouraged to take advantage of our many other support services programs, support groups, and/or counseling in coping with her new life as a cancer survivor after completing anti-cancer treatment.  She was offered support today through active listening and expressive supportive counseling.  She was given information regarding our available services and  encouraged to contact me with any questions or for help enrolling in any of our support group/programs.    Dispo:   -Diagnostic mammogram and ultrasound of (R) breast today at breast center.  Likely hematoma vs seroma.  -Return to cancer center to see Dr. Jana Hakim in 03/2016. -She is welcome to return back to the Survivorship Clinic at any time; no additional follow-up needed at this time.  -Consider referral back to survivorship as a long-term survivor for continued surveillance  A total of 35 minutes of face-to-face time was spent with this patient with greater than 50% of that time in counseling and care-coordination.   Mike Craze, NP Survivorship Program [place] [phone]   Note: PRIMARY CARE PROVIDER Mayra Neer, [place] [phone]

## 2015-12-31 ENCOUNTER — Ambulatory Visit (HOSPITAL_BASED_OUTPATIENT_CLINIC_OR_DEPARTMENT_OTHER): Payer: BC Managed Care – PPO | Admitting: Adult Health

## 2015-12-31 ENCOUNTER — Other Ambulatory Visit: Payer: Self-pay | Admitting: Adult Health

## 2015-12-31 ENCOUNTER — Ambulatory Visit
Admission: RE | Admit: 2015-12-31 | Discharge: 2015-12-31 | Disposition: A | Discharge status: Home / Self Care | Payer: BC Managed Care – PPO | Source: Ambulatory Visit | Attending: Adult Health | Admitting: Adult Health

## 2015-12-31 ENCOUNTER — Other Ambulatory Visit: Payer: Self-pay | Admitting: *Deleted

## 2015-12-31 ENCOUNTER — Encounter: Payer: Self-pay | Admitting: Adult Health

## 2015-12-31 ENCOUNTER — Other Ambulatory Visit: Payer: BC Managed Care – PPO

## 2015-12-31 ENCOUNTER — Telehealth: Payer: Self-pay | Admitting: *Deleted

## 2015-12-31 VITALS — BP 109/83 | HR 94 | Temp 98.5°F | Resp 18 | Ht 63.0 in | Wt 127.3 lb

## 2015-12-31 DIAGNOSIS — C50211 Malignant neoplasm of upper-inner quadrant of right female breast: Secondary | ICD-10-CM

## 2015-12-31 DIAGNOSIS — Z853 Personal history of malignant neoplasm of breast: Secondary | ICD-10-CM | POA: Diagnosis not present

## 2015-12-31 DIAGNOSIS — N6310 Unspecified lump in the right breast, unspecified quadrant: Secondary | ICD-10-CM

## 2015-12-31 DIAGNOSIS — N63 Unspecified lump in breast: Secondary | ICD-10-CM

## 2015-12-31 NOTE — Telephone Encounter (Signed)
Pt was seen here today and mentioned that she never got results from lab test that were supposedly done on 6/13 at [place] at  [facility]. I called office to inquire abt this and was told the tests were never processed, although pt did come that day and had blood drawn. I called pt to give her this info and told her that office said since the orders are still active , she can come anytime and have the bloodwork redrawn for the tests. Pt said that she would go and have bloodwork redrawn. Message fwd to [employer].

## 2016-01-14 ENCOUNTER — Other Ambulatory Visit: Payer: Self-pay | Admitting: Physician Assistant

## 2016-01-14 ENCOUNTER — Ambulatory Visit
Admission: RE | Admit: 2016-01-14 | Discharge: 2016-01-14 | Disposition: A | Discharge status: Home / Self Care | Payer: BC Managed Care – PPO | Source: Ambulatory Visit | Attending: Physician Assistant | Admitting: Physician Assistant

## 2016-01-14 DIAGNOSIS — N6489 Other specified disorders of breast: Secondary | ICD-10-CM

## 2016-02-06 ENCOUNTER — Other Ambulatory Visit: Payer: Self-pay

## 2016-02-06 MED ORDER — MIRTAZAPINE 15 MG PO TABS: 15.0000 mg | ORAL_TABLET | Freq: Every day | ORAL | 0 refills | Status: DC

## 2016-02-11 ENCOUNTER — Encounter: Payer: Self-pay | Admitting: Oncology

## 2016-02-11 ENCOUNTER — Encounter: Payer: Self-pay | Admitting: Family Medicine

## 2016-04-05 ENCOUNTER — Other Ambulatory Visit: Payer: BC Managed Care – PPO

## 2016-04-12 ENCOUNTER — Ambulatory Visit (HOSPITAL_BASED_OUTPATIENT_CLINIC_OR_DEPARTMENT_OTHER): Payer: BC Managed Care – PPO | Admitting: Oncology

## 2016-04-12 ENCOUNTER — Other Ambulatory Visit (HOSPITAL_BASED_OUTPATIENT_CLINIC_OR_DEPARTMENT_OTHER): Payer: BC Managed Care – PPO

## 2016-04-12 ENCOUNTER — Ambulatory Visit (HOSPITAL_COMMUNITY)
Admission: RE | Admit: 2016-04-12 | Discharge: 2016-04-12 | Disposition: A | Discharge status: Home / Self Care | Payer: BC Managed Care – PPO | Source: Ambulatory Visit | Attending: Oncology | Admitting: Oncology

## 2016-04-12 ENCOUNTER — Encounter (HOSPITAL_COMMUNITY): Payer: Self-pay

## 2016-04-12 VITALS — BP 118/70 | HR 73 | Temp 98.1°F | Resp 18 | Ht 63.0 in | Wt 132.9 lb

## 2016-04-12 DIAGNOSIS — C50211 Malignant neoplasm of upper-inner quadrant of right female breast: Secondary | ICD-10-CM | POA: Insufficient documentation

## 2016-04-12 DIAGNOSIS — M549 Dorsalgia, unspecified: Secondary | ICD-10-CM

## 2016-04-12 DIAGNOSIS — Z853 Personal history of malignant neoplasm of breast: Secondary | ICD-10-CM | POA: Diagnosis not present

## 2016-04-12 DIAGNOSIS — Z170 Estrogen receptor positive status [ER+]: Principal | ICD-10-CM

## 2016-04-12 DIAGNOSIS — I2510 Atherosclerotic heart disease of native coronary artery without angina pectoris: Secondary | ICD-10-CM | POA: Diagnosis not present

## 2016-04-12 DIAGNOSIS — F329 Major depressive disorder, single episode, unspecified: Secondary | ICD-10-CM

## 2016-04-12 LAB — COMPREHENSIVE METABOLIC PANEL
ALBUMIN: 3.7 g/dL (ref 3.5–5.0)
ALT: 17 U/L (ref 0–55)
AST: 23 U/L (ref 5–34)
Alkaline Phosphatase: 80 U/L (ref 40–150)
Anion Gap: 10 mEq/L (ref 3–11)
BUN: 9.2 mg/dL (ref 7.0–26.0)
CALCIUM: 9.7 mg/dL (ref 8.4–10.4)
CHLORIDE: 105 meq/L (ref 98–109)
CO2: 27 mEq/L (ref 22–29)
CREATININE: 0.8 mg/dL (ref 0.6–1.1)
EGFR: 82 mL/min/{1.73_m2} — ABNORMAL LOW (ref >90)
GLUCOSE: 100 mg/dL (ref 70–140)
POTASSIUM: 3.9 meq/L (ref 3.5–5.1)
SODIUM: 143 meq/L (ref 136–145)
Total Bilirubin: 0.44 mg/dL (ref 0.20–1.20)
Total Protein: 7.3 g/dL (ref 6.4–8.3)

## 2016-04-12 LAB — CBC WITH DIFFERENTIAL/PLATELET
BASO%: 1 % (ref 0.0–2.0)
Basophils Absolute: 0 10*3/uL (ref 0.0–0.1)
EOS ABS: 0.1 10*3/uL (ref 0.0–0.5)
EOS%: 2.4 % (ref 0.0–7.0)
HEMATOCRIT: 38.7 % (ref 34.8–46.6)
HGB: 13.1 g/dL (ref 11.6–15.9)
LYMPH#: 1.4 10*3/uL (ref 0.9–3.3)
LYMPH%: 31.8 % (ref 14.0–49.7)
MCH: 34.7 pg — ABNORMAL HIGH (ref 25.1–34.0)
MCHC: 33.8 g/dL (ref 31.5–36.0)
MCV: 102.5 fL — AB (ref 79.5–101.0)
MONO#: 0.4 10*3/uL (ref 0.1–0.9)
MONO%: 8.4 % (ref 0.0–14.0)
NEUT#: 2.5 10*3/uL (ref 1.5–6.5)
NEUT%: 56.4 % (ref 38.4–76.8)
PLATELETS: 245 10*3/uL (ref 145–400)
RBC: 3.77 10*6/uL (ref 3.70–5.45)
RDW: 12.9 % (ref 11.2–14.5)
WBC: 4.5 10*3/uL (ref 3.9–10.3)

## 2016-04-12 MED ORDER — IOPAMIDOL (ISOVUE-300) INJECTION 61%
75.0000 mL | Freq: Once | INTRAVENOUS | Status: AC | PRN
  Administered 2016-04-12: 75 mL via INTRAVENOUS

## 2016-04-12 NOTE — Progress Notes (Signed)
[name]  Telephone:([phone]) [phone] [phone]) [phone]     ID: Beth Rice DOB: 09-28-1955  MR#: [id number]  CBJ#:[id number]  Patient Care Team: Mayra Neer, MD as PCP - General (Family Medicine) [name], MD as Consulting Physician (General Surgery) Chauncey Cruel, MD as Consulting Physician (Oncology) Eppie Gibson, MD as Attending Physician (Radiation Oncology) Rockwell Germany, RN as Registered Nurse Mauro Kaufmann, RN as Registered Nurse PCP: Mayra Neer, MD OTHER MD: Jamie Kato MD  CHIEF COMPLAINT: HER-2 positive breast cancer  CURRENT TREATMENT: Observation  BREAST CANCER HISTORY: From the original intake note:  Beth Rice herself palpated a mass in her right breast but "didn't think much about it". She saw [name] for routine gynecologic follow-up and he also palpated and immediately set her up for right diagnostic mammography with tomosynthesis and ultrasonography at the [facility] Center 08/27/2014. In the upper outer quadrant of the right breast there was a spiculated mass accompanied by calcifications, the complex extending up to 2.5 cm. This was palpable and mobile. It measured approximately 2 cm by palpation. There was no palpable right axillary adenopathy. Ultrasound of the right axilla also showed normal axillary contents.  Ultrasound of the right breast confirmed an irregularly marginated hypoechoic mass measuring 1.8 cm. Biopsy was not immediately performed because of patient was on aspirin at the time. On 08/30/2014 [name] underwent biopsy of the mass in question, with the pathology (SAA [id number]) (showing an invasive ductal carcinoma, grade 2, estrogen receptor 2% positive with moderate staining intensity, progesterone receptor negative, with an MIB-1 of 81% and with HER-2 amplified, the signals ratio being 2.87 and the number per cell 4.30.  On 09/03/2024 she underwent bilateral breast MRI. This found the breast composition to be  category C. In the right breast there was a spiculated mass measuring 2.3 cm in close proximity to the pectoralis but without obvious invasion of the muscle. The rest of the breast, the left breast, and the lymph node areas well otherwise unremarkable.  Her subsequent history is as detailed below   INTERVAL HISTORY: Amri returns today for follow up of her HER-2 positive breast cancer accompanied by her sister. Beth Rice completed her HER-2 immunotherapy in April. She maintained an excellent ejection fractions throughout. We discussed anti-estrogens and she decided against them given the fact that her tumor was very minimally estrogen receptor positive  She is now living at [name] and hopes to establish yourself with an oncologist in the [place] area  [name]: A detailed review of systems today was entirely benign  PAST MEDICAL HISTORY: Past Medical History:  Diagnosis Date  . Anemia 12/2014   due to chemotherapy -received blood transfusion  . Anxiety   . Arthritis    "neck" (06/04/2014)  . Cancer ([place]) 08/2014   Rt breast  . Chest pain    a. 09/2008 Cath: ER 60%, LM nl, LAD 57m 479m, LCX nondom, nl, RCA dom, 2019mDA/PLA nl.  . Chronic lower back pain   . Depression   . Elevated cholesterol   . Gastroesophageal reflux disease    H/O  . H/O degenerative disc disease   . Headache    "frequency depends on the weather" (06/04/2014)  . Hot flashes   . Myocardial infarction 2010   "I think I had a heart attack in 2010" cath showed small blockage- no treatment  . Tobacco abuse     PAST SURGICAL HISTORY: Past Surgical History:  Procedure Laterality Date  . BREAST LUMPECTOMY  WITH RADIOACTIVE SEED AND SENTINEL LYMPH NODE BIOPSY Right 01/21/2015   Procedure: BREAST LUMPECTOMY WITH RADIOACTIVE SEED AND SENTINEL LYMPH NODE BIOPSY;  Surgeon: [name], MD;  Location: [facility];  Service: General;  Laterality: Right;  . CARDIAC CATHETERIZATION  10/15/2008    noncritical mid & distal third anterior descending disease  . OVARIAN CYST REMOVAL  1980's?  Marland Kitchen PORT-A-CATH REMOVAL N/A 09/16/2015   Procedure: REMOVAL PORT-A-CATH;  Surgeon: [name], MD;  Location: [facility];  Service: General;  Laterality: N/A;  . PORTACATH PLACEMENT Left 09/17/2014   Procedure: INSERTION PORT-A-CATH;  Surgeon: [name], MD;  Location: [facility];  Service: General;  Laterality: Left;  . TONSILLECTOMY      FAMILY HISTORY Family History  Problem Relation Age of Onset  . Hypertension Sister   . Heart disease Mother   . Alzheimer's disease Mother   . Cerebral aneurysm Father   . Mental illness Father   . Ovarian cancer Maternal Aunt   The patient's father died at the age of 51 after having cerebral hemorrhages and age 58 and 91. The patient's mother died at the age of 78 with Parkinson's and Alzheimer's disease. The patient's mother had one sister and this sister was diagnosed with ovarian cancer in her early 74s. There is no other history of breast or ovarian cancer in the family.   GYNECOLOGIC HISTORY:  No LMP recorded. Patient is postmenopausal. menarche age 60, the patient is GX P0. She stopped having periods in 2005. She did not take hormone replacement.   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 She is in process of retiring, with targeted retirement date in June. She is single, lives alone with 4 dogs.    ADVANCED DIRECTIVES: Not in place. She tells me she plans to name her sister [name] as her healthcare power of attorney. [name] (who is a Marine scientist) can be reached in [place] at [phone] (cell) or [phone] (home).   HEALTH MAINTENANCE: Social History  Substance Use Topics  . Smoking status: Former Smoker    Packs/day: 1.00    Years: 20.00    Types: Cigarettes    Quit date: 06/21/2014  . Smokeless tobacco: Never Used  . Alcohol use 0.0 oz/week      Comment: Rare     Colonoscopy: 2006/[facility]  FXT:KWIOX 2016   B[facility] density:09/10/[facility]/normal   Lipid panel:  Allergies  Allergen Reactions  . Statins Other (See Comments)    Patient has significant muscle cramps with statins. She cannot tolerate them.  . Penicillins Hives  . Sulfa Antibiotics Hives    Current Outpatient Prescriptions  Medication Sig Dispense Refill  . acyclovir (ZOVIRAX) 400 MG tablet Take 1 tablet (400 mg total) by mouth 2 (two) times daily. 60 tablet 3  . ALPRAZolam (XANAX) 0.5 MG tablet Take 1 tablet every morning 30 tablet 2  . aspirin EC 81 MG tablet Take 81 mg by mouth daily.    . Calcium Carbonate-Vitamin D (CALCIUM + D PO) Take 1 tablet by mouth at bedtime.     . clonazePAM (KLONOPIN) 1 MG tablet Take 1 tablet (1 mg total) by mouth at bedtime. 90 tablet 1  . fluticasone (FLONASE) 50 MCG/ACT nasal spray Place 2 sprays into both nostrils daily. 16 g 11  . gabapentin (NEURONTIN) 100 MG capsule Take 1 capsule (100 mg total) by mouth 3 (three) times daily. Take 1-3  capsules up to three times daily (Patient taking differently: Take 100 mg by mouth daily. ) 60 capsule 2  . loratadine (CLARITIN) 10 MG tablet Take 10 mg by mouth daily.    Marland Kitchen Lysine 500 MG CAPS Take 500 mg by mouth at bedtime.     . mirtazapine (REMERON) 15 MG tablet Take 1 tablet (15 mg total) by mouth at bedtime. 90 tablet 0  . Multiple Vitamins-Minerals (MULTIVITAMIN WITH MINERALS) tablet Take 1 tablet by mouth at bedtime.     Marland Kitchen omeprazole (PRILOSEC) 40 MG capsule Take 1 capsule (40 mg total) by mouth daily. 30 capsule 2  . sertraline (ZOLOFT) 50 MG tablet Take 1 tablet (50 mg total) by mouth daily. 90 tablet 1   No current facility-administered medications for this visit.     OBJECTIVE: middle-aged white woman In no acute distress Vitals:   04/12/16 1421  BP: 118/70  Pulse: 73  Resp: 18  Temp: 98.1 F (36.7 C)     Body mass index is 23.54 kg/m.    ECOG  FS:0 - Asymptomatic  Sclerae unicteric, EOMs intact Oropharynx clear and moist No cervical or supraclavicular adenopathy Lungs no rales or rhonchi Heart regular rate and rhythm Abd soft, nontender, positive bowel sounds MSK no focal spinal tenderness, no upper extremity lymphedema Neuro: nonfocal, well oriented, appropriate affect Breasts: The right breast received radiation after lumpectomy. There is no evidence of local recurrence. There is the expected induration associated with the scar incisions. The right axilla is benign. Left breast is benign.  LAB RESULTS:  CMP     Component Value Date/Time   NA 143 04/12/2016 1131   K 3.9 04/12/2016 1131   CL 102 09/08/2015 0956   CO2 27 04/12/2016 1131   GLUCOSE 100 04/12/2016 1131   BUN 9.2 04/12/2016 1131   CREATININE 0.8 04/12/2016 1131   CALCIUM 9.7 04/12/2016 1131   PROT 7.3 04/12/2016 1131   ALBUMIN 3.7 04/12/2016 1131   AST 23 04/12/2016 1131   ALT 17 04/12/2016 1131   ALKPHOS 80 04/12/2016 1131   BILITOT 0.44 04/12/2016 1131   GFRNONAA >60 09/08/2015 0956   GFRNONAA >89 10/23/2013 1030   GFRAA >60 09/08/2015 0956   GFRAA >89 10/23/2013 1030    INo results found for: SPEP, UPEP  Lab Results  Component Value Date   WBC 4.5 04/12/2016   NEUTROABS 2.5 04/12/2016   HGB 13.1 04/12/2016   HCT 38.7 04/12/2016   MCV 102.5 (H) 04/12/2016   PLT 245 04/12/2016      Chemistry      Component Value Date/Time   NA 143 04/12/2016 1131   K 3.9 04/12/2016 1131   CL 102 09/08/2015 0956   CO2 27 04/12/2016 1131   BUN 9.2 04/12/2016 1131   CREATININE 0.8 04/12/2016 1131      Component Value Date/Time   CALCIUM 9.7 04/12/2016 1131   ALKPHOS 80 04/12/2016 1131   AST 23 04/12/2016 1131   ALT 17 04/12/2016 1131   BILITOT 0.44 04/12/2016 1131       No results found for: LABCA2  No components found for: LABCA125  No results for input(s): INR in the last 168 hours.  Urinalysis    Component Value Date/Time    COLORURINE YELLOW 12/23/2014 1335   APPEARANCEUR CLEAR 12/23/2014 1335   LABSPEC 1.007 12/23/2014 1335   PHURINE 6.0 12/23/2014 1335   GLUCOSEU NEGATIVE 12/23/2014 1335   HGBUR NEGATIVE 12/23/2014 1335   BILIRUBINUR NEGATIVE 12/23/2014 1335  BILIRUBINUR neg 06/18/2014 1103   KETONESUR NEGATIVE 12/23/2014 1335   PROTEINUR NEGATIVE 12/23/2014 1335   UROBILINOGEN 0.2 12/23/2014 1335   NITRITE NEGATIVE 12/23/2014 1335   LEUKOCYTESUR NEGATIVE 12/23/2014 1335    STUDIES: CLINICAL DATA: Status post right lumpectomy for breast carcinoma in April of last year. No current complaints.  EXAM: 2D DIGITAL DIAGNOSTIC BILATERAL MAMMOGRAM WITH CAD AND ADJUNCT TOMO  COMPARISON: Previous exam(s).  ACR Breast Density Category c: The breast tissue is heterogeneously dense, which may obscure small masses.  FINDINGS: There is a seroma in the lumpectomy site associated architectural distortion and surgical vascular clips. There is no evidence of residual or recurrent breast carcinoma.  There are no discrete masses or other areas of architectural distortion. There are no suspicious calcifications.  Mammographic images were processed with CAD.  IMPRESSION: No evidence of recurrent or new breast malignancy. Benign postsurgical changes on the right.  RECOMMENDATION: Diagnostic mammography in 1 year per standard post lumpectomy protocol.  I have discussed the findings and recommendations with the patient. Results were also provided in writing at the conclusion of the visit. If applicable, a reminder letter will be sent to the patient regarding the next appointment.  BI-RADS CATEGORY 2: Benign.   Electronically Signed  By: Lajean Manes M.D.  On: 10/06/2015 12:35  CLINICAL DATA: Breast cancer. Radiation therapy. Persisting cough. Tobacco use.  EXAM: CT CHEST WITHOUT CONTRAST  TECHNIQUE: Multidetector CT imaging of the chest was performed following the standard  protocol without IV contrast.  COMPARISON: 09/17/2014  FINDINGS: Mediastinum/Nodes: Mass or lumpectomy site in the right medial breast with marginal fiducials.  Scattered small mediastinal lymph nodes are present, but do not appear pathologic. Left anterior descending coronary artery atherosclerotic calcification. Prior right axillary dissection.  Lungs/Pleura: Peripheral interstitial accentuation anteriorly in the right upper lobe and right middle lobe likely due to prior radiation port.  Right lower lobe pulmonary nodule on image 80 series 5 measures 0.6 by 0.6 cm. Adjacent 0.4 by 0.2 cm nodule on that same image.  0.3 by 0.2 cm peribronchovascular nodule peripherally in the left lower lobe on image 104/5. 4 mm subpleural nodule in the left lower lobe on image 96/5.  Mild emphysema.  Upper abdomen: Hypodense lesion in segment 7 of the liver, 5 mm diameter on image 85/2.  Musculoskeletal: Unremarkable  IMPRESSION: 1. 6 mm right lower lobe pulmonary nodule with adjacent 4 by 2 mm pulmonary nodule. Several tiny left lower lobe pulmonary nodules. Given the history of breast cancer, standard guidelines for follow up do not apply. Consider surveillance imaging in 3-6 months time. 2. Left anterior descending coronary artery atherosclerosis. 3. Postoperative findings in the medial right breast with fiducials around what appears to be a lumpectomy site. 4. Tiny 5 mm hypodense lesion in the dome of segment 7 of the liver, most likely a cyst or hemangioma but technically nonspecific. This can additionally be surveilled on follow up CT chest. 5. Mild emphysema.   Electronically Signed  By: Van Clines M.D.  On: 10/06/2015 16:25 Ct Chest W Contrast  Result Date: 04/12/2016 CLINICAL DATA:  Right breast cancer, chemotherapy and radiation therapy complete. EXAM: CT CHEST WITH CONTRAST TECHNIQUE: Multidetector CT imaging of the chest was performed during  intravenous contrast administration. CONTRAST:  42m ISOVUE-300 IOPAMIDOL (ISOVUE-300) INJECTION 61% COMPARISON:  10/06/2015. FINDINGS: Cardiovascular: Coronary artery calcification. Heart size normal. No pericardial effusion. Mediastinum/Nodes: Mediastinal and hilar lymph nodes are not enlarged by CT size criteria. No internal mammary or axillary adenopathy. Surgical clips  in the right axilla. Esophagus is grossly unremarkable. Lungs/Pleura: Mild subpleural radiation fibrosis in the anterior right hemi thorax. A few scattered pulmonary nodules measure 6 mm or less in size, as before. No pleural fluid. Airway is unremarkable. Upper Abdomen: Sub cm low-attenuation lesion in the posterior right hepatic lobe is unchanged and too small to characterize. Visualized portions of the liver, adrenal glands, kidneys, spleen, pancreas, stomach and bowel are grossly unremarkable. Musculoskeletal: No worrisome lytic or sclerotic lesions. Fluid collection in the anterior right chest wall is seen in association with surgical clips and measures 2.5 x 3.3 cm, similar. IMPRESSION: 1. No evidence of metastatic disease. 2. Post lumpectomy fluid collection in the right breast. 3. Coronary artery calcification. Electronically Signed   By: Lorin Picket M.D.   On: 04/12/2016 15:21     ASSESSMENT: 60 y.o. [place] woman status post right breast upper inner qudrant biopsy 08/30/2014 for a clinical T2 N0, stage IIA invasive ductal carcinoma, grade 2, estrogen receptor 2% "positive," progesterone receptor negative, HER-2 positive, with an MIB-1 of 81%.  (1) neoadjuvant chemotherapy consisting of carboplatin, docetaxel, trastuzumab and pertuzumab x 6  beginning 09/23/14, completed 01/06/2015   (a) docetaxel switched to gemcitabine for cycles 5 and 6 because of acute bilateral lower extremity edema  (2) trastuzumab will be continued to complete 1 year (through April 2017)  (a) most recent echo 08/13/2015 shows an EF of 55%  (stable)  (3) status post right lumpectomy and sentinel lymph node sampling 01/21/2015 showing a complete pathologic response  (4) adjuvant radiation 02/27/2015-04/09/2015:  Right Breast. 50 Gy in 25 fractions. Right Breast Boost. 10 Gy in 5 fractions  (5) considered anti-estrogens but opted against them (for either treatment or prophylaxis)  (6) genetics testing 10/10/2014 through the [employer] gene panel offered by [facility] found no deleterious mutations in ATM, BARD1, BRCA1, BRCA2, BRIP1, CDH1, CHEK2, EPCAM, MLH1, MRE11A, MSH2, MSH6, MUTYH, NBN, NF1, PALB2, PMS2, PTEN, RAD50, RAD51C, RAD51D, SMARCA4, STK11, and TP53.   PLAN: Luiza is now a little over a year out from definitive surgery for her breast cancer with no evidence of disease recurrence. This is very favorable.  We reviewed the fact that she had a complete pathologic response. This indicates a very good long-term prognosis.  We again discussed anti-estrogens. It is possible she would obtain a minimal or very marginal reduction in the risk of recurrence if she took anti-estrogens for 5 years, but this is not motivating to her given the possible side effects toxicities and complications of these agents. Accordingly we are continuing with observation alone  She now lives on the [place]. We are going to try to get her an appointment with one of the gynecologists in [place]. Most likely she would be seen some time in the first part of 2018 to continue her routine follow-up  I will be glad to see Shantea at any time in the future if on when the need arises but as of now we are making no further routine appointment for her here.   Chauncey Cruel, MD   04/13/2016 10:20 AM

## 2016-04-13 ENCOUNTER — Other Ambulatory Visit: Payer: Self-pay | Admitting: Oncology

## 2016-04-16 ENCOUNTER — Other Ambulatory Visit: Payer: Self-pay | Admitting: *Deleted

## 2016-04-16 DIAGNOSIS — Z170 Estrogen receptor positive status [ER+]: Principal | ICD-10-CM

## 2016-04-16 DIAGNOSIS — C50211 Malignant neoplasm of upper-inner quadrant of right female breast: Secondary | ICD-10-CM

## 2016-04-17 ENCOUNTER — Telehealth: Payer: Self-pay | Admitting: Oncology

## 2016-04-17 NOTE — Telephone Encounter (Signed)
INBASKET MESSAGE TO HIM RE TRANSFER OF CARE

## 2016-04-26 ENCOUNTER — Telehealth: Payer: Self-pay | Admitting: Oncology

## 2016-04-26 NOTE — Telephone Encounter (Signed)
FAXED RECORDS TO DR BRIGIT ARB IN [facility] RELEASE ID [id number]

## 2016-04-30 ENCOUNTER — Other Ambulatory Visit: Payer: Self-pay | Admitting: Family Medicine

## 2016-04-30 DIAGNOSIS — F32A Depression, unspecified: Secondary | ICD-10-CM

## 2016-04-30 DIAGNOSIS — F329 Major depressive disorder, single episode, unspecified: Secondary | ICD-10-CM

## 2016-05-02 NOTE — Telephone Encounter (Signed)
10/2015 last ov 03/2016 last labs

## 2016-05-04 ENCOUNTER — Telehealth: Payer: Self-pay

## 2016-05-04 NOTE — Telephone Encounter (Signed)
Pt needs refills on klonopin 1 mg, mirtazapine 15 mg and zoloft.   [facility]eds to [facility] [place].  Her number is [phone].

## 2016-05-06 ENCOUNTER — Other Ambulatory Visit: Payer: Self-pay | Admitting: Family Medicine

## 2016-05-06 MED ORDER — MIRTAZAPINE 15 MG PO TABS: 15.0000 mg | ORAL_TABLET | Freq: Every day | ORAL | 0 refills | Status: AC

## 2016-05-06 MED ORDER — CLONAZEPAM 1 MG PO TABS: 1.0000 mg | ORAL_TABLET | Freq: Every day | ORAL | 1 refills | Status: AC

## 2016-05-06 MED ORDER — SERTRALINE HCL 50 MG PO TABS: 50.0000 mg | ORAL_TABLET | Freq: Every day | ORAL | 0 refills | Status: AC

## 2016-05-06 NOTE — Telephone Encounter (Signed)
Pt called today - states she has moved to [place], can she have 30 day supply till she can establish with PCP in [place]?

## 2016-05-06 NOTE — Telephone Encounter (Signed)
3 mos of mirtazapine and zoloft [facility]. 2 mo of klonopin can be called or faxed in.  This should give pt plenty of time to establish elsewhere and get her records sent there.  Does she know where she wants her records sent so we can get started on this for her?

## 2016-05-06 NOTE — Telephone Encounter (Signed)
Duplicate. Ph message already sent to [facility].

## 2016-05-06 NOTE — Telephone Encounter (Signed)
10/2015 last ov and refills for 6 months

## 2016-05-07 NOTE — Telephone Encounter (Signed)
Called in clonazepam Rx to pharm. Spoke to pt to notify and transferred her to [facility] to discuss records.

## 2016-05-08 ENCOUNTER — Other Ambulatory Visit: Payer: Self-pay | Admitting: Radiology

## 2016-08-03 ENCOUNTER — Other Ambulatory Visit: Payer: Self-pay | Admitting: Family Medicine

## 2017-01-15 ENCOUNTER — Other Ambulatory Visit: Payer: Self-pay | Admitting: Family Medicine

## 2017-01-15 DIAGNOSIS — J309 Allergic rhinitis, unspecified: Secondary | ICD-10-CM

## 2017-03-31 IMAGING — MR MR BREAST BILATERAL W WO CONTRAST
8 of 13 series · 31 of 48 positions shown · IV contrast (11 ml Multihance)
Comparison: Previous exam(s).

CLINICAL DATA: 59-year-old female with recently diagnosed invasive
ductal carcinoma of the right breast following ultrasound-guided
biopsy of a palpable mass at the [DATE] position, with the mass and
calcifications measuring 2.5 cm mammographically.

LABS:  Not applicable.
EXAM:
BILATERAL BREAST MRI WITH AND WITHOUT CONTRAST
TECHNIQUE: Multiplanar, multisequence MR images of both breasts were obtained
prior to and following the intravenous administration of 11 ml of
MultiHance.

[Series 2: T2 · axial · 3.0mm · 0.94mm/px · z∈[-80,+82]mm · 3 of 55 slices shown]
[im 1/55]
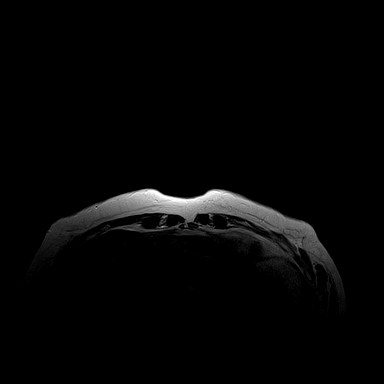
[im 28/55]
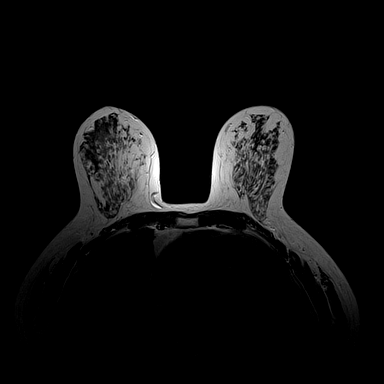
[im 55/55]
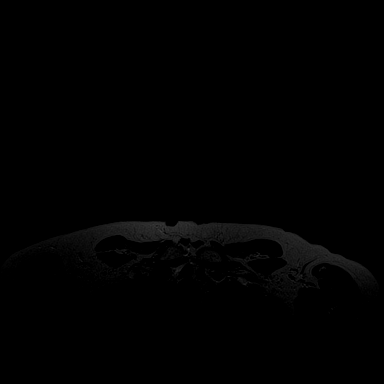

[Series 3: t2_tirm_tra ipat (a-p) · axial · 3.0mm · 0.70mm/px · z∈[-80,+82]mm · 2 of 55 slices shown]
[im 1/55]
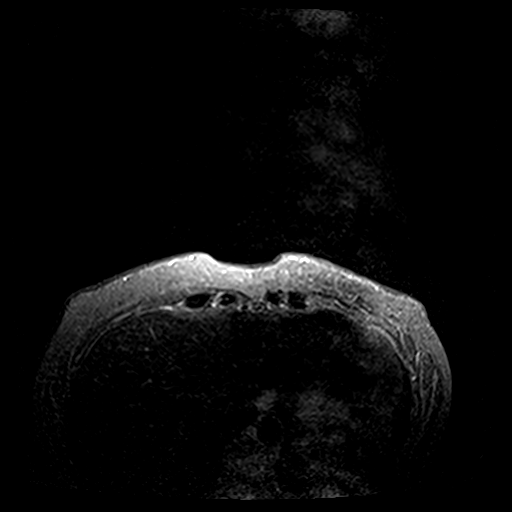
[im 55/55]
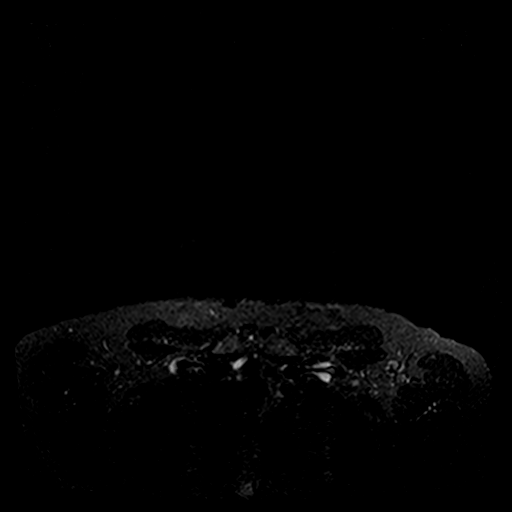

[Series 4: fl3d pre-cm no · axial · non-contrast · 1.2mm · 0.94mm/px · z∈[-85,+86]mm · 5 of 144 slices shown]
[im 1/144]
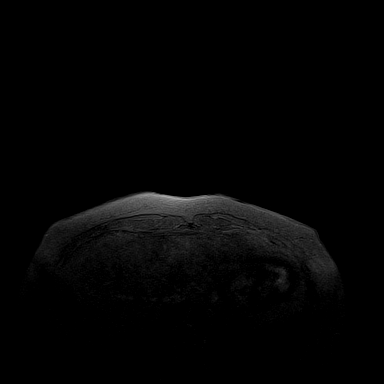
[im 36/144]
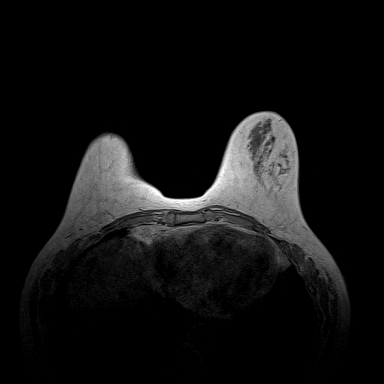
[im 72/144]
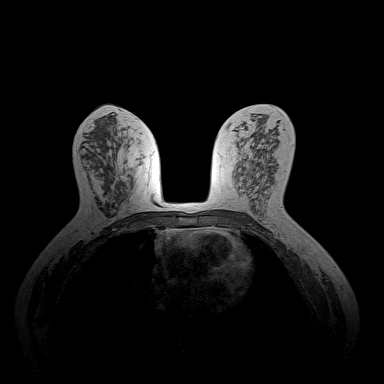
[im 108/144]
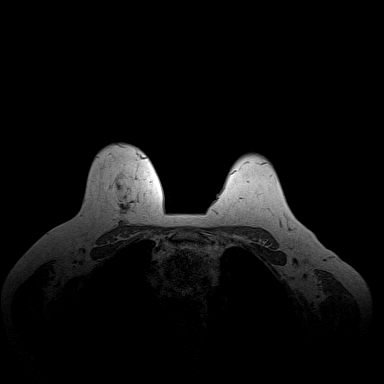
[im 144/144]
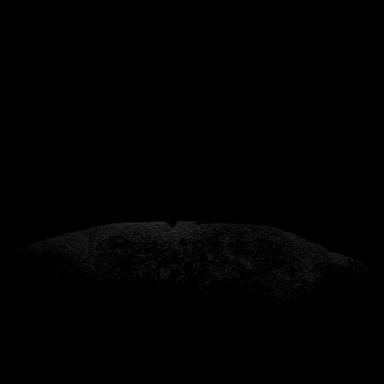

[Series 5: fl3d pre-cm · axial · non-contrast · 1.2mm · 0.94mm/px · z∈[-85,+86]mm · 5 of 144 slices shown]
[im 1/144]
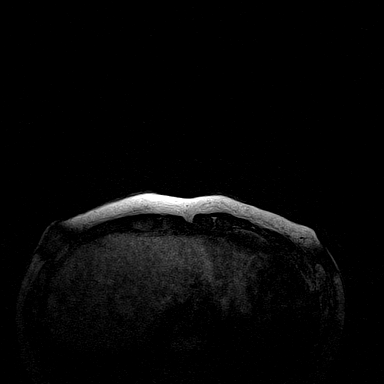
[im 36/144]
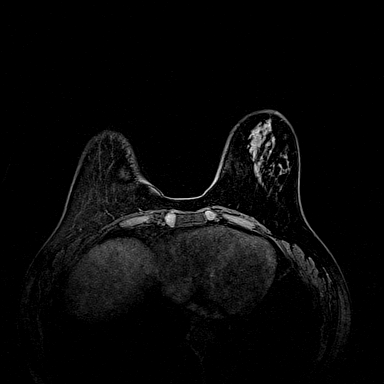
[im 72/144]
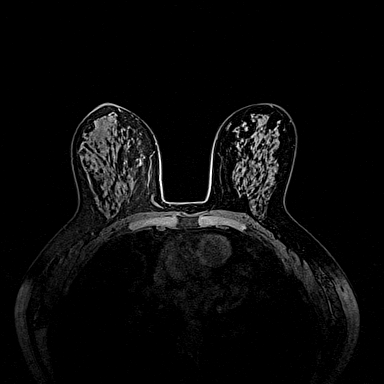
[im 108/144]
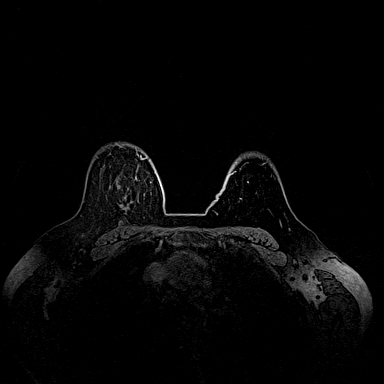
[im 144/144]
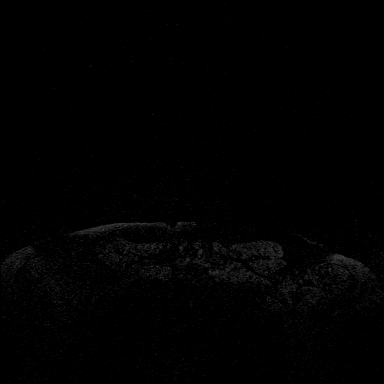

[Series 6: fl3d post-cm 20 · axial · 1.2mm · 0.94mm/px · z∈[-85,+86]mm · 5 of 144 slices shown (1 of 3)]
[im 1/144]
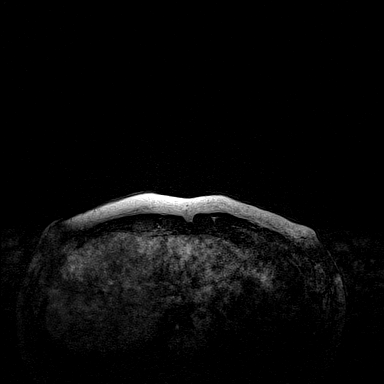
[im 36/144]
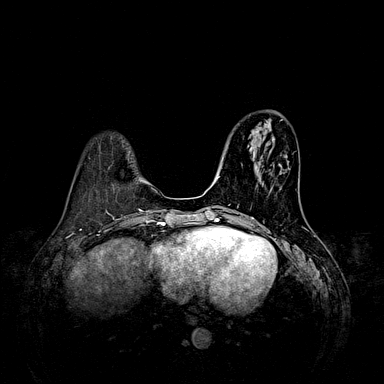
[im 72/144]
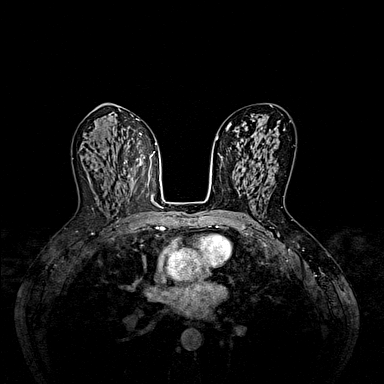
[im 108/144]
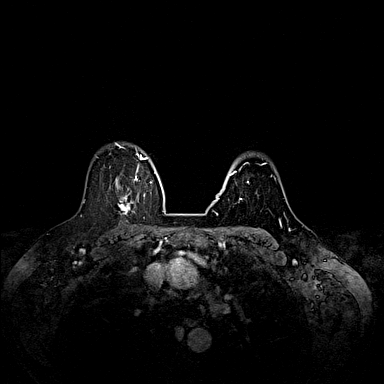
[im 144/144]
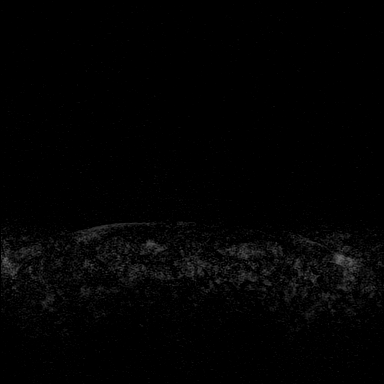

[Series 7: fl3d post-cm 20 · axial · 1.2mm · 0.94mm/px · z∈[-85,+86]mm · 5 of 144 slices shown (2 of 3)]
[im 1/144]
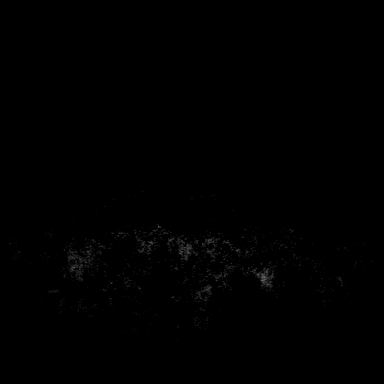
[im 36/144]
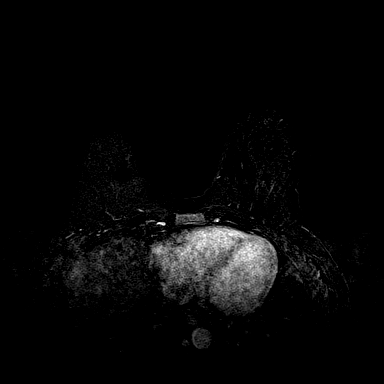
[im 72/144]
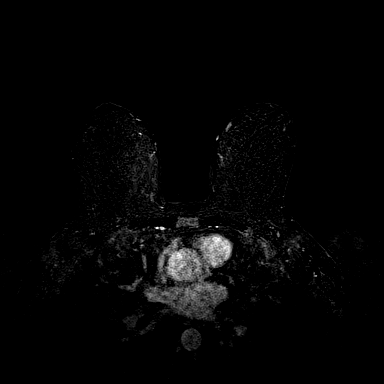
[im 108/144]
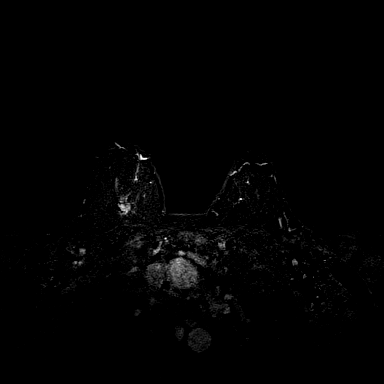
[im 144/144]
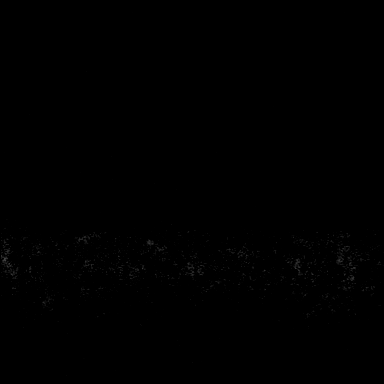

[Series 8: fl3d post-cm 20 · axial · 172.8mm · 0.94mm/px · 1 of 1 slices shown (3 of 3)]
[im 1/1]
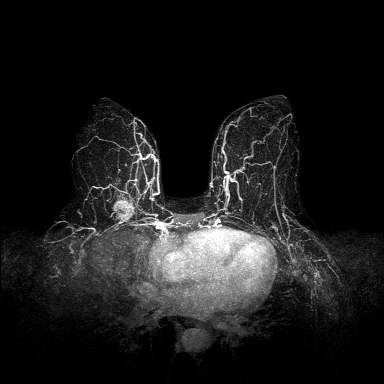

[Series 9: fl3d post-cm 3min · axial · 1.2mm · 0.94mm/px · z∈[-85,+86]mm · 5 of 144 slices shown]
[im 1/144]
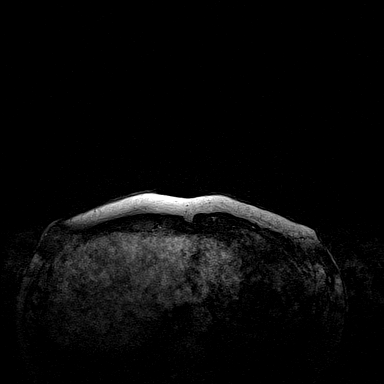
[im 36/144]
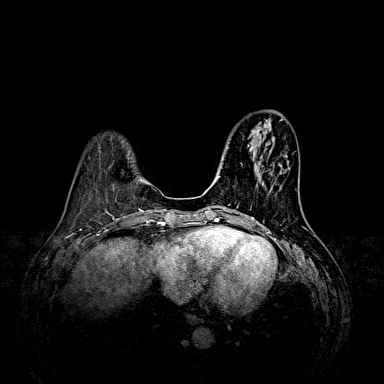
[im 72/144]
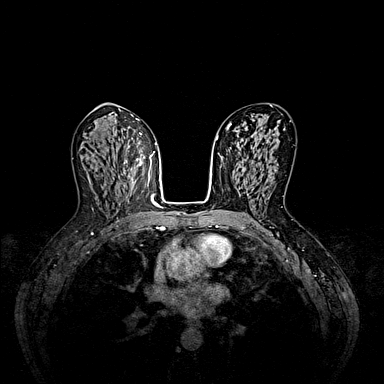
[im 108/144]
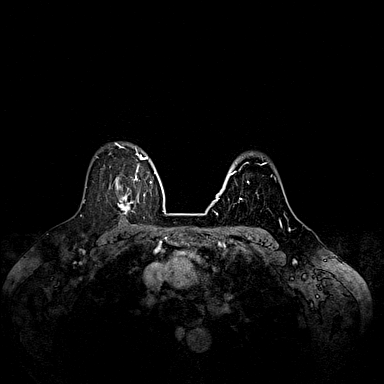
[im 144/144]
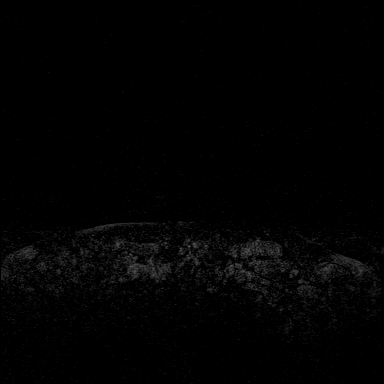

[31 of 48 positions shown; findings below may reference images not displayed]

THREE-DIMENSIONAL MR IMAGE RENDERING ON INDEPENDENT WORKSTATION:

Three-dimensional MR images were rendered by post-processing of the
original MR data on an independent workstation. The
three-dimensional MR images were interpreted, and findings are
reported in the following complete MRI report for this study. Three
dimensional images were evaluated at the independent [employer]
workstation
FINDINGS: Breast composition: c.  Heterogeneous fibroglandular tissue.

Background parenchymal enhancement: Moderate.

Right breast: Spiculated mass containing biopsy clip artifact in the
superior right breast posterior depth at the approximate [DATE]
position measures 2.3 cm AP, 2.3 cm transverse, and 2.1 cm
craniocaudal. The mass comes into close proximity with the adjacent
pectoralis muscle, however there are no findings to suggest muscular
invasion. No additional suspicious areas of enhancement or masses
seen in the right breast to suggest additional sites of disease.

Left breast: No suspicious rapidly enhancing masses or abnormal
areas of enhancement seen in the left breast to suggest malignancy.

Lymph nodes: No morphologically abnormal axillary lymph nodes. No
internal mammary lymphadenopathy.

Ancillary findings:  None.
IMPRESSION: 1. Biopsy proven malignancy in the superior posterior right breast
at the approximate [DATE] position measures up to 2.3 cm, and
corresponds to the mass and associated calcifications seen
mammographically spanning approximately 2.5 cm. No findings to
suggest additional sites of disease in the right breast.

2.  No MRI evidence of malignancy in the left breast.

RECOMMENDATION:
Treatment plan for known right breast cancer.

BI-RADS CATEGORY  6: Known biopsy-proven malignancy.

## 2017-04-06 NOTE — Telephone Encounter (Signed)
done

## 2017-04-13 IMAGING — CR DG CHEST 1V PORT
1 series · 1 of 1 positions shown · non-contrast
Comparison: Portable chest x-ray of 06/05/2014

CLINICAL DATA: Port-A-Cath placement

EXAM:
PORTABLE CHEST - 1 VIEW

[AP]
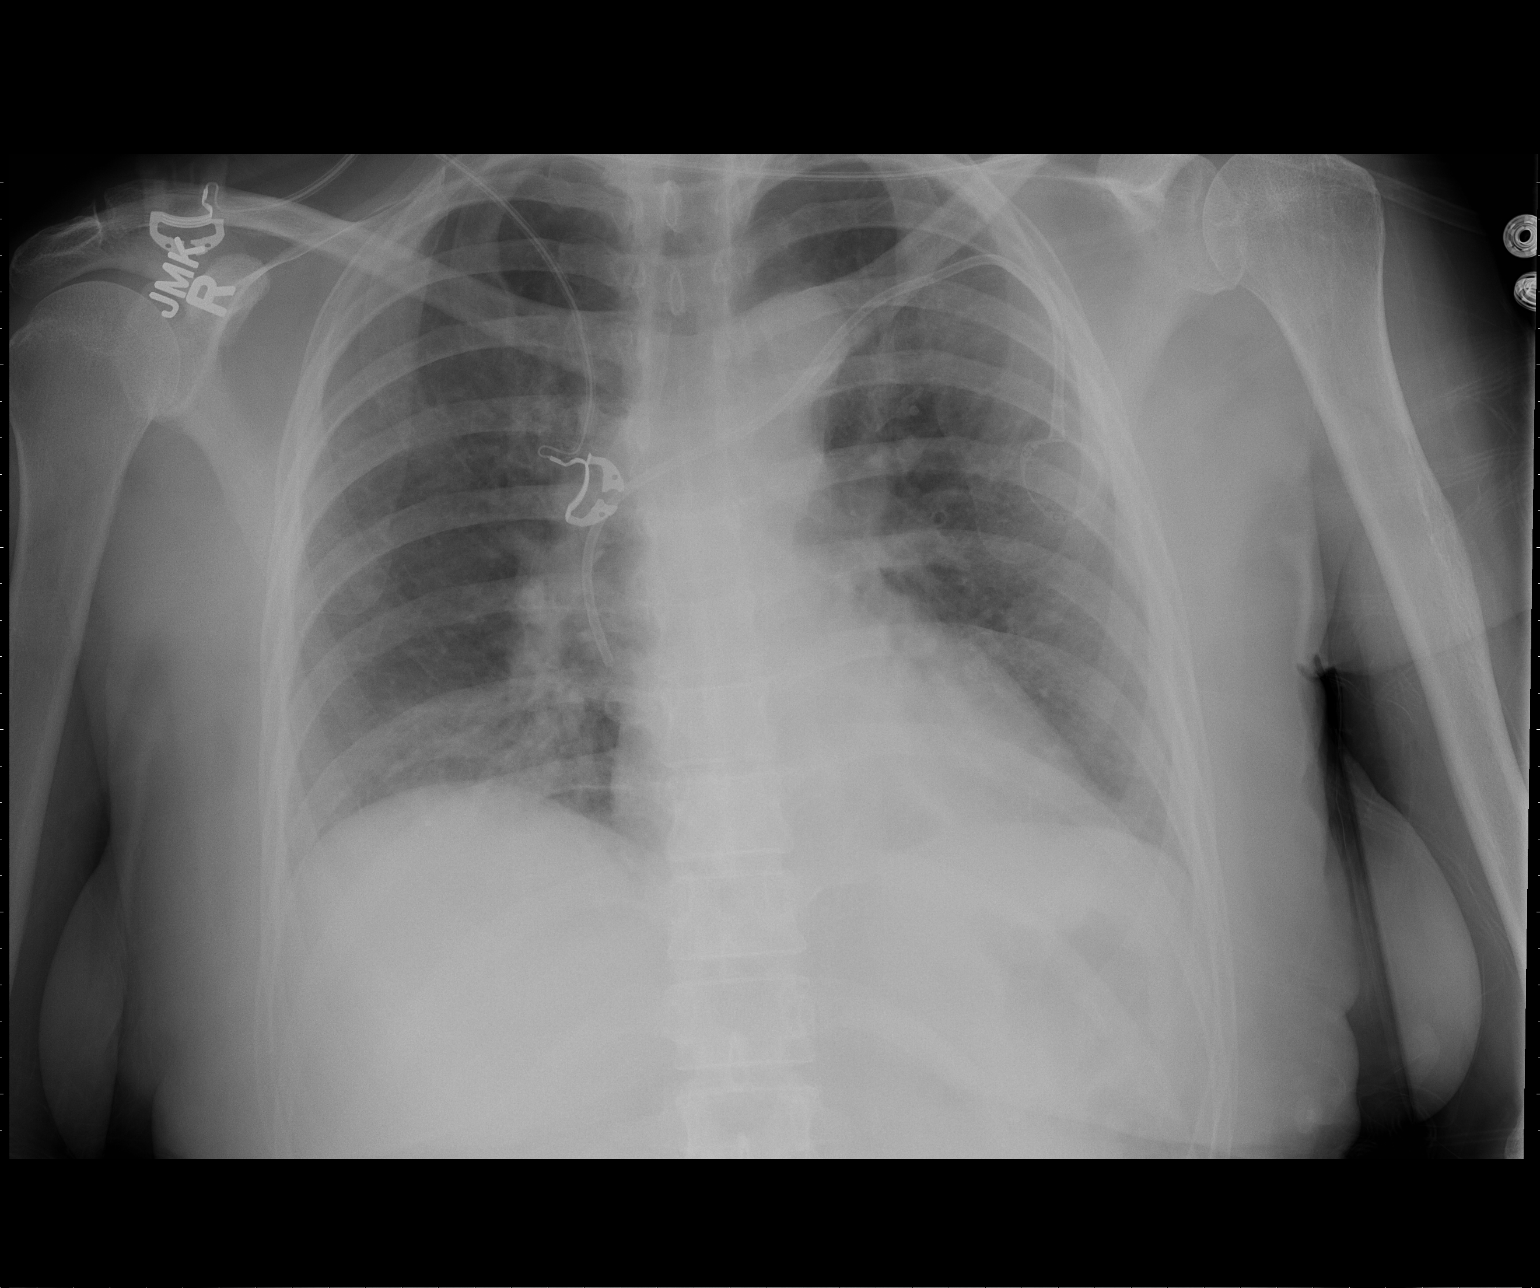

[1 of 1 positions shown; findings below may reference images not displayed]

FINDINGS: A left-sided Port-A-Cath is present with the tip overlying the
expected SVC -RA junction. No pneumothorax is seen. There is mild
haziness at the lung bases which may be due to atelectasis, but
pneumonia cannot be excluded. No pleural effusion is seen. Heart
size is stable.
IMPRESSION: 1. Left-sided Port-A-Cath tip overlies the expected SVC -RA
junction. No pneumothorax.
2. Parenchymal haziness at the lung bases right greater than left.
Possible atelectasis, but pneumonia cannot be excluded.

## 2017-10-30 ENCOUNTER — Other Ambulatory Visit: Payer: Self-pay | Admitting: Family Medicine

## 2017-10-30 DIAGNOSIS — J309 Allergic rhinitis, unspecified: Secondary | ICD-10-CM

## 2017-12-28 ENCOUNTER — Other Ambulatory Visit: Payer: Self-pay | Admitting: Family Medicine

## 2017-12-28 DIAGNOSIS — J309 Allergic rhinitis, unspecified: Secondary | ICD-10-CM

## 2018-01-12 ENCOUNTER — Other Ambulatory Visit: Payer: Self-pay | Admitting: Family Medicine

## 2018-01-12 DIAGNOSIS — J309 Allergic rhinitis, unspecified: Secondary | ICD-10-CM

## 2018-01-19 ENCOUNTER — Other Ambulatory Visit: Payer: Self-pay | Admitting: Family Medicine

## 2018-01-19 DIAGNOSIS — J309 Allergic rhinitis, unspecified: Secondary | ICD-10-CM

## 2019-02-27 ENCOUNTER — Encounter: Payer: Self-pay | Admitting: Gynecology

## 2023-02-08 ENCOUNTER — Other Ambulatory Visit: Payer: Self-pay | Admitting: Pharmacist
